# Patient Record
Sex: Male | Born: 1965 | Race: White | Hispanic: No | Smoking: Former smoker
Health system: Southern US, Community
[De-identification: ages and names within clinical notes are randomized; demographics above are authoritative.]

## PROBLEM LIST (undated history)

## (undated) DIAGNOSIS — F419 Anxiety disorder, unspecified: Secondary | ICD-10-CM

## (undated) DIAGNOSIS — M1990 Unspecified osteoarthritis, unspecified site: Secondary | ICD-10-CM

## (undated) DIAGNOSIS — H9313 Tinnitus, bilateral: Secondary | ICD-10-CM

## (undated) DIAGNOSIS — M719 Bursopathy, unspecified: Secondary | ICD-10-CM

## (undated) DIAGNOSIS — S060X9A Concussion with loss of consciousness of unspecified duration, initial encounter: Secondary | ICD-10-CM

## (undated) DIAGNOSIS — T8859XA Other complications of anesthesia, initial encounter: Secondary | ICD-10-CM

## (undated) DIAGNOSIS — I10 Essential (primary) hypertension: Secondary | ICD-10-CM

## (undated) DIAGNOSIS — S060XAA Concussion with loss of consciousness status unknown, initial encounter: Secondary | ICD-10-CM

## (undated) DIAGNOSIS — T4145XA Adverse effect of unspecified anesthetic, initial encounter: Secondary | ICD-10-CM

## (undated) DIAGNOSIS — S129XXA Fracture of neck, unspecified, initial encounter: Secondary | ICD-10-CM

## (undated) DIAGNOSIS — I4819 Other persistent atrial fibrillation: Secondary | ICD-10-CM

## (undated) DIAGNOSIS — G4700 Insomnia, unspecified: Secondary | ICD-10-CM

## (undated) HISTORY — PX: ANKLE SURGERY: SHX546

## (undated) HISTORY — PX: LEG SURGERY: SHX1003

## (undated) HISTORY — PX: CERVICAL SPINE SURGERY: SHX589

## (undated) HISTORY — DX: Other persistent atrial fibrillation: I48.19

## (undated) HISTORY — PX: KNEE ARTHROSCOPY: SUR90

## (undated) HISTORY — PX: WRIST SURGERY: SHX841

## (undated) HISTORY — PX: OTHER SURGICAL HISTORY: SHX169

## (undated) HISTORY — PX: HIP SURGERY: SHX245

## (undated) HISTORY — DX: Unspecified osteoarthritis, unspecified site: M19.90

## (undated) HISTORY — PX: HAND SURGERY: SHX662

---

## 2016-03-30 ENCOUNTER — Other Ambulatory Visit: Payer: Self-pay | Admitting: Orthopaedic Surgery

## 2016-04-28 ENCOUNTER — Other Ambulatory Visit (HOSPITAL_COMMUNITY): Payer: Self-pay

## 2016-04-28 NOTE — Pre-Procedure Instructions (Signed)
Jadene PieriniScott Nakanishi  04/28/2016     Your procedure is scheduled on : Tuesday May 10, 2016 at 1:10 PM.  [facility] at 11:10 AM.  Call this number if you have problems the morning of surgery: [phone]    Remember:  Do not eat food or drink liquids after midnight.  Take these medicines the morning of surgery with A SIP OF WATER : Amlodipine (Norvasc), Clonidine (Catapres), Hydralazine (Apresoline), Labetaolol (Normodyne), Oxycodone if needed   Stop taking any vitamins, herbal medications/supplements, NSAIDs, Ibuprofen, Advil, Motrin, Aleve/Naproxen, etc on Tuesday August 22nd   Do not wear jewelry.  Do not wear lotions, powders, or cologne.    Men may shave face and neck.  Do not bring valuables to the hospital.  [facility] is not responsible for any belongings or valuables.  Contacts, dentures or bridgework may not be worn into surgery.  Leave your suitcase in the car.  After surgery it may be brought to your room.  For patients admitted to the hospital, discharge time will be determined by your treatment team.  Patients discharged the day of surgery will not be allowed to drive home.   Name and phone number of your driver:    Special instructions:  Shower using CHG soap the night before and the morning of your surgery  Please read over the following fact sheets that you were given. Pain Booklet, Total Joint Packet, MRSA Information and Surgical Site Infection Prevention

## 2016-04-29 ENCOUNTER — Encounter (HOSPITAL_COMMUNITY)
Admission: RE | Admit: 2016-04-29 | Discharge: 2016-04-29 | Disposition: A | Discharge status: Home / Self Care | Payer: Medicaid Other | Source: Ambulatory Visit | Attending: Orthopaedic Surgery | Admitting: Orthopaedic Surgery

## 2016-04-29 ENCOUNTER — Ambulatory Visit (HOSPITAL_COMMUNITY)
Admission: RE | Admit: 2016-04-29 | Discharge: 2016-04-29 | Disposition: A | Discharge status: Home / Self Care | Payer: Medicaid Other | Source: Ambulatory Visit | Attending: Orthopaedic Surgery | Admitting: Orthopaedic Surgery

## 2016-04-29 ENCOUNTER — Encounter (HOSPITAL_COMMUNITY): Payer: Self-pay

## 2016-04-29 ENCOUNTER — Other Ambulatory Visit: Payer: Self-pay

## 2016-04-29 DIAGNOSIS — Z01812 Encounter for preprocedural laboratory examination: Secondary | ICD-10-CM | POA: Diagnosis not present

## 2016-04-29 DIAGNOSIS — R918 Other nonspecific abnormal finding of lung field: Secondary | ICD-10-CM | POA: Insufficient documentation

## 2016-04-29 DIAGNOSIS — I10 Essential (primary) hypertension: Secondary | ICD-10-CM | POA: Insufficient documentation

## 2016-04-29 DIAGNOSIS — Z01818 Encounter for other preprocedural examination: Secondary | ICD-10-CM

## 2016-04-29 DIAGNOSIS — M1612 Unilateral primary osteoarthritis, left hip: Secondary | ICD-10-CM | POA: Diagnosis not present

## 2016-04-29 DIAGNOSIS — F17200 Nicotine dependence, unspecified, uncomplicated: Secondary | ICD-10-CM | POA: Diagnosis not present

## 2016-04-29 DIAGNOSIS — Z79899 Other long term (current) drug therapy: Secondary | ICD-10-CM | POA: Diagnosis not present

## 2016-04-29 DIAGNOSIS — I517 Cardiomegaly: Secondary | ICD-10-CM | POA: Diagnosis not present

## 2016-04-29 DIAGNOSIS — Z0183 Encounter for blood typing: Secondary | ICD-10-CM | POA: Diagnosis not present

## 2016-04-29 HISTORY — DX: Bursopathy, unspecified: M71.9

## 2016-04-29 HISTORY — DX: Anxiety disorder, unspecified: F41.9

## 2016-04-29 HISTORY — DX: Fracture of neck, unspecified, initial encounter: S12.9XXA

## 2016-04-29 HISTORY — DX: Insomnia, unspecified: G47.00

## 2016-04-29 HISTORY — DX: Essential (primary) hypertension: I10

## 2016-04-29 HISTORY — DX: Unspecified osteoarthritis, unspecified site: M19.90

## 2016-04-29 LAB — CBC WITH DIFFERENTIAL/PLATELET
BASOS ABS: 0.1 10*3/uL (ref 0.0–0.1)
Basophils Relative: 2 %
EOS ABS: 0.3 10*3/uL (ref 0.0–0.7)
Eosinophils Relative: 5 %
HCT: 37.2 % — ABNORMAL LOW (ref 39.0–52.0)
HEMOGLOBIN: 13.2 g/dL (ref 13.0–17.0)
LYMPHS PCT: 28 %
Lymphs Abs: 1.6 10*3/uL (ref 0.7–4.0)
MCH: 31.9 pg (ref 26.0–34.0)
MCHC: 35.5 g/dL (ref 30.0–36.0)
MCV: 89.9 fL (ref 78.0–100.0)
MONO ABS: 0.6 10*3/uL (ref 0.1–1.0)
Monocytes Relative: 10 %
NEUTROS ABS: 3.2 10*3/uL (ref 1.7–7.7)
NEUTROS PCT: 55 %
PLATELETS: 216 10*3/uL (ref 150–400)
RBC: 4.14 MIL/uL — ABNORMAL LOW (ref 4.22–5.81)
RDW: 13.2 % (ref 11.5–15.5)
WBC: 5.8 10*3/uL (ref 4.0–10.5)

## 2016-04-29 LAB — BASIC METABOLIC PANEL
ANION GAP: 11 (ref 5–15)
BUN: 9 mg/dL (ref 6–20)
CALCIUM: 9.7 mg/dL (ref 8.9–10.3)
CO2: 24 mmol/L (ref 22–32)
CREATININE: 0.78 mg/dL (ref 0.61–1.24)
Chloride: 100 mmol/L — ABNORMAL LOW (ref 101–111)
Glucose, Bld: 90 mg/dL (ref 65–99)
Potassium: 3.7 mmol/L (ref 3.5–5.1)
Sodium: 135 mmol/L (ref 135–145)

## 2016-04-29 LAB — URINALYSIS, ROUTINE W REFLEX MICROSCOPIC
Bilirubin Urine: NEGATIVE
GLUCOSE, UA: NEGATIVE mg/dL
Hgb urine dipstick: NEGATIVE
KETONES UR: NEGATIVE mg/dL
LEUKOCYTES UA: NEGATIVE
NITRITE: NEGATIVE
PH: 6.5 (ref 5.0–8.0)
Protein, ur: NEGATIVE mg/dL
SPECIFIC GRAVITY, URINE: 1.009 (ref 1.005–1.030)

## 2016-04-29 LAB — PROTIME-INR
INR: 0.94
PROTHROMBIN TIME: 12.6 s (ref 11.4–15.2)

## 2016-04-29 LAB — TYPE AND SCREEN
ABO/RH(D): O POS
Antibody Screen: NEGATIVE

## 2016-04-29 LAB — APTT: APTT: 32 s (ref 24–36)

## 2016-04-29 LAB — SURGICAL PCR SCREEN
MRSA, PCR: NEGATIVE
STAPHYLOCOCCUS AUREUS: POSITIVE — AB

## 2016-04-29 LAB — ABO/RH: ABO/RH(D): O POS

## 2016-04-29 NOTE — Pre-Procedure Instructions (Signed)
Peter Thompson  04/29/2016    [facility] at 11:10 AM  Your procedure is scheduled on : Tuesday May 10, 2016 at 1:10 PM.    Call this number if you have problems the morning of surgery: [phone]              For any other questions, please call [phone], Monday - Friday 8 AM - 4 PM.    Remember:  Do not eat food or drink liquids after midnight.  Take these medicines the morning of surgery with A SIP OF WATER : Amlodipine (Norvasc), Clonidine (Catapres), Hydralazine (Apresoline), Labetaolol (Normodyne), Oxycodone if needed   Stop taking any vitamins, herbal medications/supplements, NSAIDs, Ibuprofen, Advil, Motrin, Aleve/Naproxen, etc on Tuesday August 22nd   Do not wear jewelry.  Do not wear lotions, powders, or cologne.    Men may shave face and neck.  Do not bring valuables to the hospital.  [facility] is not responsible for any belongings or valuables.  Contacts, dentures or bridgework may not be worn into surgery.  Leave your suitcase in the car.  After surgery it may be brought to your room.  For patients admitted to the hospital, discharge time will be determined by your treatment team.  Patients discharged the day of surgery will not be allowed to drive home.   Name and phone number of your driver:    Special instructions:  Shower using CHG soap the night before and the morning of your surgery  Please read over the following fact sheets that you were given. Pain Booklet, Total Joint Packet, MRSA Information and Surgical Site Infection Prevention

## 2016-04-29 NOTE — Progress Notes (Signed)
   04/29/16 1223  OBSTRUCTIVE SLEEP APNEA  Have you ever been diagnosed with sleep apnea through a sleep study? No  Do you snore loudly (loud enough to be heard through closed doors)?  1  Do you often feel tired, fatigued, or sleepy during the daytime (such as falling asleep during driving or talking to someone)? 0  Has anyone observed you stop breathing during your sleep? 0  Do you have, or are you being treated for high blood pressure? 1  BMI more than 35 kg/m2? 1  Age > 50 (1-yes) 0  Neck circumference greater than:Male 16 inches or larger, Male 17inches or larger? 1 45(17.5)  Male Gender (Yes=1) 1  Obstructive Sleep Apnea Score 5  Score 5 or greater  Results sent to PCP

## 2016-04-29 NOTE — Progress Notes (Signed)
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  Patient, "forgets at times" and repeats himself frequently.  Mr Peter Thompson reports that a blood vessel burst in his head in January 2017 and that he lost a large amount of blood from his nose.  Patient denies receiving any blood transfusions or being transferred to another facility.  Mr Labrum's PCP is Dr Rosezena SensorSammie Hussain, [place] , [place]. The PCP 's office is closed today and I was unable to obtain a fax number, we will try again on Monday.

## 2016-04-29 NOTE — Progress Notes (Signed)
I called a prescription for Mupirocin ointment to [facility].

## 2016-05-02 NOTE — Progress Notes (Signed)
Anesthesia Chart Review:  Pt is a 50 year old male scheduled for L total hip arthroplasty anterior approach on 05/10/2016 with Marcene CorningPeter Dalldorf, MD.   PMH includes:  HTN. Current smoker. BMI 35  Medications include: amlodipine, clonidine, hydralazine, labetalol, lisinopril-hctz, potassium.   Preoperative labs reviewed.    chest X-ray 04/29/16: Cardiomegaly with mild pulmonary venous congestion. Basilar pleural-parenchymal thickening noted most likely secondary to scarring.  EKG 04/29/16: Sinus bradycardia (58 bpm)  If no changes, I anticipate pt can proceed with surgery as scheduled.   Rica Mastngela , FNP-BC [facility]/Anesthesiology Phone: [phone] 05/02/2016 4:45 PM

## 2016-05-09 MED ORDER — LACTATED RINGERS IV SOLN
INTRAVENOUS | Status: DC
  Administered 2016-05-10 (×2): via INTRAVENOUS

## 2016-05-09 MED ORDER — CEFAZOLIN SODIUM-DEXTROSE 2-4 GM/100ML-% IV SOLN
2.0000 g | INTRAVENOUS | Status: AC
  Administered 2016-05-10: 2 g via INTRAVENOUS
  Filled 2016-05-09: qty 100

## 2016-05-09 NOTE — H&P (Signed)
TOTAL HIP ADMISSION H&P  Patient is admitted for left total hip arthroplasty.  Subjective:  Chief Complaint: left hip pain  HPI: Peter Thompson, 50 y.o. male, has a history of pain and functional disability in the left hip(s) due to arthritis and patient has failed non-surgical conservative treatments for greater than 12 weeks to include NSAID's and/or analgesics, flexibility and strengthening excercises, use of assistive devices, weight reduction as appropriate and activity modification.  Onset of symptoms was gradual starting 5 years ago with gradually worsening course since that time.The patient noted no past surgery on the left hip(s).  Patient currently rates pain in the left hip at 10 out of 10 with activity. Patient has night pain, worsening of pain with activity and weight bearing, trendelenberg gait, pain that interfers with activities of daily living and crepitus. Patient has evidence of subchondral cysts, subchondral sclerosis, periarticular osteophytes and joint space narrowing by imaging studies. This condition presents safety issues increasing the risk of falls.  There is no current active infection.  There are no active problems to display for this patient.  Past Medical History:  Diagnosis Date  . Anxiety   . Arthritis   . Bursitis   . Hypertension   . Insomnia   . Neck fracture [facility])     Past Surgical History:  Procedure Laterality Date  . Arm surgery Right    fracture repair  . CERVICAL SPINE SURGERY     bone graft from left hip  . HAND SURGERY Left    drains for Infection  . HIP SURGERY Left    "scrapped"  . KNEE ARTHROSCOPY Right   . LEG SURGERY Left    "1 inch took out"  . WRIST SURGERY Right    2 Pinns    No prescriptions prior to admission.   Allergies  Allergen Reactions  . No Known Allergies     Social History  Substance Use Topics  . Smoking status: Current Every Day Smoker    Packs/day: 0.50    Years: 56.00  . Smokeless tobacco: Current User     Types: Chew     Comment: occasional - chew  . Alcohol use Not on file    No family history on file.   Review of Systems  Musculoskeletal: Positive for joint pain.       Left hip  All other systems reviewed and are negative.   Objective:  Physical Exam  Constitutional: He is oriented to person, place, and time. He appears well-developed and well-nourished.  HENT:  Head: Normocephalic and atraumatic.  Eyes: Pupils are equal, round, and reactive to light.  Neck: Normal range of motion.  Cardiovascular: Normal rate and regular rhythm.   Respiratory: Effort normal.  GI: Soft.  Musculoskeletal:  Left hip has really no rotation.  He has good forward flexion with about a 5 hip flexion contracture.  He has 2 scars up near the iliac crest one fairly anterior one posterior.  There are no scars down by his hip joint.  Leg does seem a bit short compared to the opposite side.  Neurological: He is alert and oriented to person, place, and time.  Skin: Skin is warm and dry.  Psychiatric: He has a normal mood and affect. His behavior is normal. Judgment and thought content normal.    Vital signs in last 24 hours:    Labs:   Estimated body mass index is 35.38 kg/m as calculated from the following:   Height as of 04/29/16: 5\' 9"  (  1.753 m).   Weight as of 04/29/16: 108.7 kg (239 lb 9.6 oz).   Imaging Review Plain radiographs demonstrate severe degenerative joint disease of the left hip(s). The bone quality appears to be good for age and reported activity level.  Assessment/Plan:  End stage primary arthritis, left hip(s)  The patient history, physical examination, clinical judgement of the provider and imaging studies are consistent with end stage degenerative joint disease of the left hip(s) and total hip arthroplasty is deemed medically necessary. The treatment options including medical management, injection therapy, arthroscopy and arthroplasty were discussed at length. The risks and  benefits of total hip arthroplasty were presented and reviewed. The risks due to aseptic loosening, infection, stiffness, dislocation/subluxation,  thromboembolic complications and other imponderables were discussed.  The patient acknowledged the explanation, agreed to proceed with the plan and consent was signed. Patient is being admitted for inpatient treatment for surgery, pain control, PT, OT, prophylactic antibiotics, VTE prophylaxis, progressive ambulation and ADL's and discharge planning.The patient is planning to be discharged home with home health services

## 2016-05-10 ENCOUNTER — Inpatient Hospital Stay (HOSPITAL_COMMUNITY)
Admission: RE | Admit: 2016-05-10 | Discharge: 2016-05-12 | DRG: 470 | Disposition: A | Discharge status: Home / Self Care | Payer: Medicaid Other | Source: Ambulatory Visit | Attending: Orthopaedic Surgery | Admitting: Orthopaedic Surgery

## 2016-05-10 ENCOUNTER — Inpatient Hospital Stay (HOSPITAL_COMMUNITY): Payer: Medicaid Other

## 2016-05-10 ENCOUNTER — Inpatient Hospital Stay (HOSPITAL_COMMUNITY): Payer: Medicaid Other | Admitting: Certified Registered"

## 2016-05-10 ENCOUNTER — Encounter (HOSPITAL_COMMUNITY): Payer: Self-pay | Admitting: General Practice

## 2016-05-10 ENCOUNTER — Inpatient Hospital Stay (HOSPITAL_COMMUNITY): Payer: Medicaid Other | Admitting: Emergency Medicine

## 2016-05-10 ENCOUNTER — Encounter (HOSPITAL_COMMUNITY)
Admission: RE | Disposition: A | Discharge status: Home / Self Care | Payer: Self-pay | Source: Ambulatory Visit | Attending: Orthopaedic Surgery

## 2016-05-10 DIAGNOSIS — I10 Essential (primary) hypertension: Secondary | ICD-10-CM | POA: Diagnosis present

## 2016-05-10 DIAGNOSIS — Z79899 Other long term (current) drug therapy: Secondary | ICD-10-CM | POA: Diagnosis not present

## 2016-05-10 DIAGNOSIS — F17210 Nicotine dependence, cigarettes, uncomplicated: Secondary | ICD-10-CM | POA: Diagnosis present

## 2016-05-10 DIAGNOSIS — F419 Anxiety disorder, unspecified: Secondary | ICD-10-CM | POA: Diagnosis present

## 2016-05-10 DIAGNOSIS — M1612 Unilateral primary osteoarthritis, left hip: Principal | ICD-10-CM | POA: Diagnosis present

## 2016-05-10 DIAGNOSIS — Z419 Encounter for procedure for purposes other than remedying health state, unspecified: Secondary | ICD-10-CM

## 2016-05-10 HISTORY — DX: Unilateral primary osteoarthritis, left hip: M16.12

## 2016-05-10 HISTORY — DX: Unspecified osteoarthritis, unspecified site: M19.90

## 2016-05-10 HISTORY — PX: TOTAL HIP ARTHROPLASTY: SHX124

## 2016-05-10 SURGERY — ARTHROPLASTY, HIP, TOTAL, ANTERIOR APPROACH
Anesthesia: Spinal | Site: Hip | Laterality: Left

## 2016-05-10 MED ORDER — ALUM & MAG HYDROXIDE-SIMETH 200-200-20 MG/5ML PO SUSP: 30.0000 mL | ORAL | Status: DC | PRN

## 2016-05-10 MED ORDER — METHOCARBAMOL 500 MG PO TABS
ORAL_TABLET | ORAL | Status: AC
  Administered 2016-05-10: 500 mg
  Filled 2016-05-10: qty 1

## 2016-05-10 MED ORDER — DIPHENHYDRAMINE HCL 12.5 MG/5ML PO ELIX: 12.5000 mg | ORAL_SOLUTION | ORAL | Status: DC | PRN

## 2016-05-10 MED ORDER — ACETAMINOPHEN 650 MG RE SUPP: 650.0000 mg | Freq: Four times a day (QID) | RECTAL | Status: DC | PRN

## 2016-05-10 MED ORDER — BUPIVACAINE LIPOSOME 1.3 % IJ SUSP
20.0000 mL | INTRAMUSCULAR | Status: AC
  Administered 2016-05-10: 20 mL
  Filled 2016-05-10: qty 20

## 2016-05-10 MED ORDER — LIDOCAINE 2% (20 MG/ML) 5 ML SYRINGE
INTRAMUSCULAR | Status: AC
  Filled 2016-05-10: qty 5

## 2016-05-10 MED ORDER — METOCLOPRAMIDE HCL 5 MG PO TABS: 5.0000 mg | ORAL_TABLET | Freq: Three times a day (TID) | ORAL | Status: DC | PRN

## 2016-05-10 MED ORDER — CHLORHEXIDINE GLUCONATE 4 % EX LIQD: 60.0000 mL | Freq: Once | CUTANEOUS | Status: DC

## 2016-05-10 MED ORDER — POTASSIUM CHLORIDE ER 10 MEQ PO TBCR
10.0000 meq | EXTENDED_RELEASE_TABLET | Freq: Every day | ORAL | Status: DC
  Administered 2016-05-10 – 2016-05-12 (×3): 10 meq via ORAL
  Filled 2016-05-10 (×6): qty 1

## 2016-05-10 MED ORDER — MIDAZOLAM HCL 2 MG/2ML IJ SOLN
INTRAMUSCULAR | Status: AC
  Filled 2016-05-10: qty 2

## 2016-05-10 MED ORDER — HYDROMORPHONE HCL 1 MG/ML IJ SOLN
0.5000 mg | INTRAMUSCULAR | Status: DC | PRN
  Administered 2016-05-10 – 2016-05-11 (×4): 1 mg via INTRAVENOUS
  Filled 2016-05-10 (×4): qty 1

## 2016-05-10 MED ORDER — OXYCODONE HCL 5 MG PO TABS
15.0000 mg | ORAL_TABLET | ORAL | Status: DC | PRN
  Administered 2016-05-10 – 2016-05-12 (×12): 30 mg via ORAL
  Filled 2016-05-10 (×12): qty 6

## 2016-05-10 MED ORDER — BUPIVACAINE-EPINEPHRINE (PF) 0.25% -1:200000 IJ SOLN
INTRAMUSCULAR | Status: AC
  Filled 2016-05-10: qty 30

## 2016-05-10 MED ORDER — MIDAZOLAM HCL 5 MG/5ML IJ SOLN
INTRAMUSCULAR | Status: DC | PRN
  Administered 2016-05-10: 2 mg via INTRAVENOUS

## 2016-05-10 MED ORDER — MENTHOL 3 MG MT LOZG: 1.0000 | LOZENGE | OROMUCOSAL | Status: DC | PRN

## 2016-05-10 MED ORDER — AMLODIPINE BESYLATE 10 MG PO TABS
10.0000 mg | ORAL_TABLET | Freq: Every day | ORAL | Status: DC
  Administered 2016-05-10 – 2016-05-12 (×2): 10 mg via ORAL
  Filled 2016-05-10 (×3): qty 1

## 2016-05-10 MED ORDER — PROPOFOL 10 MG/ML IV BOLUS
INTRAVENOUS | Status: DC | PRN
  Administered 2016-05-10: 40 mg via INTRAVENOUS
  Administered 2016-05-10: 30 mg via INTRAVENOUS
  Administered 2016-05-10 (×3): 20 mg via INTRAVENOUS

## 2016-05-10 MED ORDER — ONDANSETRON HCL 4 MG PO TABS: 4.0000 mg | ORAL_TABLET | Freq: Four times a day (QID) | ORAL | Status: DC | PRN

## 2016-05-10 MED ORDER — DEXTROSE 5 % IV SOLN
INTRAVENOUS | Status: DC | PRN
  Administered 2016-05-10: 10 ug/min via INTRAVENOUS

## 2016-05-10 MED ORDER — FENTANYL CITRATE (PF) 100 MCG/2ML IJ SOLN
INTRAMUSCULAR | Status: AC
  Filled 2016-05-10: qty 2

## 2016-05-10 MED ORDER — LISINOPRIL-HYDROCHLOROTHIAZIDE 20-25 MG PO TABS: 1.0000 | ORAL_TABLET | Freq: Every day | ORAL | Status: DC

## 2016-05-10 MED ORDER — ALPRAZOLAM 0.5 MG PO TABS
1.0000 mg | ORAL_TABLET | Freq: Every day | ORAL | Status: DC
  Administered 2016-05-10 – 2016-05-11 (×2): 1 mg via ORAL
  Filled 2016-05-10 (×2): qty 2

## 2016-05-10 MED ORDER — SODIUM CHLORIDE 0.9 % IV SOLN
1000.0000 mg | Freq: Once | INTRAVENOUS | Status: AC
  Administered 2016-05-10: 1000 mg via INTRAVENOUS
  Filled 2016-05-10: qty 10

## 2016-05-10 MED ORDER — LACTATED RINGERS IV SOLN: INTRAVENOUS | Status: DC

## 2016-05-10 MED ORDER — ONDANSETRON HCL 4 MG/2ML IJ SOLN
INTRAMUSCULAR | Status: DC | PRN
  Administered 2016-05-10: 4 mg via INTRAVENOUS

## 2016-05-10 MED ORDER — BISACODYL 5 MG PO TBEC: 5.0000 mg | DELAYED_RELEASE_TABLET | Freq: Every day | ORAL | Status: DC | PRN

## 2016-05-10 MED ORDER — ONDANSETRON HCL 4 MG/2ML IJ SOLN
INTRAMUSCULAR | Status: AC
  Filled 2016-05-10: qty 2

## 2016-05-10 MED ORDER — CLONIDINE HCL 0.2 MG PO TABS
0.2000 mg | ORAL_TABLET | Freq: Three times a day (TID) | ORAL | Status: DC
  Administered 2016-05-10 – 2016-05-12 (×4): 0.2 mg via ORAL
  Filled 2016-05-10 (×5): qty 1

## 2016-05-10 MED ORDER — SODIUM CHLORIDE 0.9 % IV SOLN
INTRAVENOUS | Status: DC | PRN
  Administered 2016-05-10: 2000 mg via TOPICAL

## 2016-05-10 MED ORDER — LISINOPRIL 20 MG PO TABS
20.0000 mg | ORAL_TABLET | Freq: Every day | ORAL | Status: DC
  Administered 2016-05-10 – 2016-05-12 (×2): 20 mg via ORAL
  Filled 2016-05-10 (×3): qty 1

## 2016-05-10 MED ORDER — HYDROMORPHONE HCL 1 MG/ML IJ SOLN
INTRAMUSCULAR | Status: AC
  Filled 2016-05-10: qty 1

## 2016-05-10 MED ORDER — METHOCARBAMOL 1000 MG/10ML IJ SOLN
500.0000 mg | Freq: Four times a day (QID) | INTRAVENOUS | Status: DC | PRN
  Filled 2016-05-10: qty 5

## 2016-05-10 MED ORDER — HYDROCHLOROTHIAZIDE 25 MG PO TABS
25.0000 mg | ORAL_TABLET | Freq: Every day | ORAL | Status: DC
  Administered 2016-05-10 – 2016-05-12 (×2): 25 mg via ORAL
  Filled 2016-05-10 (×3): qty 1

## 2016-05-10 MED ORDER — PROPOFOL 1000 MG/100ML IV EMUL
INTRAVENOUS | Status: AC
  Filled 2016-05-10: qty 300

## 2016-05-10 MED ORDER — ONDANSETRON HCL 4 MG/2ML IJ SOLN: 4.0000 mg | Freq: Four times a day (QID) | INTRAMUSCULAR | Status: DC | PRN

## 2016-05-10 MED ORDER — FENTANYL CITRATE (PF) 100 MCG/2ML IJ SOLN
50.0000 ug | Freq: Once | INTRAMUSCULAR | Status: AC
  Administered 2016-05-10: 50 ug via INTRAVENOUS

## 2016-05-10 MED ORDER — OXYCODONE HCL 5 MG PO TABS
ORAL_TABLET | ORAL | Status: AC
  Filled 2016-05-10: qty 6

## 2016-05-10 MED ORDER — FENTANYL CITRATE (PF) 100 MCG/2ML IJ SOLN
INTRAMUSCULAR | Status: DC | PRN
  Administered 2016-05-10 (×2): 50 ug via INTRAVENOUS

## 2016-05-10 MED ORDER — HYDRALAZINE HCL 50 MG PO TABS
100.0000 mg | ORAL_TABLET | Freq: Three times a day (TID) | ORAL | Status: DC
  Administered 2016-05-10 – 2016-05-12 (×4): 100 mg via ORAL
  Filled 2016-05-10 (×5): qty 2

## 2016-05-10 MED ORDER — ACETAMINOPHEN 325 MG PO TABS: 650.0000 mg | ORAL_TABLET | Freq: Four times a day (QID) | ORAL | Status: DC | PRN

## 2016-05-10 MED ORDER — PROPOFOL 500 MG/50ML IV EMUL
INTRAVENOUS | Status: DC | PRN
  Administered 2016-05-10: 100 ug/kg/min via INTRAVENOUS

## 2016-05-10 MED ORDER — LIDOCAINE HCL (CARDIAC) 20 MG/ML IV SOLN
INTRAVENOUS | Status: DC | PRN
  Administered 2016-05-10 (×2): 20 mg via INTRAVENOUS

## 2016-05-10 MED ORDER — CEFAZOLIN SODIUM-DEXTROSE 2-4 GM/100ML-% IV SOLN
2.0000 g | Freq: Four times a day (QID) | INTRAVENOUS | Status: AC
  Administered 2016-05-11: 2 g via INTRAVENOUS
  Filled 2016-05-10: qty 100

## 2016-05-10 MED ORDER — ASPIRIN EC 325 MG PO TBEC
325.0000 mg | DELAYED_RELEASE_TABLET | Freq: Two times a day (BID) | ORAL | Status: DC
  Administered 2016-05-11 – 2016-05-12 (×4): 325 mg via ORAL
  Filled 2016-05-10 (×4): qty 1

## 2016-05-10 MED ORDER — DOCUSATE SODIUM 100 MG PO CAPS
100.0000 mg | ORAL_CAPSULE | Freq: Two times a day (BID) | ORAL | Status: DC
  Administered 2016-05-10 – 2016-05-12 (×4): 100 mg via ORAL
  Filled 2016-05-10 (×4): qty 1

## 2016-05-10 MED ORDER — LABETALOL HCL 300 MG PO TABS
150.0000 mg | ORAL_TABLET | Freq: Two times a day (BID) | ORAL | Status: DC
  Administered 2016-05-10 – 2016-05-12 (×3): 150 mg via ORAL
  Filled 2016-05-10 (×5): qty 0.5

## 2016-05-10 MED ORDER — CEFAZOLIN SODIUM-DEXTROSE 2-4 GM/100ML-% IV SOLN
2.0000 g | Freq: Four times a day (QID) | INTRAVENOUS | Status: DC
  Administered 2016-05-10: 2 g via INTRAVENOUS
  Filled 2016-05-10 (×2): qty 100

## 2016-05-10 MED ORDER — METHOCARBAMOL 500 MG PO TABS
500.0000 mg | ORAL_TABLET | Freq: Four times a day (QID) | ORAL | Status: DC | PRN
  Administered 2016-05-10 – 2016-05-12 (×6): 500 mg via ORAL
  Filled 2016-05-10 (×6): qty 1

## 2016-05-10 MED ORDER — PHENOL 1.4 % MT LIQD: 1.0000 | OROMUCOSAL | Status: DC | PRN

## 2016-05-10 MED ORDER — METOCLOPRAMIDE HCL 5 MG/ML IJ SOLN: 5.0000 mg | Freq: Three times a day (TID) | INTRAMUSCULAR | Status: DC | PRN

## 2016-05-10 MED ORDER — HYDROMORPHONE HCL 1 MG/ML IJ SOLN
0.5000 mg | INTRAMUSCULAR | Status: DC | PRN
  Administered 2016-05-10 (×4): 0.5 mg via INTRAVENOUS

## 2016-05-10 MED ORDER — TRANEXAMIC ACID 1000 MG/10ML IV SOLN
2000.0000 mg | INTRAVENOUS | Status: DC
  Filled 2016-05-10: qty 20

## 2016-05-10 MED ORDER — BUPIVACAINE-EPINEPHRINE (PF) 0.5% -1:200000 IJ SOLN: INTRAMUSCULAR | Status: DC | PRN

## 2016-05-10 MED ORDER — 0.9 % SODIUM CHLORIDE (POUR BTL) OPTIME
TOPICAL | Status: DC | PRN
Start: 2016-05-10 — End: 2016-05-10
  Administered 2016-05-10: 1000 mL

## 2016-05-10 MED ORDER — BUPIVACAINE IN DEXTROSE 0.75-8.25 % IT SOLN
INTRATHECAL | Status: DC | PRN
  Administered 2016-05-10: 2 mL via INTRATHECAL

## 2016-05-10 MED ORDER — TRANEXAMIC ACID 1000 MG/10ML IV SOLN
1000.0000 mg | INTRAVENOUS | Status: AC
  Administered 2016-05-10: 1000 mg via INTRAVENOUS
  Filled 2016-05-10: qty 10

## 2016-05-10 MED ORDER — BUPIVACAINE-EPINEPHRINE 0.25% -1:200000 IJ SOLN
INTRAMUSCULAR | Status: DC | PRN
  Administered 2016-05-10: 20 mL

## 2016-05-10 SURGICAL SUPPLY — 51 items
BLADE SAW SGTL 18X1.27X75 (BLADE) ×2 IMPLANT
BLADE SAW SGTL 18X1.27X75MM (BLADE) ×1
BLADE SURG ROTATE 9660 (MISCELLANEOUS) IMPLANT
CAPT HIP TOTAL 2 ×3 IMPLANT
CELLS DAT CNTRL 66122 CELL SVR (MISCELLANEOUS) ×1 IMPLANT
CLOSURE WOUND 1/2 X4 (GAUZE/BANDAGES/DRESSINGS) ×1
COVER PERINEAL POST (MISCELLANEOUS) ×3 IMPLANT
COVER SURGICAL LIGHT HANDLE (MISCELLANEOUS) ×3 IMPLANT
DRAPE C-ARM 42X72 X-RAY (DRAPES) ×3 IMPLANT
DRAPE IMP U-DRAPE 54X76 (DRAPES) ×3 IMPLANT
DRAPE STERI IOBAN 125X83 (DRAPES) ×3 IMPLANT
DRAPE U-SHAPE 47X51 STRL (DRAPES) ×9 IMPLANT
DRSG AQUACEL AG ADV 3.5X10 (GAUZE/BANDAGES/DRESSINGS) ×3 IMPLANT
DURAPREP 26ML APPLICATOR (WOUND CARE) ×3 IMPLANT
ELECT BLADE 4.0 EZ CLEAN MEGAD (MISCELLANEOUS) ×3
ELECT CAUTERY BLADE 6.4 (BLADE) ×3 IMPLANT
ELECT REM PT RETURN 9FT ADLT (ELECTROSURGICAL) ×3
ELECTRODE BLDE 4.0 EZ CLN MEGD (MISCELLANEOUS) ×1 IMPLANT
ELECTRODE REM PT RTRN 9FT ADLT (ELECTROSURGICAL) ×1 IMPLANT
FACESHIELD WRAPAROUND (MASK) ×6 IMPLANT
GLOVE BIO SURGEON STRL SZ8 (GLOVE) ×6 IMPLANT
GLOVE BIOGEL PI IND STRL 8 (GLOVE) ×2 IMPLANT
GLOVE BIOGEL PI INDICATOR 8 (GLOVE) ×4
GOWN STRL REUS W/ TWL LRG LVL3 (GOWN DISPOSABLE) ×1 IMPLANT
GOWN STRL REUS W/ TWL XL LVL3 (GOWN DISPOSABLE) ×2 IMPLANT
GOWN STRL REUS W/TWL LRG LVL3 (GOWN DISPOSABLE) ×2
GOWN STRL REUS W/TWL XL LVL3 (GOWN DISPOSABLE) ×4
KIT BASIN OR (CUSTOM PROCEDURE TRAY) ×3 IMPLANT
KIT ROOM TURNOVER OR (KITS) ×3 IMPLANT
MANIFOLD NEPTUNE II (INSTRUMENTS) ×3 IMPLANT
NEEDLE HYPO 22GX1.5 SAFETY (NEEDLE) ×3 IMPLANT
NS IRRIG 1000ML POUR BTL (IV SOLUTION) ×3 IMPLANT
PACK TOTAL JOINT (CUSTOM PROCEDURE TRAY) ×3 IMPLANT
PAD ARMBOARD 7.5X6 YLW CONV (MISCELLANEOUS) ×6 IMPLANT
RTRCTR WOUND ALEXIS 18CM MED (MISCELLANEOUS) ×3
STAPLER VISISTAT 35W (STAPLE) ×3 IMPLANT
STRIP CLOSURE SKIN 1/2X4 (GAUZE/BANDAGES/DRESSINGS) ×2 IMPLANT
SUT ETHIBOND NAB CT1 #1 30IN (SUTURE) ×6 IMPLANT
SUT MNCRL AB 3-0 PS2 18 (SUTURE) ×3 IMPLANT
SUT VIC AB 0 CT1 27 (SUTURE)
SUT VIC AB 0 CT1 27XBRD ANBCTR (SUTURE) IMPLANT
SUT VIC AB 1 CT1 27 (SUTURE) ×2
SUT VIC AB 1 CT1 27XBRD ANBCTR (SUTURE) ×1 IMPLANT
SUT VIC AB 2-0 CT1 27 (SUTURE) ×2
SUT VIC AB 2-0 CT1 TAPERPNT 27 (SUTURE) ×1 IMPLANT
SUT VLOC 180 0 24IN GS25 (SUTURE) ×3 IMPLANT
SYR 50ML LL SCALE MARK (SYRINGE) ×3 IMPLANT
TOWEL OR 17X24 6PK STRL BLUE (TOWEL DISPOSABLE) ×3 IMPLANT
TOWEL OR 17X26 10 PK STRL BLUE (TOWEL DISPOSABLE) ×6 IMPLANT
TRAY FOLEY CATH 14FR (SET/KITS/TRAYS/PACK) IMPLANT
WATER STERILE IRR 1000ML POUR (IV SOLUTION) IMPLANT

## 2016-05-10 NOTE — Transfer of Care (Addendum)
Immediate Anesthesia Transfer of Care Note  Patient: Peter Thompson  Procedure(s) Performed: Procedure(s): TOTAL HIP ARTHROPLASTY ANTERIOR APPROACH (Left)  Patient Location: PACU  Anesthesia Type:Spinal  Level of Consciousness:  sedated, patient cooperative and responds to stimulation  Airway & Oxygen Therapy:Patient Spontanous Breathing and Patient connected to face mask oxgen  Post-op Assessment:  Report given to PACU RN and Post -op Vital signs reviewed and stable  Post vital signs:  Reviewed and stable  Last Vitals:  Vitals:   05/10/16 0936  BP: 121/65  Pulse: 71  Resp: 20  Temp: 37.1 C    Complications: No apparent anesthesia complications

## 2016-05-10 NOTE — Interval H&P Note (Signed)
History and Physical Interval Note:  05/10/2016 11:18 AM  Peter PieriniScott Thompson  has presented today for surgery, with the diagnosis of LEFT HIP DEGENERATIVE JOINT DISEASE  The various methods of treatment have been discussed with the patient and family. After consideration of risks, benefits and other options for treatment, the patient has consented to  Procedure(s): TOTAL HIP ARTHROPLASTY ANTERIOR APPROACH (Left) as a surgical intervention .  The patient's history has been reviewed, patient examined, no change in status, stable for surgery.  I have reviewed the patient's chart and labs.  Questions were answered to the patient's satisfaction.     , G

## 2016-05-10 NOTE — Anesthesia Procedure Notes (Signed)
Spinal  Patient location during procedure: OR Start time: 05/10/2016 11:48 AM End time: 05/10/2016 11:50 AM Staffing Anesthesiologist: Suella Broad D Performed: anesthesiologist  Preanesthetic Checklist Completed: patient identified, site marked, surgical consent, pre-op evaluation, timeout performed, IV checked, risks and benefits discussed and monitors and equipment checked Spinal Block Patient position: sitting Prep: Betadine Patient monitoring: heart rate, continuous pulse ox, blood pressure and cardiac monitor Approach: midline Location: L4-5 Injection technique: single-shot Needle Needle type: Whitacre and Introducer  Needle gauge: 24 G Needle length: 9 cm Additional Notes Negative paresthesia. Negative blood return. Positive free-flowing CSF. Expiration date of kit checked and confirmed. Patient tolerated procedure well, without complications.

## 2016-05-10 NOTE — Op Note (Signed)
PRE-OP DIAGNOSIS:  LEFT HIP DEGENERATIVE JOINT DISEASE POST-OP DIAGNOSIS: same PROCEDURE:  LEFT TOTAL HIP ARTHROPLASTY ANTERIOR APPROACH ANESTHESIA:  Spinal and MAC SURGEON:  Marcene CorningPeter  MD ASSISTANT:  Elodia FlorenceAndrew Nida PA-C   INDICATIONS FOR PROCEDURE:  The patient is a 50 y.o. male with a long history of a painful hip.  This has persisted despite multiple conservative measures.  The patient has persisted with pain and dysfunction making rest and activity difficult.  A total hip replacement is offered as surgical treatment.  Informed operative consent was obtained after discussion of possible complications including reaction to anesthesia, infection, neurovascular injury, dislocation, DVT, PE, and death.  The importance of the postoperative rehab program to optimize result was stressed with the patient.  SUMMARY OF FINDINGS AND PROCEDURE:  Under general anesthesia through a anterior approach an the Hana table a left THR was performed.  The patient had severe degenerative change and excellent bone quality.  We used DePuy components to replace the hip and these were size KLA 13 Corail femur capped with a +12 36 mm ceramic hip ball.  On the acetabular side we used a size 54 Gription shell including 3 holes which I did not utilize with a plus 4 neutral polyethylene liner.  We did not use a hole eliminator.  Elodia FlorenceAndrew Nida PA-C assisted throughout and was invaluable to the completion of the case in that he helped position and retract while I performed the procedure.  He also closed simultaneously to help minimize OR time.  I used fluoroscopy throughout the case to check position of components and leg lengths and read all these views myself.  DESCRIPTION OF PROCEDURE:  The patient was taken to the OR suite where general anesthetic was applied.  The patient was then positioned on the Hana table supine.  All bony prominences were appropriately padded.  Prep and drape was then performed in normal sterile fashion.  The  patient was given kefzol preoperative antibiotic and an appropriate time out was performed.  We then took an anterior approach to the left hip.  Dissection was taken through adipose to the tensor fascia lata fascia.  This structure was incised longitudinally and we dissected in the intermuscular interval just medial to this muscle.  Cobra retractors were placed superior and inferior to the femoral neck superficial to the capsule.  A capsular incision was then made and the retractors were placed along the femoral neck.  Xray was brought in to get a good level for the femoral neck cut which was made with an oscillating saw and osteotome.  The femoral head was removed with a corkscrew.  The acetabulum was exposed and some labral tissues were excised. Reaming was taken to the inside wall of the pelvis and sequentially up to 1 mm smaller than the actual component.  A trial of components was done and then the aforementioned acetabular shell was placed in appropriate tilt and anteversion confirmed by fluoroscopy. The liner was placed along with the hole eliminator and attention was turned to the femur.  The leg was brought down and over into adduction and the elevator bar was used to raise the femur up gently in the wound.  The piriformis was released with care taken to preserve the obturator internus attachment and all of the posterior capsule. The femur was reamed and then broached to the appropriate size.  A trial reduction was done and the aforementioned head and neck assembly gave us the best stability in extension with external rotation.  Leg  lengths were felt to be about equal by fluoroscopic exam.  The trial components were removed and the wound irrigated.  We then placed the femoral component in appropriate anteversion.  The head was applied to a dry stem neck and the hip again reduced.  It was again stable in the aforementioned position.  The would was irrigated again followed by re-approximation of anterior  capsule with ethibond suture. Tensor fascia was repaired with V-loc suture  followed by deep closure with #O and #2 undyed vicryl.  Skin was closed with subQ stitch and steristrips followed by a sterile dressing.  EBL and IOF can be obtained from anesthesia records.  DISPOSITION:  The patient was extubated in the OR and taken to PACU in stable condition to be admitted to the Orthopedic Surgery for appropriate post-op care to include perioperative antibiotics and DVT prophylaxis.

## 2016-05-10 NOTE — Anesthesia Preprocedure Evaluation (Addendum)
Anesthesia Evaluation  Patient identified by MRN, date of birth, ID band Patient awake    Reviewed: Allergy & Precautions, NPO status , Patient's Chart, lab work & pertinent test results, reviewed documented beta blocker date and time   Airway Mallampati: II  TM Distance: >3 FB Neck ROM: Full    Dental  (+) Teeth Intact, Dental Advisory Given   Pulmonary Current Smoker,    breath sounds clear to auscultation       Cardiovascular hypertension, Pt. on home beta blockers and Pt. on medications  Rhythm:Regular Rate:Normal     Neuro/Psych PSYCHIATRIC DISORDERS Anxiety negative neurological ROS     GI/Hepatic negative GI ROS, Neg liver ROS,   Endo/Other  negative endocrine ROS  Renal/GU negative Renal ROS  negative genitourinary   Musculoskeletal  (+) Arthritis ,   Abdominal   Peds negative pediatric ROS (+)  Hematology negative hematology ROS (+)   Anesthesia Other Findings   Reproductive/Obstetrics negative OB ROS                            Anesthesia Physical Anesthesia Plan  ASA: III  Anesthesia Plan: Spinal   Post-op Pain Management:    Induction: Intravenous  Airway Management Planned: Natural Airway  Additional Equipment:   Intra-op Plan:   Post-operative Plan:   Informed Consent: I have reviewed the patients History and Physical, chart, labs and discussed the procedure including the risks, benefits and alternatives for the proposed anesthesia with the patient or authorized representative who has indicated his/her understanding and acceptance.     Plan Discussed with: CRNA  Anesthesia Plan Comments:        Anesthesia Quick Evaluation

## 2016-05-10 NOTE — Progress Notes (Signed)
Patient's last dose of Naproxen was in the morning on 05/09/16.  Patient states that until yesterday, he has been taking it twice a day.  Dr. Jerl Santosalldorf made aware.

## 2016-05-10 NOTE — Anesthesia Procedure Notes (Deleted)
Anesthesia Regional Block:  Adductor canal block  Pre-Anesthetic Checklist: ,, timeout performed, Correct Patient, Correct Site, Correct Laterality, Correct Procedure, Correct Position, site marked, Risks and benefits discussed,  Surgical consent,  Pre-op evaluation,  At surgeon's request and post-op pain management  Laterality: Left  Prep: chloraprep       Needles:  Injection technique: Single-shot  Needle Type: Echogenic Needle     Needle Length: 9cm 9 cm Needle Gauge: 21 and 21 G    Additional Needles:  Procedures: ultrasound guided (picture in chart) Adductor canal block Narrative:  Start time: 05/10/2016 10:46 AM End time: 05/10/2016 10:50 AM Injection made incrementally with aspirations every 5 mL.  Performed by: Personally  Anesthesiologist: Shona SimpsonHOLLIS, KEVIN D  Additional Notes: Pt tolerated well. No immediate complications noted.

## 2016-05-11 ENCOUNTER — Encounter (HOSPITAL_COMMUNITY): Payer: Self-pay | Admitting: General Practice

## 2016-05-11 NOTE — Progress Notes (Signed)
   05/11/16 1124  OT Visit Information  Last OT Received On 05/11/16  Assistance Needed +1  History of Present Illness Pt is a 50 y/o male s/p L THA, anterior approach. PMH including but not limited to HTN.  Precautions  Precautions Fall  Pain Assessment  Pain Assessment Faces  Faces Pain Scale 2  Pain Location L hip  Pain Intervention(s) Limited activity within patient's tolerance;Monitored during session;Premedicated before session;Repositioned  Cognition  Arousal/Alertness Awake/alert  Behavior During Therapy WFL for tasks assessed/performed  Overall Cognitive Status Within Functional Limits for tasks assessed  ADL  Lower Body Bathing Supervison/ safety;Sit to/from stand;With adaptive equipment  Lower Body Dressing Minimal assistance;Sit to/from stand;With adaptive equipment  General ADL Comments issued AE kit (minus sock aide, as pt did not feel he would use it).  Brought him to gym and showed options for use of 3:1 over tub.  He has a small tub and doesn't feel a tub bench will work nor placing 3:1 parallel to long side of tub.  He feels he may be able to straddle 3:1 over ledge.  Also educated on tub readiness. Wife arrived and was able to see the tub portion of this session  Restrictions  LLE Weight Bearing WBAT  Transfers  Equipment used Rolling walker (2 wheeled)  Sit to Stand Supervision  OT - End of Session  Activity Tolerance Patient tolerated treatment well  OT Assessment/Plan  Follow Up Recommendations Supervision - Intermittent  OT Equipment 3 in 1 bedside comode  OT Goal Progression  Progress towards OT goals Goals met/education completed, patient discharged from OT  Acute Rehab OT Goals  Patient Stated Goal return home  ADL Goals  Pt Will Perform Lower Body Bathing with supervision;with adaptive equipment;sit to/from stand  Pt Will Perform Lower Body Dressing with supervision;with adaptive equipment;sit to/from stand  Additional ADL Goal #1 pt will verbalize tub  options (3:1 vs tub bench/seat) vs readiness for stepping inside  OT Time Calculation  OT Start Time (ACUTE ONLY) 1132  OT Stop Time (ACUTE ONLY) 1152  OT Time Calculation (min) 20 min  OT General Charges  $OT Visit 1 Procedure  OT Treatments  $Self Care/Home Management  8-22 mins  Lesle Chris, OTR/L [phone] 05/11/2016

## 2016-05-11 NOTE — Progress Notes (Signed)
Subjective: 1 Day Post-Op Procedure(s) (LRB): TOTAL HIP ARTHROPLASTY ANTERIOR APPROACH (Left)  Activity level:  wbat Diet tolerance:  ok Voiding:  ok Patient reports pain as mild and moderate.    Objective: Vital signs in last 24 hours: Temp:  [97.4 F (36.3 C)-98.8 F (37.1 C)] 98.4 F (36.9 C) (08/30 0420) Pulse Rate:  [52-71] 63 (08/30 0420) Resp:  [10-20] 18 (08/30 0420) BP: (108-155)/(57-76) 109/58 (08/30 0420) SpO2:  [96 %-100 %] 99 % (08/30 0420) Weight:  [108.7 kg (239 lb 9.6 oz)] 108.7 kg (239 lb 9.6 oz) (08/29 0936)  Labs: No results for input(s): HGB in the last 72 hours. No results for input(s): WBC, RBC, HCT, PLT in the last 72 hours. No results for input(s): NA, K, CL, CO2, BUN, CREATININE, GLUCOSE, CALCIUM in the last 72 hours. No results for input(s): LABPT, INR in the last 72 hours.  Physical Exam:  Neurologically intact ABD soft Neurovascular intact Sensation intact distally Intact pulses distally Dorsiflexion/Plantar flexion intact Incision: dressing C/D/I and no drainage No cellulitis present Compartment soft  Assessment/Plan:  1 Day Post-Op Procedure(s) (LRB): TOTAL HIP ARTHROPLASTY ANTERIOR APPROACH (Left) Advance diet Up with therapy D/C IV fluids Plan for discharge tomorrow Discharge home with home health if doing well and cleared by PT. Continue on ASA 325 mg BID x 4 weeks post op. Follow up in office 2 weeks  , Ginger OrganNDREW [name] 05/11/2016, 7:46 AM

## 2016-05-11 NOTE — Evaluation (Signed)
Physical Therapy Evaluation Patient Details Name: Peter Thompson MRN: [id number] DOB: 27-Nov-1965 Today's Date: 05/11/2016   History of Present Illness  Pt is a 50 y/o male s/p L THA, anterior approach. PMH including but not limited to HTN.  Clinical Impression  Pt presented standing upright in front of his recliner in his room when PT entered. Pt was awake and willing to participate in therapy session. Pt able to perform STS with supervision and ambulate with min guard for safety, no physical assist needed. Pt would continue to benefit from skilled physical therapy services at this time while admitted and after d/c to address his below listed limitations in order to improve his overall safety and independence with functional mobility.      Follow Up Recommendations Home health PT;Supervision for mobility/OOB    Equipment Recommendations  Rolling walker with 5" wheels;3in1 (PT)    Recommendations for Other Services       Precautions / Restrictions Precautions Precautions: Fall Restrictions Weight Bearing Restrictions: Yes LLE Weight Bearing: Weight bearing as tolerated      Mobility  Bed Mobility               General bed mobility comments: pt standing up in front of recliner when PT entered room  Transfers Overall transfer level: Needs assistance Equipment used: Rolling walker (2 wheeled) Transfers: Sit to/from Stand Sit to Stand: Supervision         General transfer comment: pt required increased time  Ambulation/Gait Ambulation/Gait assistance: Min guard Ambulation Distance (Feet): 150 Feet Assistive device: Rolling walker (2 wheeled) Gait Pattern/deviations: Step-through pattern;Decreased step length - right;Decreased stance time - left;Decreased weight shift to left Gait velocity: decreased Gait velocity interpretation: Below normal speed for age/gender    Stairs            Wheelchair Mobility    Modified Rankin (Stroke Patients Only)        Balance Overall balance assessment: Needs assistance Sitting-balance support: Feet supported;No upper extremity supported Sitting balance-Leahy Scale: Fair     Standing balance support: During functional activity;No upper extremity supported Standing balance-Leahy Scale: Fair                               Pertinent Vitals/Pain Pain Assessment: Faces Pain Score: 6  Faces Pain Scale: Hurts a little bit Pain Location: L hip; baseline pain in multiple areas of body Pain Descriptors / Indicators: Grimacing Pain Intervention(s): Monitored during session;Repositioned;Patient requesting pain meds-RN notified    Home Living Family/patient expects to be discharged to:: Private residence Living Arrangements: Spouse/significant other Available Help at Discharge: Family;Available PRN/intermittently Type of Home: Mobile home Home Access: Stairs to enter Entrance Stairs-Rails: Can reach both Entrance Stairs-Number of Steps: 4 Home Layout: One level Home Equipment: Crutches Additional Comments: pt states wife will help him; she works 2nd shift.  He will be alone then    Prior Function Level of Independence: Independent with assistive device(s)         Comments: Pt reported that he was ambulating with use of single axillary crutch since December secondary to pain.     Hand Dominance        Extremity/Trunk Assessment   Upper Extremity Assessment: Defer to OT evaluation           Lower Extremity Assessment: LLE deficits/detail   LLE Deficits / Details: pt with decreased strength and ROM limitations secondary to post-op.  Communication   Communication: No difficulties  Cognition Arousal/Alertness: Awake/alert Behavior During Therapy: WFL for tasks assessed/performed Overall Cognitive Status: Within Functional Limits for tasks assessed                      General Comments      Exercises Total Joint Exercises Quad Sets:  AROM;Strengthening;Left;10 reps;Seated Heel Slides: AAROM;Left;5 reps;Seated Marching in Standing: AROM;Strengthening;Both;10 reps;Seated      Assessment/Plan    PT Assessment Patient needs continued PT services  PT Diagnosis Difficulty walking   PT Problem List Decreased strength;Decreased range of motion;Decreased activity tolerance;Decreased balance;Decreased mobility;Decreased coordination;Decreased knowledge of use of DME;Pain  PT Treatment Interventions DME instruction;Gait training;Stair training;Functional mobility training;Therapeutic activities;Therapeutic exercise;Balance training;Neuromuscular re-education;Patient/family education   PT Goals (Current goals can be found in the Care Plan section) Acute Rehab PT Goals Patient Stated Goal: return home PT Goal Formulation: With patient Time For Goal Achievement: 05/18/16 Potential to Achieve Goals: Good    Frequency 7X/week   Barriers to discharge        Co-evaluation               End of Session Equipment Utilized During Treatment: Gait belt Activity Tolerance: Patient limited by pain Patient left: in chair;with call bell/phone within reach Nurse Communication: Mobility status         Time: 1610-9604 PT Time Calculation (min) (ACUTE ONLY): 19 min   Charges:   PT Evaluation $PT Eval Low Complexity: 1 Procedure     PT G CodesAlessandra Bevels  05/11/2016, 11:16 AM Deborah Chalk, PT, DPT [phone]

## 2016-05-11 NOTE — Progress Notes (Signed)
Physical Therapy Treatment Patient Details Name: Peter Thompson MRN: [id number] DOB: 12-17-1965 Today's Date: 05/11/2016    History of Present Illness Pt is a 50 y/o male s/p L THA, anterior approach. PMH including but not limited to HTN.    PT Comments    Pt presented sitting EOB, awake and willing to participate in therapy session. However, pt was limited throughout session secondary to severe pain (8/10). Pt called nurse during session to request pain meds. Pt would continue to benefit from skilled physical therapy services at this time while admitted and after d/c to address his limitations in order to improve his overall safety and independence with functional mobility. PT plan to stair train at next session.   Follow Up Recommendations  Home health PT;Supervision for mobility/OOB     Equipment Recommendations  Rolling walker with 5" wheels;3in1 (PT)    Recommendations for Other Services       Precautions / Restrictions Precautions Precautions: Fall Restrictions Weight Bearing Restrictions: Yes LLE Weight Bearing: Weight bearing as tolerated    Mobility  Bed Mobility               General bed mobility comments: pt sitting EOB when PT entered room  Transfers Overall transfer level: Needs assistance Equipment used: Rolling walker (2 wheeled) Transfers: Sit to/from Stand Sit to Stand: Supervision         General transfer comment: pt required increased time  Ambulation/Gait Ambulation/Gait assistance: Supervision Ambulation Distance (Feet): 150 Feet Assistive device: Rolling walker (2 wheeled) Gait Pattern/deviations: Step-through pattern;Decreased step length - right;Decreased stance time - left;Decreased weight shift to left Gait velocity: decreased Gait velocity interpretation: Below normal speed for age/gender     Stairs            Wheelchair Mobility    Modified Rankin (Stroke Patients Only)       Balance Overall balance assessment:  Needs assistance Sitting-balance support: Feet supported;No upper extremity supported Sitting balance-Leahy Scale: Fair     Standing balance support: During functional activity;No upper extremity supported Standing balance-Leahy Scale: Fair                      Cognition Arousal/Alertness: Awake/alert Behavior During Therapy: WFL for tasks assessed/performed Overall Cognitive Status: Within Functional Limits for tasks assessed                      Exercises      General Comments        Pertinent Vitals/Pain Pain Assessment: 0-10 Pain Score: 8  Pain Location: L hip, neck Pain Descriptors / Indicators: Burning;Grimacing;Guarding Pain Intervention(s): Monitored during session;Limited activity within patient's tolerance;Patient requesting pain meds-RN notified    Home Living                      Prior Function            PT Goals (current goals can now be found in the care plan section) Acute Rehab PT Goals Patient Stated Goal: return home PT Goal Formulation: With patient Time For Goal Achievement: 05/18/16 Potential to Achieve Goals: Good Progress towards PT goals: Progressing toward goals    Frequency  7X/week    PT Plan Current plan remains appropriate    Co-evaluation             End of Session Equipment Utilized During Treatment: Gait belt Activity Tolerance: Patient limited by pain Patient left: in bed;with call bell/phone within reach  Time: [id number] PT Time Calculation (min) (ACUTE ONLY): 16 min  Charges:  $Gait Training: 8-22 mins                    G Codes:      Alessandra BevelsJennifer M  05/11/2016, 4:25 PM Deborah ChalkJennifer , PT, DPT [phone]

## 2016-05-11 NOTE — Anesthesia Postprocedure Evaluation (Signed)
Anesthesia Post Note  Patient: Peter Thompson  Procedure(s) Performed: Procedure(s) (LRB): TOTAL HIP ARTHROPLASTY ANTERIOR APPROACH (Left)  Patient location during evaluation: PACU Anesthesia Type: Spinal Level of consciousness: awake Pain management: pain level controlled Vital Signs Assessment: post-procedure vital signs reviewed and stable Respiratory status: spontaneous breathing Cardiovascular status: stable Postop Assessment: no signs of nausea or vomiting and spinal receding Anesthetic complications: no    Last Vitals:  Vitals:   05/11/16 0930 05/11/16 1108  BP: (!) 90/46 (!) 119/58  Pulse: 65   Resp: 19   Temp: 36.8 C     Last Pain:  Vitals:   05/11/16 1108  TempSrc:   PainSc: 8                   

## 2016-05-11 NOTE — Evaluation (Signed)
Occupational Therapy Evaluation Patient Details Name: Peter Thompson MRN: [id number] DOB: 10-Jan-1966 Today's Date: 05/11/2016    History of Present Illness Pt is a 50 y/o male s/p L THA, anterior approach. PMH including but not limited to HTN.   Clinical Impression   Pt was admitted for the above sx.  He was independent with adls prior to admission. He will benefit from at least one more session of OT prior to d/c home to review AE and tub options.     Follow Up Recommendations  Supervision - Intermittent    Equipment Recommendations  3 in 1 bedside comode    Recommendations for Other Services       Precautions / Restrictions Precautions Precautions: Fall Restrictions Weight Bearing Restrictions: Yes LLE Weight Bearing: Weight bearing as tolerated      Mobility Bed Mobility               General bed mobility comments: pt standing up in front of recliner when PT entered room  Transfers Overall transfer level: Needs assistance Equipment used: Rolling walker (2 wheeled) Transfers: Sit to/from Stand Sit to Stand: Supervision         General transfer comment: pt required increased time    Balance Overall balance assessment: Needs assistance Sitting-balance support: Feet supported;No upper extremity supported Sitting balance-Leahy Scale: Fair     Standing balance support: During functional activity;No upper extremity supported Standing balance-Leahy Scale: Fair                              ADL Overall ADL's : Needs assistance/impaired     Grooming: Oral care;Supervision/safety;Standing   Upper Body Bathing: Set up;Sitting   Lower Body Bathing: Minimal assistance;Sit to/from stand   Upper Body Dressing : Set up;Sitting   Lower Body Dressing: Moderate assistance;Sit to/from stand   Toilet Transfer: Supervision/safety;Ambulation;BSC;RW   Toileting- Clothing Manipulation and Hygiene: Supervision/safety;Sit to/from stand          General ADL Comments: 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  Encouraged him to work within pain tolerance and not to overdo it.  He will be alone part of day. Will introduce AE on next visit.  Pt wanted to remain standing and NT in room with him.  His bathroom is tight.     Vision     Perception     Praxis      Pertinent Vitals/Pain Pain Assessment: Faces Pain Score: 6  Faces Pain Scale: Hurts a little bit Pain Location: L hip; baseline pain in multiple areas of body Pain Descriptors / Indicators: Grimacing Pain Intervention(s): Monitored during session;Repositioned;Patient requesting pain meds-RN notified     Hand Dominance     Extremity/Trunk Assessment Upper Extremity Assessment Upper Extremity Assessment: Defer to OT evaluation          Communication Communication Communication: No difficulties   Cognition Arousal/Alertness: Awake/alert Behavior During Therapy: WFL for tasks assessed/performed Overall Cognitive Status: Within Functional Limits for tasks assessed                     General Comments       Exercises Exercises: Total Joint     Shoulder Instructions      Home Living Family/patient expects to be discharged to:: Private residence Living Arrangements: Alone Available Help at Discharge: Family;Available PRN/intermittently Type of Home: Mobile home Home Access: Stairs to enter Entrance Stairs-Number of Steps: 4 Entrance Stairs-Rails: Can reach  both Home Layout: One level     Bathroom Shower/Tub: Tub/shower unit Shower/tub characteristics: Engineer, building servicesCurtain Bathroom Toilet: Standard     Home Equipment: Crutches   Additional Comments: pt states wife will help him; she works 2nd shift.  He will be alone then      Prior Functioning/Environment Level of Independence: Independent with assistive device(s)        Comments: Pt reported that he was ambulating with use of single axillary crutch since December secondary to pain.     OT Diagnosis: Acute pain   OT Problem List: Decreased strength;Decreased activity tolerance;Decreased knowledge of use of DME or AE;Pain   OT Treatment/Interventions: Self-care/ADL training;DME and/or AE instruction;Patient/family education    OT Goals(Current goals can be found in the care plan section) Acute Rehab OT Goals Patient Stated Goal: return home OT Goal Formulation: With patient Time For Goal Achievement: 05/18/16 Potential to Achieve Goals: Good ADL Goals Pt Will Perform Lower Body Bathing: with supervision;with adaptive equipment;sit to/from stand Pt Will Perform Lower Body Dressing: with supervision;with adaptive equipment;sit to/from stand Additional ADL Goal #1: pt will verbalize tub options (3:1 vs tub bench/seat) vs readiness for stepping inside  OT Frequency: Min 2X/week   Barriers to D/C:            Co-evaluation              End of Session    Activity Tolerance: Patient tolerated treatment well Patient left:  (standing in front of chair with NT in room)   Time: [id number] OT Time Calculation (min): 20 min Charges:  OT General Charges $OT Visit: 1 Procedure OT Evaluation $OT Eval Low Complexity: 1 Procedure G-Codes:    , 05/11/2016, 11:25 AM Marica OtterMaryellen , OTR/L [phone] 05/11/2016

## 2016-05-12 MED ORDER — ASPIRIN 325 MG PO TBEC: 325.0000 mg | DELAYED_RELEASE_TABLET | Freq: Two times a day (BID) | ORAL | 0 refills | Status: DC

## 2016-05-12 MED ORDER — METHOCARBAMOL 500 MG PO TABS: 500.0000 mg | ORAL_TABLET | Freq: Four times a day (QID) | ORAL | 0 refills | Status: DC | PRN

## 2016-05-12 MED ORDER — OXYCODONE HCL 15 MG PO TABS: 15.0000 mg | ORAL_TABLET | ORAL | 0 refills | Status: DC | PRN

## 2016-05-12 NOTE — Discharge Summary (Signed)
Patient ID: Peter Thompson MRN: [id number] DOB/AGE: 50-Apr-1967 50 y.o.  Admit date: 05/10/2016 Discharge date: 05/12/2016  Admission Diagnoses:  Principal Problem:   Primary localized osteoarthritis of left hip Active Problems:   Primary osteoarthritis of left hip   Discharge Diagnoses:  Same  Past Medical History:  Diagnosis Date  . Anxiety   . Arthritis   . Bursitis   . DJD (degenerative joint disease)   . Hypertension   . Insomnia   . Neck fracture (HCC)     Surgeries: Procedure(s): TOTAL HIP ARTHROPLASTY ANTERIOR APPROACH on 05/10/2016   Consultants:   Discharged Condition: Improved  Hospital Course: Peter Thompson is an 50 y.o. male who was admitted 05/10/2016 for operative treatment ofPrimary localized osteoarthritis of left hip. Patient has severe unremitting pain that affects sleep, daily activities, and work/hobbies. After pre-op clearance the patient was taken to the operating room on 05/10/2016 and underwent  Procedure(s): TOTAL HIP ARTHROPLASTY ANTERIOR APPROACH.    Patient was given perioperative antibiotics: Anti-infectives    Start     Dose/Rate Route Frequency Ordered Stop   05/11/16 0400  ceFAZolin (ANCEF) IVPB 2g/100 mL premix     2 g 200 mL/hr over 30 Minutes Intravenous Every 6 hours 05/10/16 2355 05/11/16 0453   05/10/16 1730  ceFAZolin (ANCEF) IVPB 2g/100 mL premix  Status:  Discontinued     2 g 200 mL/hr over 30 Minutes Intravenous Every 6 hours 05/10/16 1709 05/10/16 2355   05/10/16 1100  ceFAZolin (ANCEF) IVPB 2g/100 mL premix     2 g 200 mL/hr over 30 Minutes Intravenous To ShortStay Surgical 05/09/16 1310 05/10/16 1152       Patient was given sequential compression devices, early ambulation, and chemoprophylaxis to prevent DVT.  Patient benefited maximally from hospital stay and there were no complications.    Recent vital signs: Patient Vitals for the past 24 hrs:  BP Temp Temp src Pulse Resp SpO2  05/12/16 1248 (!) 112/56 97.4 F  (36.3 C) Oral 66 16 99 %  05/12/16 0500 138/82 98.3 F (36.8 C) Oral 77 18 98 %  05/11/16 2057 137/72 98.7 F (37.1 C) Oral 76 17 99 %  05/11/16 1613 124/63 - - 76 - 99 %     Recent laboratory studies: No results for input(s): WBC, HGB, HCT, PLT, NA, K, CL, CO2, BUN, CREATININE, GLUCOSE, INR, CALCIUM in the last 72 hours.  Invalid input(s): PT, 2   Discharge Medications:     Medication List    STOP taking these medications   naproxen 500 MG tablet Commonly known as:  NAPROSYN     TAKE these medications   ALPRAZolam 1 MG tablet Commonly known as:  XANAX Take 1 mg by mouth at bedtime.   amLODipine 10 MG tablet Commonly known as:  NORVASC Take 10 mg by mouth daily.   aspirin 325 MG EC tablet Take 1 tablet (325 mg total) by mouth 2 (two) times daily after a meal.   cloNIDine 0.2 MG tablet Commonly known as:  CATAPRES Take 0.2 mg by mouth 3 (three) times daily.   hydrALAZINE 100 MG tablet Commonly known as:  APRESOLINE Take 100 mg by mouth 3 (three) times daily.   labetalol 300 MG tablet Commonly known as:  NORMODYNE Take 150 mg by mouth 2 (two) times daily.   lisinopril-hydrochlorothiazide 20-25 MG tablet Commonly known as:  PRINZIDE,ZESTORETIC Take 1 tablet by mouth daily.   methocarbamol 500 MG tablet Commonly known as:  ROBAXIN Take 1 tablet (500  mg total) by mouth every 6 (six) hours as needed for muscle spasms.   oxyCODONE 15 MG immediate release tablet Commonly known as:  ROXICODONE Take 1 tablet (15 mg total) by mouth every 4 (four) hours as needed for pain. What changed:  when to take this   potassium chloride 10 MEQ tablet Commonly known as:  K-DUR Take 10 mEq by mouth daily.       Diagnostic Studies: Dg Chest 2 View  Result Date: 04/29/2016 CLINICAL DATA:  Left hip replacement. EXAM: CHEST  2 VIEW COMPARISON:  No recent prior . FINDINGS: Mediastinum hilar structures normal. Cardiomegaly with mild pulmonary venous congestion. Basal  pleural-parenchymal thickening noted most likely secondary to scarring. No pleural effusion or pneumothorax. Diffuse osteopenia degenerative change. Surgical wiring noted over the cervical spine. IMPRESSION: Cardiomegaly with mild pulmonary venous congestion. Basilar pleural-parenchymal thickening noted most likely secondary to scarring. Electronically Signed   By: Maisie Fus  Register   On: 04/29/2016 13:51   Dg C-arm 61-120 Min  Result Date: 05/10/2016 CLINICAL DATA:  LEFT anterior total hip arthroplasty EXAM: DG C-ARM 61-120 MIN; OPERATIVE LEFT HIP WITH PELVIS COMPARISON:  None FLUOROSCOPY TIME:  0 minutes 46 seconds Images obtained: 2 FINDINGS: LEFT hip prosthesis identified. No fracture or dislocation. Osseous mineralization grossly normal. IMPRESSION: LEFT hip prosthesis without acute complication. Electronically Signed   By: Ulyses Southward M.D.   On: 05/10/2016 15:27   Dg Hip Operative Unilat With Pelvis Left  Result Date: 05/10/2016 CLINICAL DATA:  LEFT anterior total hip arthroplasty EXAM: DG C-ARM 61-120 MIN; OPERATIVE LEFT HIP WITH PELVIS COMPARISON:  None FLUOROSCOPY TIME:  0 minutes 46 seconds Images obtained: 2 FINDINGS: LEFT hip prosthesis identified. No fracture or dislocation. Osseous mineralization grossly normal. IMPRESSION: LEFT hip prosthesis without acute complication. Electronically Signed   By: Ulyses Southward M.D.   On: 05/10/2016 15:27    Disposition: Final discharge disposition not confirmed  Discharge Instructions    Call MD / Call 911    Complete by:  As directed   If you experience chest pain or shortness of breath, CALL 911 and be transported to the hospital emergency room.  If you develope a fever above 101 F, pus (white drainage) or increased drainage or redness at the wound, or calf pain, call your surgeon's office.   Constipation Prevention    Complete by:  As directed   Drink plenty of fluids.  Prune juice may be helpful.  You may use a stool softener, such as Colace (over  the counter) 100 mg twice a day.  Use MiraLax (over the counter) for constipation as needed.   Diet - low sodium heart healthy    Complete by:  As directed   Discharge instructions    Complete by:  As directed   INSTRUCTIONS AFTER JOINT REPLACEMENT   Remove items at home which could result in a fall. This includes throw rugs or furniture in walking pathways ICE to the affected joint every three hours while awake for 30 minutes at a time, for at least the first 3-5 days, and then as needed for pain and swelling.  Continue to use ice for pain and swelling. You may notice swelling that will progress down to the foot and ankle.  This is normal after surgery.  Elevate your leg when you are not up walking on it.   Continue to use the breathing machine you got in the hospital (incentive spirometer) which will help keep your temperature down.  It is common  for your temperature to cycle up and down following surgery, especially at night when you are not up moving around and exerting yourself.  The breathing machine keeps your lungs expanded and your temperature down.   DIET:  As you were doing prior to hospitalization, we recommend a well-balanced diet.  DRESSING / WOUND CARE / SHOWERING  You may shower 3 days after surgery, but keep the wounds dry during showering.  You may use an occlusive plastic wrap (Press'n Seal for example), NO SOAKING/SUBMERGING IN THE BATHTUB.  If the bandage gets wet, change with a clean dry gauze.  If the incision gets wet, pat the wound dry with a clean towel.  ACTIVITY  Increase activity slowly as tolerated, but follow the weight bearing instructions below.   No driving for 6 weeks or until further direction given by your physician.  You cannot drive while taking narcotics.  No lifting or carrying greater than 10 lbs. until further directed by your surgeon. Avoid periods of inactivity such as sitting longer than an hour when not asleep. This helps prevent blood clots.  You  may return to work once you are authorized by your doctor.     WEIGHT BEARING   Weight bearing as tolerated with assist device (walker, cane, etc) as directed, use it as long as suggested by your surgeon or therapist, typically at least 4-6 weeks.   EXERCISES  Results after joint replacement surgery are often greatly improved when you follow the exercise, range of motion and muscle strengthening exercises prescribed by your doctor. Safety measures are also important to protect the joint from further injury. Any time any of these exercises cause you to have increased pain or swelling, decrease what you are doing until you are comfortable again and then slowly increase them. If you have problems or questions, call your caregiver or physical therapist for advice.   Rehabilitation is important following a joint replacement. After just a few days of immobilization, the muscles of the leg can become weakened and shrink (atrophy).  These exercises are designed to build up the tone and strength of the thigh and leg muscles and to improve motion. Often times heat used for twenty to thirty minutes before working out will loosen up your tissues and help with improving the range of motion but do not use heat for the first two weeks following surgery (sometimes heat can increase post-operative swelling).   These exercises can be done on a training (exercise) mat, on the floor, on a table or on a bed. Use whatever works the best and is most comfortable for you.    Use music or television while you are exercising so that the exercises are a pleasant break in your day. This will make your life better with the exercises acting as a break in your routine that you can look forward to.   Perform all exercises about fifteen times, three times per day or as directed.  You should exercise both the operative leg and the other leg as well.   Exercises include:   Quad Sets - Tighten up the muscle on the front of the thigh  (Quad) and hold for 5-10 seconds.   Straight Leg Raises - With your knee straight (if you were given a brace, keep it on), lift the leg to 60 degrees, hold for 3 seconds, and slowly lower the leg.  Perform this exercise against resistance later as your leg gets stronger.  Leg Slides: Lying on your back, slowly slide  your foot toward your buttocks, bending your knee up off the floor (only go as far as is comfortable). Then slowly slide your foot back down until your leg is flat on the floor again.  Angel Wings: Lying on your back spread your legs to the side as far apart as you can without causing discomfort.  Hamstring Strength:  Lying on your back, push your heel against the floor with your leg straight by tightening up the muscles of your buttocks.  Repeat, but this time bend your knee to a comfortable angle, and push your heel against the floor.  You may put a pillow under the heel to make it more comfortable if necessary.   A rehabilitation program following joint replacement surgery can speed recovery and prevent re-injury in the future due to weakened muscles. Contact your doctor or a physical therapist for more information on knee rehabilitation.    CONSTIPATION  Constipation is defined medically as fewer than three stools per week and severe constipation as less than one stool per week.  Even if you have a regular bowel pattern at home, your normal regimen is likely to be disrupted due to multiple reasons following surgery.  Combination of anesthesia, postoperative narcotics, change in appetite and fluid intake all can affect your bowels.   YOU MUST use at least one of the following options; they are listed in order of increasing strength to get the job done.  They are all available over the counter, and you may need to use some, POSSIBLY even all of these options:    Drink plenty of fluids (prune juice may be helpful) and high fiber foods Colace 100 mg by mouth twice a day  Senokot for  constipation as directed and as needed Dulcolax (bisacodyl), take with full glass of water  Miralax (polyethylene glycol) once or twice a day as needed.  If you have tried all these things and are unable to have a bowel movement in the first 3-4 days after surgery call either your surgeon or your primary doctor.    If you experience loose stools or diarrhea, hold the medications until you stool forms back up.  If your symptoms do not get better within 1 week or if they get worse, check with your doctor.  If you experience "the worst abdominal pain ever" or develop nausea or vomiting, please contact the office immediately for further recommendations for treatment.   ITCHING:  If you experience itching with your medications, try taking only a single pain pill, or even half a pain pill at a time.  You can also use Benadryl over the counter for itching or also to help with sleep.   TED HOSE STOCKINGS:  Use stockings on both legs until for at least 2 weeks or as directed by physician office. They may be removed at night for sleeping.  MEDICATIONS:  See your medication summary on the "After Visit Summary" that nursing will review with you.  You may have some home medications which will be placed on hold until you complete the course of blood thinner medication.  It is important for you to complete the blood thinner medication as prescribed.  PRECAUTIONS:  If you experience chest pain or shortness of breath - call 911 immediately for transfer to the hospital emergency department.   If you develop a fever greater that 101 F, purulent drainage from wound, increased redness or drainage from wound, foul odor from the wound/dressing, or calf pain - CONTACT YOUR SURGEON.  FOLLOW-UP APPOINTMENTS:  If you do not already have a post-op appointment, please call the office for an appointment to be seen by your surgeon.  Guidelines for how soon to be seen are listed in  your "After Visit Summary", but are typically between 1-4 weeks after surgery.  OTHER INSTRUCTIONS:   Knee Replacement:  Do not place pillow under knee, focus on keeping the knee straight while resting. CPM instructions: 0-90 degrees, 2 hours in the morning, 2 hours in the afternoon, and 2 hours in the evening. Place foam block, curve side up under heel at all times except when in CPM or when walking.  DO NOT modify, tear, cut, or change the foam block in any way.  MAKE SURE YOU:  Understand these instructions.  Get help right away if you are not doing well or get worse.    Thank you for letting us be a part of your medical care team.  It is a privilege we respect greatly.  We hope these instructions will help you stay on track for a fast and full recovery!   Increase activity slowly as tolerated    Complete by:  As directed      Follow-up Information    DALLDORF,PETER G, MD. Schedule an appointment as soon as possible for a visit in 2 weeks.   Specialty:  Orthopedic Surgery Contact information: [address] [place] [address] [phone]            Signed: Drema Halon 05/12/2016, 2:01 PM

## 2016-05-12 NOTE — Progress Notes (Signed)
Pt discharged to home via wheelchair per MD order without incident. All d/c teachings done both written and verbal. All questions answered. Pt verb understanding and agrees to comply. Pt walking up and down the hall but states pain is 8 on scale of 1-10. No change from AM assessment.

## 2016-05-12 NOTE — Care Management Note (Signed)
Case Management Note  Patient Details  Name: Peter Thompson MRN: [id number] Date of Birth: Feb 25, 1966  Subjective/Objective:  50 yr old male s/p left anterior hip arthroplasty.                Action/Plan: Case manager spoke with patient concerning Home Health and DME needs. Referral was called to [facility], Patient lives in a zip code that they are not able to service. CM contacted Katharina Caperrew Wil[facility] DME has been delivered to patient's room, he states he will have family support at discharge.  Patient is covered by medicaid, [facility]  Expected Discharge Date:   05/12/16               Expected Discharge Plan:  Home w Home Health Services  In-House Referral:     Discharge planning Services  CM Consult  Post Acute Care Choice:  Home Health, Durable Medical Equipment Choice offered to:  Patient  DME Arranged:  3-N-1, Walker rolling DME Agency:  [facility]  HH Arranged:  PT/RN [facility]  Status of Service:     If discussed at Long Length of Stay Meetings, dates discussed:    Additional Comments:  Durenda GuthrieBrady,  Naomi, RN 05/12/2016, 2:37 PM

## 2016-05-12 NOTE — Progress Notes (Signed)
Physical Therapy Treatment Patient Details Name: Peter Thompson MRN: [id number] DOB: [birth year] Today's Date: 05/12/2016    History of Present Illness Pt is a 50 y/o male s/p L THA, anterior approach. PMH including but not limited to HTN.    PT Comments    Pt presented standing up with RW in bathroom when PT entered room. Pt moving well with supervision and use of RW and continues to make excellent progress towards achieving his functional goals. Pt would continue to benefit from skilled physical therapy services at this time while admitted and after d/c to address his limitations in order to improve his overall safety and independence with functional mobility.   Follow Up Recommendations  Home health PT;Supervision for mobility/OOB     Equipment Recommendations  Rolling walker with 5" wheels;3in1 (PT)    Recommendations for Other Services       Precautions / Restrictions Precautions Precautions: Fall Restrictions Weight Bearing Restrictions: Yes LLE Weight Bearing: Weight bearing as tolerated    Mobility  Bed Mobility               General bed mobility comments: pt standing in bathroom with RW when PT entered room  Transfers Overall transfer level: Modified independent Equipment used: Rolling walker (2 wheeled)                Ambulation/Gait Ambulation/Gait assistance: Supervision Ambulation Distance (Feet): 500 Feet Assistive device: Rolling walker (2 wheeled) Gait Pattern/deviations: Step-through pattern;Trunk flexed Gait velocity: decreased Gait velocity interpretation: Below normal speed for age/gender     Stairs            Wheelchair Mobility    Modified Rankin (Stroke Patients Only)       Balance Overall balance assessment: Needs assistance Sitting-balance support: Feet supported;No upper extremity supported Sitting balance-Leahy Scale: Good     Standing balance support: During functional activity;No upper extremity  supported Standing balance-Leahy Scale: Fair                      Cognition Arousal/Alertness: Awake/alert Behavior During Therapy: WFL for tasks assessed/performed Overall Cognitive Status: Within Functional Limits for tasks assessed                      Exercises      General Comments        Pertinent Vitals/Pain Pain Assessment: Faces Faces Pain Scale: No hurt Pain Intervention(s): Monitored during session    Home Living                      Prior Function            PT Goals (current goals can now be found in the care plan section) Acute Rehab PT Goals Patient Stated Goal: return home PT Goal Formulation: With patient Time For Goal Achievement: 05/18/16 Potential to Achieve Goals: Good Progress towards PT goals: Progressing toward goals    Frequency  7X/week    PT Plan Current plan remains appropriate    Co-evaluation             End of Session Equipment Utilized During Treatment: Gait belt Activity Tolerance: Patient tolerated treatment well Patient left: with call bell/phone within reach;Other (comment) (pt standing at bedside ready to d/c home)     Time: 7829-5621 PT Time Calculation (min) (ACUTE ONLY): 35 min  Charges:  $Gait Training: 23-37 mins  G CodesAlessandra Bevels:       M  05/12/2016, 4:52 PM Deborah Chalk , PT, DPT [phone]

## 2016-05-12 NOTE — Progress Notes (Signed)
Physical Therapy Treatment Patient Details Name: Peter PieriniScott Thompson MRN: [id number] DOB: June 20, 1966 Today's Date: 05/12/2016    History of Present Illness Pt is a 50 y/o male s/p L THA, anterior approach. PMH including but not limited to HTN.    PT Comments    Pt presented standing in front of his recliner in his room when PT entered. Pt making excellent progress towards achieving his goals and successfully completed stair training during this session. Pt would continue to benefit from skilled physical therapy services at this time while admitted and after d/c to address his limitations in order to improve his overall safety and independence with functional mobility.   Follow Up Recommendations  Home health PT;Supervision for mobility/OOB     Equipment Recommendations  Rolling walker with 5" wheels;3in1 (PT)    Recommendations for Other Services       Precautions / Restrictions Precautions Precautions: Fall Restrictions Weight Bearing Restrictions: Yes LLE Weight Bearing: Weight bearing as tolerated    Mobility  Bed Mobility               General bed mobility comments: pt standing up in front of his recliner when PT entered room  Transfers Overall transfer level: Needs assistance Equipment used: Rolling walker (2 wheeled) Transfers: Sit to/from Stand Sit to Stand: Supervision         General transfer comment: pt required increased time  Ambulation/Gait Ambulation/Gait assistance: Supervision Ambulation Distance (Feet): 300 Feet Assistive device: Rolling walker (2 wheeled) Gait Pattern/deviations: Step-through pattern     General Gait Details: pt using a more normal gait pattern during this session and with increased speed   Stairs Stairs: Yes Stairs assistance: Min guard Stair Management: One rail Left;Step to pattern Number of Stairs: 4 General stair comments: pt ascended with R LE leading and descended with L LE leading  Wheelchair Mobility     Modified Rankin (Stroke Patients Only)       Balance Overall balance assessment: Needs assistance Sitting-balance support: Feet supported;No upper extremity supported Sitting balance-Leahy Scale: Good     Standing balance support: During functional activity;No upper extremity supported Standing balance-Leahy Scale: Fair                      Cognition Arousal/Alertness: Awake/alert Behavior During Therapy: WFL for tasks assessed/performed Overall Cognitive Status: Within Functional Limits for tasks assessed                      Exercises Total Joint Exercises Hip ABduction/ADduction: AROM;Strengthening;Left;20 reps;Standing Marching in Standing: AROM;Strengthening;Both;20 reps;Standing Standing Hip Extension: AROM;Strengthening;Left;15 reps;Standing    General Comments        Pertinent Vitals/Pain Pain Assessment: 0-10 Pain Score: 8  Pain Location: L hip Pain Descriptors / Indicators: Burning Pain Intervention(s): Monitored during session;Repositioned    Home Living                      Prior Function            PT Goals (current goals can now be found in the care plan section) Acute Rehab PT Goals Patient Stated Goal: return home PT Goal Formulation: With patient Time For Goal Achievement: 05/18/16 Potential to Achieve Goals: Good Progress towards PT goals: Progressing toward goals    Frequency  7X/week    PT Plan Current plan remains appropriate    Co-evaluation             End of Session Equipment  Utilized During Treatment: Gait belt Activity Tolerance: Patient tolerated treatment well Patient left: in chair;with call bell/phone within reach     Time: 0855-0916 PT Time Calculation (min) (ACUTE ONLY): 21 min  Charges:  $Gait Training: 8-22 mins                    G CodesAlessandra Bevels  01-Jun-2016, 9:51 AM Deborah Chalk, PT, DPT [phone]

## 2016-12-05 DIAGNOSIS — Z01818 Encounter for other preprocedural examination: Secondary | ICD-10-CM

## 2016-12-05 DIAGNOSIS — F17200 Nicotine dependence, unspecified, uncomplicated: Secondary | ICD-10-CM

## 2016-12-05 DIAGNOSIS — IMO0001 Reserved for inherently not codable concepts without codable children: Secondary | ICD-10-CM

## 2016-12-05 HISTORY — DX: Nicotine dependence, unspecified, uncomplicated: F17.200

## 2016-12-05 HISTORY — DX: Encounter for other preprocedural examination: Z01.818

## 2016-12-05 HISTORY — DX: Reserved for inherently not codable concepts without codable children: IMO0001

## 2016-12-14 ENCOUNTER — Other Ambulatory Visit: Payer: Self-pay | Admitting: Orthopaedic Surgery

## 2016-12-21 NOTE — Pre-Procedure Instructions (Signed)
    Peter Thompson  12/21/2016      [facility] - [place], [place] - [address] [place] [address] Phone: [phone] Fax: [phone]    Your procedure is scheduled on : Tuesday, April 17th.   Report to [facility] Admitting at 8:10 am.             (posted surgery time 10:10 am - 12:22 pm)   Call this number if you have problems the morning of surgery:  [phone], for all other questions, please call [phone] from 8-4:30 pm Mon - Fri.   Remember:  Do not eat food or drink liquids after midnight Monday.   Take these medicines the morning of surgery with A SIP OF WATER :Norvasc, Clonidine, Labetalol, Oxcycodone.               4-5 days prior to surgery, STOP taking any Vitamins, Herbal Supplements, Anti-inflammatories.   Do not wear jewelry - no rings or watches.  Do not wear lotions, colognes or deoderant.             Men may shave face and neck.   Do not bring valuables to the hospital.  [facility] is not responsible for any belongings or valuables.  Contacts, dentures or bridgework may not be worn into surgery.  Leave your suitcase in the car.  After surgery it may be brought to your room.  Patients discharged the day of surgery will not be allowed to drive home, plus you will need someone to stay with you for the first 24 hrs.  Please read over the following fact sheets that you were given, Pain Management, Surgical Site infections, Preparing for Surgery.

## 2016-12-21 NOTE — H&P (Signed)
Peter Thompson is an 51 y.o. male.   Chief Complaint: Left shoulder pain HPI: Peter Thompson continues with some terrible left shoulder pain.  He's been through an MRI scan since the last time he was here.  We injected his shoulder late last year.  He has trouble resting at night and trouble using his arm out to the side.   MRI:  I reviewed an MRI scan films and report of a study done at [facility] on 10/20/16.  This shows some significant cuff tendinopathy but no full-thickness tear.  He also has some significant AC arthritis.  Past Medical History:  Diagnosis Date  . Anxiety   . Arthritis   . Bursitis   . DJD (degenerative joint disease)   . Hypertension   . Insomnia   . Neck fracture [facility])     Past Surgical History:  Procedure Laterality Date  . Arm surgery Right    fracture repair  . CERVICAL SPINE SURGERY     bone graft from left hip  . HAND SURGERY Left    drains for Infection  . HIP SURGERY Left    "scrapped"  . KNEE ARTHROSCOPY Right   . LEG SURGERY Left    "1 inch took out"  . TOTAL HIP ARTHROPLASTY Left 05/10/2016  . TOTAL HIP ARTHROPLASTY Left 05/10/2016   Procedure: TOTAL HIP ARTHROPLASTY ANTERIOR APPROACH;  Surgeon: Marcene Corning, MD;  Location: [facility] OR;  Service: Orthopedics;  Laterality: Left;  . WRIST SURGERY Right    2 Pinns    No family history on file. Social History:  reports that he has been smoking Cigarettes.  He has a 28.00 pack-year smoking history. His smokeless tobacco use includes Chew. He reports that he does not use drugs. His alcohol history is not on file.  Allergies: No Known Allergies  No prescriptions prior to admission.    No results found for this or any previous visit (from the past 48 hour(s)). No results found.  Review of Systems  Musculoskeletal: Positive for joint pain.       Left shouler  All other systems reviewed and are negative.   There were no vitals taken for this visit. Physical Exam   Assessment/Plan  Assessment:  Left shoulder impingement with MRI 2018 injected 08/01/16  Plan: Peter Thompson says his greatest pain is about the left shoulder.  I think we can help with an arthroscopy.  I reviewed risk of anesthesia and infection related to an outpatient procedure.  Our plan will be to perform an acromioplasty and a formal AC resection.  , Ginger Organ, PA-C 12/21/2016, 1:38 PM

## 2016-12-22 ENCOUNTER — Encounter (HOSPITAL_COMMUNITY): Payer: Self-pay

## 2016-12-22 ENCOUNTER — Encounter (HOSPITAL_COMMUNITY)
Admission: RE | Admit: 2016-12-22 | Discharge: 2016-12-22 | Disposition: A | Discharge status: Home / Self Care | Payer: Medicaid Other | Source: Ambulatory Visit | Attending: Orthopaedic Surgery | Admitting: Orthopaedic Surgery

## 2016-12-22 DIAGNOSIS — Z01812 Encounter for preprocedural laboratory examination: Secondary | ICD-10-CM | POA: Insufficient documentation

## 2016-12-22 DIAGNOSIS — M25512 Pain in left shoulder: Secondary | ICD-10-CM | POA: Insufficient documentation

## 2016-12-22 DIAGNOSIS — F17210 Nicotine dependence, cigarettes, uncomplicated: Secondary | ICD-10-CM | POA: Insufficient documentation

## 2016-12-22 DIAGNOSIS — I10 Essential (primary) hypertension: Secondary | ICD-10-CM | POA: Insufficient documentation

## 2016-12-22 HISTORY — DX: Tinnitus, bilateral: H93.13

## 2016-12-22 LAB — COMPREHENSIVE METABOLIC PANEL
ALBUMIN: 3.9 g/dL (ref 3.5–5.0)
ALK PHOS: 74 U/L (ref 38–126)
ALT: 25 U/L (ref 17–63)
ANION GAP: 9 (ref 5–15)
AST: 29 U/L (ref 15–41)
BILIRUBIN TOTAL: 0.5 mg/dL (ref 0.3–1.2)
BUN: 6 mg/dL (ref 6–20)
CALCIUM: 9.1 mg/dL (ref 8.9–10.3)
CO2: 26 mmol/L (ref 22–32)
Chloride: 99 mmol/L — ABNORMAL LOW (ref 101–111)
Creatinine, Ser: 0.8 mg/dL (ref 0.61–1.24)
GFR calc Af Amer: >60 mL/min (ref >60)
GFR calc non Af Amer: >60 mL/min (ref >60)
GLUCOSE: 127 mg/dL — AB (ref 65–99)
Potassium: 3.6 mmol/L (ref 3.5–5.1)
Sodium: 134 mmol/L — ABNORMAL LOW (ref 135–145)
TOTAL PROTEIN: 6.8 g/dL (ref 6.5–8.1)

## 2016-12-22 LAB — CBC
HEMATOCRIT: 37.9 % — AB (ref 39.0–52.0)
HEMOGLOBIN: 12.9 g/dL — AB (ref 13.0–17.0)
MCH: 30.1 pg (ref 26.0–34.0)
MCHC: 34 g/dL (ref 30.0–36.0)
MCV: 88.6 fL (ref 78.0–100.0)
Platelets: 207 10*3/uL (ref 150–400)
RBC: 4.28 MIL/uL (ref 4.22–5.81)
RDW: 13.2 % (ref 11.5–15.5)
WBC: 6.8 10*3/uL (ref 4.0–10.5)

## 2016-12-22 NOTE — Progress Notes (Signed)
PCP is Dr. Quitman Livings in [place].  LOV 12/2016 Cardio is Dr. Gypsy Balsam [facility]) [phone]  Pt states he went and had cardiac work up for this coming surgery.  I have faxed a request to office for copies of LOV, and results from recent heart tests. Echo 12/2016 Stress  12/06/2016  Patient denies any heart issues.   Does say that he 'woke up' during one of his surgeries, and that he is a hard IV stick. I re-iterated several times that if patient goes home, he will need to have someone stay with him for the first 24 hrs.

## 2016-12-22 NOTE — Progress Notes (Signed)
   12/22/16 1134  OBSTRUCTIVE SLEEP APNEA  Have you ever been diagnosed with sleep apnea through a sleep study? No (was suppose to go for testing 5-6 yrs ago...never went)  Do you snore loudly (loud enough to be heard through closed doors)?  1  Do you often feel tired, fatigued, or sleepy during the daytime (such as falling asleep during driving or talking to someone)? 1  Has anyone observed you stop breathing during your sleep? 1  Do you have, or are you being treated for high blood pressure? 1  BMI more than 35 kg/m2? 1  Age > 50 (1-yes) 1  Neck circumference greater than:Male 16 inches or larger, Male 17inches or larger? 1  Male Gender (Yes=1) 1  Obstructive Sleep Apnea Score 8  Score 5 or greater  Results sent to PCP

## 2016-12-23 NOTE — Progress Notes (Signed)
Anesthesia Chart Review:  Pt is a 51 year old male scheduled for L shoulder arthroscopy on 12/27/2016 with Marcene Corning, MD  - PCP is Quitman Livings, MD - Saw Gypsy Balsam, MD with cardiology 12/05/16 for pre-op eval (note in care everywhere).  Stress test and echo ordered, results below.   PMH includes:  HTN, neck fracture. Current smoker. BMI 37. S/p  R THA 05/10/16.   Medications include: Amlodipine, ASA 325 mg, clonidine, hydralazine, labetalol, lisinopril-HCTZ, potassium.  BP (!) 124/56   Pulse 69   Temp 36.9 C   Resp 20   Ht  (1.778 m)   Wt 257 lb (116.6 kg)   SpO2 98%   BMI 36.88 kg/m   Preoperative labs reviewed.    CXR 04/29/16: Cardiomegaly with mild pulmonary venous congestion. Basilar pleural-parenchymal thickening noted most likely secondary to scarring.  EKG 04/29/16: Sinus bradycardia (58 bpm)  Echo 11/29/16 [facility] cardiology [place]): 1. LV size is normal. Borderline concentric LVH. Overall LV systolic function normal. EF 60-65%. 2. RV moderately enlarged. 3. LA mildly dilated. 4. Mild tricuspid regurgitation.  Nuclear stress test 12/06/16 [facility] cardiology [place]): Ischemia seen on the scan. Normal gated images. Normal EF 60%.  If no changes, I anticipate pt can proceed with surgery as scheduled.   Rica Mast, FNP-BC [facility] Stay [facility]/Anesthesiology Phone: [phone] 12/23/2016 1:30 PM

## 2016-12-27 ENCOUNTER — Ambulatory Visit (HOSPITAL_COMMUNITY): Payer: Medicaid Other | Admitting: Vascular Surgery

## 2016-12-27 ENCOUNTER — Encounter (HOSPITAL_COMMUNITY)
Admission: RE | Disposition: A | Discharge status: Home / Self Care | Payer: Self-pay | Source: Ambulatory Visit | Attending: Orthopaedic Surgery

## 2016-12-27 ENCOUNTER — Ambulatory Visit (HOSPITAL_COMMUNITY): Payer: Medicaid Other | Admitting: Certified Registered Nurse Anesthetist

## 2016-12-27 ENCOUNTER — Ambulatory Visit (HOSPITAL_COMMUNITY)
Admission: RE | Admit: 2016-12-27 | Discharge: 2016-12-27 | Disposition: A | Discharge status: Home / Self Care | Payer: Medicaid Other | Source: Ambulatory Visit | Attending: Orthopaedic Surgery | Admitting: Orthopaedic Surgery

## 2016-12-27 ENCOUNTER — Encounter (HOSPITAL_COMMUNITY): Payer: Self-pay | Admitting: Urology

## 2016-12-27 DIAGNOSIS — Z96642 Presence of left artificial hip joint: Secondary | ICD-10-CM | POA: Diagnosis not present

## 2016-12-27 DIAGNOSIS — G4730 Sleep apnea, unspecified: Secondary | ICD-10-CM | POA: Diagnosis not present

## 2016-12-27 DIAGNOSIS — M75112 Incomplete rotator cuff tear or rupture of left shoulder, not specified as traumatic: Secondary | ICD-10-CM | POA: Diagnosis not present

## 2016-12-27 DIAGNOSIS — I10 Essential (primary) hypertension: Secondary | ICD-10-CM | POA: Diagnosis not present

## 2016-12-27 DIAGNOSIS — F17220 Nicotine dependence, chewing tobacco, uncomplicated: Secondary | ICD-10-CM | POA: Diagnosis not present

## 2016-12-27 DIAGNOSIS — Z79899 Other long term (current) drug therapy: Secondary | ICD-10-CM | POA: Diagnosis not present

## 2016-12-27 DIAGNOSIS — M19012 Primary osteoarthritis, left shoulder: Secondary | ICD-10-CM | POA: Diagnosis not present

## 2016-12-27 DIAGNOSIS — Z7982 Long term (current) use of aspirin: Secondary | ICD-10-CM | POA: Diagnosis not present

## 2016-12-27 DIAGNOSIS — M7542 Impingement syndrome of left shoulder: Secondary | ICD-10-CM | POA: Diagnosis not present

## 2016-12-27 DIAGNOSIS — F17210 Nicotine dependence, cigarettes, uncomplicated: Secondary | ICD-10-CM | POA: Diagnosis not present

## 2016-12-27 DIAGNOSIS — F419 Anxiety disorder, unspecified: Secondary | ICD-10-CM | POA: Diagnosis not present

## 2016-12-27 HISTORY — PX: SHOULDER ARTHROSCOPY: SHX128

## 2016-12-27 SURGERY — ARTHROSCOPY, SHOULDER
Anesthesia: General | Site: Shoulder | Laterality: Left

## 2016-12-27 MED ORDER — EPHEDRINE SULFATE 50 MG/ML IJ SOLN
INTRAMUSCULAR | Status: DC | PRN
  Administered 2016-12-27: 5 mg via INTRAVENOUS
  Administered 2016-12-27: 10 mg via INTRAVENOUS
  Administered 2016-12-27: 5 mg via INTRAVENOUS

## 2016-12-27 MED ORDER — MIDAZOLAM HCL 2 MG/2ML IJ SOLN
INTRAMUSCULAR | Status: AC
  Administered 2016-12-27: 2 mg via INTRAVENOUS
  Filled 2016-12-27: qty 2

## 2016-12-27 MED ORDER — MEPERIDINE HCL 25 MG/ML IJ SOLN: 6.2500 mg | INTRAMUSCULAR | Status: DC | PRN

## 2016-12-27 MED ORDER — MIDAZOLAM HCL 2 MG/2ML IJ SOLN
INTRAMUSCULAR | Status: AC
  Filled 2016-12-27: qty 2

## 2016-12-27 MED ORDER — LIDOCAINE 2% (20 MG/ML) 5 ML SYRINGE
INTRAMUSCULAR | Status: DC | PRN
  Administered 2016-12-27: 100 mg via INTRAVENOUS

## 2016-12-27 MED ORDER — EPHEDRINE SULFATE-NACL 50-0.9 MG/10ML-% IV SOSY
PREFILLED_SYRINGE | INTRAVENOUS | Status: DC | PRN
  Administered 2016-12-27: 5 mg via INTRAVENOUS
  Administered 2016-12-27: 10 mg via INTRAVENOUS

## 2016-12-27 MED ORDER — SUGAMMADEX SODIUM 200 MG/2ML IV SOLN
INTRAVENOUS | Status: DC | PRN
  Administered 2016-12-27: 200 mg via INTRAVENOUS

## 2016-12-27 MED ORDER — PHENYLEPHRINE 40 MCG/ML (10ML) SYRINGE FOR IV PUSH (FOR BLOOD PRESSURE SUPPORT)
PREFILLED_SYRINGE | INTRAVENOUS | Status: DC | PRN
  Administered 2016-12-27: 80 ug via INTRAVENOUS

## 2016-12-27 MED ORDER — EPINEPHRINE PF 1 MG/ML IJ SOLN
INTRAMUSCULAR | Status: DC | PRN
  Administered 2016-12-27 (×2): 1 mg

## 2016-12-27 MED ORDER — PROPOFOL 10 MG/ML IV BOLUS
INTRAVENOUS | Status: AC
  Filled 2016-12-27: qty 20

## 2016-12-27 MED ORDER — ROCURONIUM BROMIDE 10 MG/ML (PF) SYRINGE
PREFILLED_SYRINGE | INTRAVENOUS | Status: DC | PRN
  Administered 2016-12-27: 50 mg via INTRAVENOUS

## 2016-12-27 MED ORDER — FENTANYL CITRATE (PF) 250 MCG/5ML IJ SOLN
INTRAMUSCULAR | Status: AC
  Filled 2016-12-27: qty 5

## 2016-12-27 MED ORDER — FENTANYL CITRATE (PF) 100 MCG/2ML IJ SOLN
INTRAMUSCULAR | Status: AC
  Administered 2016-12-27: 100 ug via INTRAVENOUS
  Filled 2016-12-27: qty 2

## 2016-12-27 MED ORDER — MIDAZOLAM HCL 2 MG/2ML IJ SOLN
2.0000 mg | Freq: Once | INTRAMUSCULAR | Status: AC
  Administered 2016-12-27: 2 mg via INTRAVENOUS

## 2016-12-27 MED ORDER — METOCLOPRAMIDE HCL 5 MG/ML IJ SOLN: 10.0000 mg | Freq: Once | INTRAMUSCULAR | Status: DC | PRN

## 2016-12-27 MED ORDER — OXYCODONE HCL 10 MG PO TABS: 10.0000 mg | ORAL_TABLET | ORAL | 0 refills | Status: DC | PRN

## 2016-12-27 MED ORDER — LACTATED RINGERS IV SOLN: INTRAVENOUS | Status: DC

## 2016-12-27 MED ORDER — FENTANYL CITRATE (PF) 100 MCG/2ML IJ SOLN: 25.0000 ug | INTRAMUSCULAR | Status: DC | PRN

## 2016-12-27 MED ORDER — ONDANSETRON HCL 4 MG/2ML IJ SOLN
INTRAMUSCULAR | Status: DC | PRN
  Administered 2016-12-27: 4 mg via INTRAVENOUS

## 2016-12-27 MED ORDER — OXYCODONE HCL 5 MG PO TABS
10.0000 mg | ORAL_TABLET | Freq: Once | ORAL | Status: AC
  Administered 2016-12-27: 20 mg via ORAL

## 2016-12-27 MED ORDER — EPINEPHRINE PF 1 MG/ML IJ SOLN
INTRAMUSCULAR | Status: AC
  Filled 2016-12-27: qty 2

## 2016-12-27 MED ORDER — PHENYLEPHRINE HCL 10 MG/ML IJ SOLN
INTRAVENOUS | Status: DC | PRN
  Administered 2016-12-27: 30 ug/min via INTRAVENOUS

## 2016-12-27 MED ORDER — GLYCOPYRROLATE 0.2 MG/ML IJ SOLN
INTRAMUSCULAR | Status: DC | PRN
  Administered 2016-12-27 (×3): .1 mg via INTRAVENOUS

## 2016-12-27 MED ORDER — FENTANYL CITRATE (PF) 100 MCG/2ML IJ SOLN
INTRAMUSCULAR | Status: DC | PRN
  Administered 2016-12-27: 150 ug via INTRAVENOUS
  Administered 2016-12-27: 50 ug via INTRAVENOUS

## 2016-12-27 MED ORDER — ROPIVACAINE HCL 5 MG/ML IJ SOLN
INTRAMUSCULAR | Status: DC | PRN
  Administered 2016-12-27: 30 mL via PERINEURAL

## 2016-12-27 MED ORDER — CEFAZOLIN SODIUM-DEXTROSE 2-4 GM/100ML-% IV SOLN
INTRAVENOUS | Status: AC
  Filled 2016-12-27: qty 100

## 2016-12-27 MED ORDER — PROPOFOL 10 MG/ML IV BOLUS
INTRAVENOUS | Status: DC | PRN
  Administered 2016-12-27: 200 mg via INTRAVENOUS

## 2016-12-27 MED ORDER — CHLORHEXIDINE GLUCONATE 4 % EX LIQD: 60.0000 mL | Freq: Once | CUTANEOUS | Status: DC

## 2016-12-27 MED ORDER — FENTANYL CITRATE (PF) 100 MCG/2ML IJ SOLN
100.0000 ug | Freq: Once | INTRAMUSCULAR | Status: AC
  Administered 2016-12-27: 100 ug via INTRAVENOUS

## 2016-12-27 MED ORDER — DEXAMETHASONE SODIUM PHOSPHATE 10 MG/ML IJ SOLN
INTRAMUSCULAR | Status: DC | PRN
  Administered 2016-12-27: 10 mg via INTRAVENOUS

## 2016-12-27 MED ORDER — SODIUM CHLORIDE 0.9 % IR SOLN
Status: DC | PRN
  Administered 2016-12-27 (×2): 3000 mL

## 2016-12-27 MED ORDER — OXYCODONE HCL 5 MG PO TABS
ORAL_TABLET | ORAL | Status: AC
  Administered 2016-12-27: 20 mg via ORAL
  Filled 2016-12-27: qty 4

## 2016-12-27 MED ORDER — CEFAZOLIN SODIUM-DEXTROSE 2-4 GM/100ML-% IV SOLN
2.0000 g | INTRAVENOUS | Status: AC
  Administered 2016-12-27: 2 g via INTRAVENOUS

## 2016-12-27 MED ORDER — LACTATED RINGERS IV SOLN
INTRAVENOUS | Status: DC
  Administered 2016-12-27: 10:00:00 via INTRAVENOUS

## 2016-12-27 SURGICAL SUPPLY — 44 items
BLADE GREAT WHITE 4.2 (BLADE) ×4 IMPLANT
BLADE GREAT WHITE 4.2MM (BLADE) ×2
BUR VERTEX HOODED 4.5 (BURR) ×6 IMPLANT
CANNULA SHOULDER 7CM (CANNULA) ×3 IMPLANT
CANNULA TWIST IN 8.25X7CM (CANNULA) IMPLANT
COVER SURGICAL LIGHT HANDLE (MISCELLANEOUS) IMPLANT
DRAPE ORTHO SPLIT 77X108 STRL (DRAPES) ×4
DRAPE STERI 35X30 U-POUCH (DRAPES) ×3 IMPLANT
DRAPE SURG ORHT 6 SPLT 77X108 (DRAPES) ×2 IMPLANT
DRAPE U-SHAPE 47X51 STRL (DRAPES) ×3 IMPLANT
DRSG EMULSION OIL 3X3 NADH (GAUZE/BANDAGES/DRESSINGS) ×3 IMPLANT
DRSG PAD ABDOMINAL 8X10 ST (GAUZE/BANDAGES/DRESSINGS) ×3 IMPLANT
DURAPREP 26ML APPLICATOR (WOUND CARE) ×3 IMPLANT
GAUZE SPONGE 4X4 12PLY STRL (GAUZE/BANDAGES/DRESSINGS) ×3 IMPLANT
GAUZE SPONGE 4X4 12PLY STRL LF (GAUZE/BANDAGES/DRESSINGS) ×3 IMPLANT
GLOVE BIO SURGEON STRL SZ8 (GLOVE) ×3 IMPLANT
GLOVE BIOGEL PI IND STRL 8 (GLOVE) ×2 IMPLANT
GLOVE BIOGEL PI INDICATOR 8 (GLOVE) ×4
GLOVE SS N UNI LF 8.0 STRL (GLOVE) ×3 IMPLANT
GOWN STRL REUS W/ TWL LRG LVL3 (GOWN DISPOSABLE) ×1 IMPLANT
GOWN STRL REUS W/ TWL XL LVL3 (GOWN DISPOSABLE) ×2 IMPLANT
GOWN STRL REUS W/TWL LRG LVL3 (GOWN DISPOSABLE) ×2
GOWN STRL REUS W/TWL XL LVL3 (GOWN DISPOSABLE) ×4
KIT BASIN OR (CUSTOM PROCEDURE TRAY) ×3 IMPLANT
KIT ROOM TURNOVER OR (KITS) ×3 IMPLANT
MANIFOLD NEPTUNE II (INSTRUMENTS) ×3 IMPLANT
NEEDLE 22X1 1/2 (OR ONLY) (NEEDLE) IMPLANT
NS IRRIG 1000ML POUR BTL (IV SOLUTION) ×3 IMPLANT
PACK ARTHROSCOPY DSU (CUSTOM PROCEDURE TRAY) ×3 IMPLANT
PAD ABD 8X10 STRL (GAUZE/BANDAGES/DRESSINGS) ×6 IMPLANT
PAD ARMBOARD 7.5X6 YLW CONV (MISCELLANEOUS) ×6 IMPLANT
PROBE BIPOLAR ATHRO 135MM 90D (MISCELLANEOUS) ×3 IMPLANT
SET ARTHROSCOPY TUBING (MISCELLANEOUS) ×4
SET ARTHROSCOPY TUBING LN (MISCELLANEOUS) ×2 IMPLANT
SLING ARM FOAM STRAP XLG (SOFTGOODS) ×3 IMPLANT
SPONGE LAP 4X18 X RAY DECT (DISPOSABLE) ×3 IMPLANT
SUT ETHILON 3 0 PS 1 (SUTURE) ×3 IMPLANT
SYR CONTROL 10ML LL (SYRINGE) IMPLANT
TOWEL OR 17X24 6PK STRL BLUE (TOWEL DISPOSABLE) ×3 IMPLANT
TOWEL OR 17X26 10 PK STRL BLUE (TOWEL DISPOSABLE) ×3 IMPLANT
TUBE CONNECTING 12'X1/4 (SUCTIONS) ×1
TUBE CONNECTING 12X1/4 (SUCTIONS) ×2 IMPLANT
WAND HAND CNTRL MULTIVAC 90 (MISCELLANEOUS) ×3 IMPLANT
WATER STERILE IRR 1000ML POUR (IV SOLUTION) ×3 IMPLANT

## 2016-12-27 NOTE — Anesthesia Procedure Notes (Signed)
Anesthesia Regional Block: Supraclavicular block   Pre-Anesthetic Checklist: ,, timeout performed, Correct Patient, Correct Site, Correct Laterality, Correct Procedure, Correct Position, site marked, Risks and benefits discussed,  Surgical consent,  Pre-op evaluation,  At surgeon's request and post-op pain management  Laterality: Left and Upper  Prep: Maximum Sterile Barrier Precautions used, chloraprep       Needles:  Injection technique: Single-shot  Needle Type: Echogenic Stimulator Needle     Needle Length: 10cm      Additional Needles:   Procedures: ultrasound guided,,,,,,,,  Narrative:  Start time: 12/27/2016 9:27 AM End time: 12/27/2016 9:32 AM Injection made incrementally with aspirations every 5 mL.  Performed by: Personally  Anesthesiologist: Phillips Grout  Additional Notes: Risks, benefits and alternative to block explained extensively.  Patient tolerated procedure well, without complications.

## 2016-12-27 NOTE — Anesthesia Procedure Notes (Signed)
Procedure Name: Intubation Date/Time: 12/27/2016 12:03 PM Performed by: Merdis Delay Pre-anesthesia Checklist: Patient identified, Emergency Drugs available, Suction available, Timeout performed and Patient being monitored Patient Re-evaluated:Patient Re-evaluated prior to inductionOxygen Delivery Method: Circle system utilized Preoxygenation: Pre-oxygenation with 100% oxygen Intubation Type: IV induction Ventilation: Mask ventilation without difficulty Laryngoscope Size: Mac and 4 Grade View: Grade II Tube type: Oral Tube size: 7.5 mm Number of attempts: 1 Placement Confirmation: ETT inserted through vocal cords under direct vision,  positive ETCO2 and breath sounds checked- equal and bilateral Secured at: 22 cm Tube secured with: Tape Dental Injury: Teeth and Oropharynx as per pre-operative assessment

## 2016-12-27 NOTE — Op Note (Signed)
#  [id number] 

## 2016-12-27 NOTE — Interval H&P Note (Signed)
History and Physical Interval Note:  12/27/2016 10:08 AM  Peter Thompson  has presented today for surgery, with the diagnosis of LEFT SHOULDER IMPINGEMENT AND AC PAIN  The various methods of treatment have been discussed with the patient and family. After consideration of risks, benefits and other options for treatment, the patient has consented to  Procedure(s) with comments: ARTHROSCOPY SHOULDER (Left) - Patient is a difficult stick for IV's as a surgical intervention .  The patient's history has been reviewed, patient examined, no change in status, stable for surgery.  I have reviewed the patient's chart and labs.  Questions were answered to the patient's satisfaction.     , G

## 2016-12-27 NOTE — Transfer of Care (Signed)
Immediate Anesthesia Transfer of Care Note  Patient: Peter Thompson  Procedure(s) Performed: Procedure(s) with comments: ARTHROSCOPY SHOULDER (Left) - Debridement, AC Decompression, Acromioplasty   Patient Location: PACU  Anesthesia Type:General  Level of Consciousness: awake, alert  and oriented  Airway & Oxygen Therapy: Patient Spontanous Breathing  Post-op Assessment: Report given to RN and Post -op Vital signs reviewed and stable  Post vital signs: Reviewed and stable  Last Vitals:  Vitals:   12/27/16 0935 12/27/16 0937  BP: 122/76 128/64  Pulse: (!) 53 (!) 58  Resp: 13 13  Temp:      Last Pain:  Vitals:   12/27/16 0930  TempSrc:   PainSc: 5          Complications: No apparent anesthesia complications

## 2016-12-27 NOTE — Anesthesia Preprocedure Evaluation (Signed)
Anesthesia Evaluation  Patient identified by MRN, date of birth, ID band Patient awake    Reviewed: Allergy & Precautions, NPO status , Patient's Chart, lab work & pertinent test results  Airway Mallampati: III  TM Distance: >3 FB Neck ROM: Full    Dental no notable dental hx.    Pulmonary sleep apnea , Current Smoker,    Pulmonary exam normal breath sounds clear to auscultation       Cardiovascular hypertension, Pt. on medications Normal cardiovascular exam Rhythm:Regular Rate:Normal     Neuro/Psych negative neurological ROS  negative psych ROS   GI/Hepatic negative GI ROS, Neg liver ROS,   Endo/Other  negative endocrine ROS  Renal/GU negative Renal ROS  negative genitourinary   Musculoskeletal negative musculoskeletal ROS (+)   Abdominal   Peds negative pediatric ROS (+)  Hematology negative hematology ROS (+)   Anesthesia Other Findings   Reproductive/Obstetrics negative OB ROS                             Anesthesia Physical Anesthesia Plan  ASA: II  Anesthesia Plan: General   Post-op Pain Management:  Regional for Post-op pain   Induction: Intravenous  Airway Management Planned: Oral ETT  Additional Equipment:   Intra-op Plan:   Post-operative Plan: Extubation in OR  Informed Consent: I have reviewed the patients History and Physical, chart, labs and discussed the procedure including the risks, benefits and alternatives for the proposed anesthesia with the patient or authorized representative who has indicated his/her understanding and acceptance.   Dental advisory given  Plan Discussed with: CRNA  Anesthesia Plan Comments:         Anesthesia Quick Evaluation

## 2016-12-28 ENCOUNTER — Encounter (HOSPITAL_COMMUNITY): Payer: Self-pay | Admitting: Orthopaedic Surgery

## 2016-12-28 NOTE — Op Note (Signed)
NAME:  Peter Thompson, Peter Thompson                  ACCOUNT NO.:  MEDICAL RECORD NO.:  [id number]  LOCATION:                                 FACILITY:  PHYSICIAN:  Lubertha Basque. Jerl Santos, M.D.     DATE OF BIRTH:  DATE OF PROCEDURE:  12/27/2016 DATE OF DISCHARGE:                              OPERATIVE REPORT   PREOPERATIVE DIAGNOSES: 1. Left shoulder impingement. 2. Left shoulder partial rotator cuff tear. 3. Left shoulder acromioclavicular degeneration.  POSTOPERATIVE DIAGNOSES: 1. Left shoulder impingement. 2. Left shoulder partial rotator cuff tear. 3. Left shoulder acromioclavicular degeneration.  PROCEDURES PERFORMED: 1. Left shoulder arthroscopic acromioplasty. 2. Left shoulder arthroscopic debridement. 3. Left shoulder arthroscopic AC resection.  ANESTHESIA:  General and block.  SURGEON:  Lubertha Basque. Jerl Santos, M.D.  ASSISTANT:  Elodia Florence, P.A.  INDICATION FOR PROCEDURE:  The patient is a 51 year old man with a long history of left shoulder pain.  This has persisted despite medication and therapy and an injection.  By MRI scan, he has some partial tearing of his rotator cuff with impingement related to [facility] degeneration and an unfavorable subacromial morphology.  He has pain with use of the arm and pain when he tries to rest at night.  He is offered an arthroscopy. Informed operative consent was obtained after discussion of possible complications including reaction to anesthesia and infection.  The patient was scheduled for this procedure in the main operating room due to difficulty with IV access.  SUMMARY OF FINDINGS AND PROCEDURE:  Under general anesthesia and a block, a left shoulder arthroscopy was performed.  Glenohumeral joint showed no degenerative change with a biceps tendon that was partially torn and this was debrided, but about 90% of the tendon appeared normal. The rotator cuff had some partial thickness tearing seen from below, but no tear worthy of repair.  In the  subacromial space, he did have bone-on- bone contact of the [facility] joint and a formal AC resection was performed removing about a centimeter of the distal clavicle with the bur.  He also had a moderately prominent subacromial morphology addressed with an acromioplasty.  We then debrided the bursal aspect of the cuff, but again no tear worthy of repair was found.  He was scheduled to go home same day.  DESCRIPTION OF PROCEDURE:  The patient was taken to operating suite where general anesthetic was applied without difficulty.  He was also given a block in the pre-anesthesia area.  He was positioned in a beach- chair position and prepped and draped in normal sterile fashion.  After the administration of preop IV Kefzol and an appropriate time out, an arthroscopy of the left shoulder was performed through total of 3 portals.  Findings were as described above.  We performed the debridement of the biceps tendon and cuff along with a formal AC resection which was done with a bur in the anterior position.  Also performed the acromioplasty with the bur in the lateral position followed by transfer of the bur to the posterior position.  After the debridement the shoulder was thoroughly irrigated followed by removal of arthroscopic equipment.  Portals were loosely reapproximated with nylon followed by Adaptic, dry  gauze, and tape.  Estimated blood loss and intraoperative fluids obtained from anesthesia records.  DISPOSITION:  The patient was extubated in operating room and taken to recovery room in stable condition.  He was to go home same-day and follow up in office closely.  I will contact him by phone tonight.     Lubertha Basque Jerl Santos, M.D.     PGD/MEDQ  D:  12/27/2016  T:  12/28/2016  Job:  [id number]

## 2016-12-28 NOTE — Anesthesia Postprocedure Evaluation (Signed)
Anesthesia Post Note  Patient: Peter Thompson  Procedure(s) Performed: Procedure(s) (LRB): ARTHROSCOPY SHOULDER (Left)  Patient location during evaluation: PACU Anesthesia Type: General and Regional Level of consciousness: awake and alert Pain management: pain level controlled Vital Signs Assessment: post-procedure vital signs reviewed and stable Respiratory status: spontaneous breathing, nonlabored ventilation, respiratory function stable and patient connected to nasal cannula oxygen Cardiovascular status: blood pressure returned to baseline and stable Postop Assessment: no signs of nausea or vomiting Anesthetic complications: no        Last Vitals:  Vitals:   12/27/16 1320 12/27/16 1323  BP:  (!) 144/68  Pulse:  61  Resp:    Temp: 36.1 C     Last Pain:  Vitals:   12/27/16 1305  TempSrc:   PainSc: 7    Pain Goal: Patients Stated Pain Goal: 4 (12/27/16 1305)               Phillips Grout

## 2017-12-20 ENCOUNTER — Other Ambulatory Visit: Payer: Self-pay | Admitting: Orthopedic Surgery

## 2017-12-20 DIAGNOSIS — M542 Cervicalgia: Secondary | ICD-10-CM

## 2017-12-20 DIAGNOSIS — M4696 Unspecified inflammatory spondylopathy, lumbar region: Secondary | ICD-10-CM

## 2018-01-15 ENCOUNTER — Other Ambulatory Visit: Payer: Self-pay | Admitting: Orthopedic Surgery

## 2018-01-15 ENCOUNTER — Other Ambulatory Visit: Payer: Self-pay

## 2018-01-15 DIAGNOSIS — M542 Cervicalgia: Secondary | ICD-10-CM

## 2018-01-16 ENCOUNTER — Ambulatory Visit
Admission: RE | Admit: 2018-01-16 | Discharge: 2018-01-16 | Disposition: A | Discharge status: Home / Self Care | Payer: Medicaid Other | Source: Ambulatory Visit | Attending: Orthopedic Surgery | Admitting: Orthopedic Surgery

## 2018-01-16 DIAGNOSIS — M542 Cervicalgia: Secondary | ICD-10-CM

## 2018-08-21 ENCOUNTER — Ambulatory Visit (HOSPITAL_COMMUNITY): Admit: 2018-08-21 | Payer: Medicaid Other | Admitting: Orthopaedic Surgery

## 2018-08-21 ENCOUNTER — Encounter (HOSPITAL_COMMUNITY): Payer: Self-pay

## 2018-08-21 SURGERY — ARTHROPLASTY, KNEE, TOTAL
Anesthesia: Choice | Laterality: Right

## 2018-09-19 ENCOUNTER — Other Ambulatory Visit: Payer: Self-pay | Admitting: Orthopaedic Surgery

## 2018-09-25 ENCOUNTER — Encounter (HOSPITAL_COMMUNITY)
Admission: RE | Admit: 2018-09-25 | Discharge: 2018-09-25 | Disposition: A | Discharge status: Home / Self Care | Payer: Medicaid Other | Source: Ambulatory Visit | Attending: Orthopaedic Surgery | Admitting: Orthopaedic Surgery

## 2018-09-25 ENCOUNTER — Encounter (HOSPITAL_COMMUNITY): Payer: Self-pay

## 2018-09-25 ENCOUNTER — Other Ambulatory Visit: Payer: Self-pay

## 2018-09-25 DIAGNOSIS — I517 Cardiomegaly: Secondary | ICD-10-CM | POA: Insufficient documentation

## 2018-09-25 DIAGNOSIS — Z01818 Encounter for other preprocedural examination: Secondary | ICD-10-CM | POA: Diagnosis not present

## 2018-09-25 HISTORY — DX: Adverse effect of unspecified anesthetic, initial encounter: T41.45XA

## 2018-09-25 HISTORY — DX: Concussion with loss of consciousness status unknown, initial encounter: S06.0XAA

## 2018-09-25 HISTORY — DX: Other complications of anesthesia, initial encounter: T88.59XA

## 2018-09-25 HISTORY — DX: Concussion with loss of consciousness of unspecified duration, initial encounter: S06.0X9A

## 2018-09-25 LAB — CBC WITH DIFFERENTIAL/PLATELET
Abs Immature Granulocytes: 0.02 10*3/uL (ref 0.00–0.07)
BASOS PCT: 1 %
Basophils Absolute: 0.1 10*3/uL (ref 0.0–0.1)
Eosinophils Absolute: 0.2 10*3/uL (ref 0.0–0.5)
Eosinophils Relative: 3 %
HCT: 43.5 % (ref 39.0–52.0)
Hemoglobin: 14.3 g/dL (ref 13.0–17.0)
IMMATURE GRANULOCYTES: 0 %
Lymphocytes Relative: 19 %
Lymphs Abs: 1.3 10*3/uL (ref 0.7–4.0)
MCH: 30.8 pg (ref 26.0–34.0)
MCHC: 32.9 g/dL (ref 30.0–36.0)
MCV: 93.5 fL (ref 80.0–100.0)
Monocytes Absolute: 0.9 10*3/uL (ref 0.1–1.0)
Monocytes Relative: 13 %
NEUTROS PCT: 64 %
NRBC: 0 % (ref 0.0–0.2)
Neutro Abs: 4.2 10*3/uL (ref 1.7–7.7)
Platelets: 220 10*3/uL (ref 150–400)
RBC: 4.65 MIL/uL (ref 4.22–5.81)
RDW: 12.5 % (ref 11.5–15.5)
WBC: 6.6 10*3/uL (ref 4.0–10.5)

## 2018-09-25 LAB — BASIC METABOLIC PANEL
Anion gap: 11 (ref 5–15)
BUN: 8 mg/dL (ref 6–20)
CO2: 27 mmol/L (ref 22–32)
Calcium: 9.6 mg/dL (ref 8.9–10.3)
Chloride: 100 mmol/L (ref 98–111)
Creatinine, Ser: 0.68 mg/dL (ref 0.61–1.24)
GFR calc non Af Amer: >60 mL/min (ref >60)
Glucose, Bld: 119 mg/dL — ABNORMAL HIGH (ref 70–99)
Potassium: 3.5 mmol/L (ref 3.5–5.1)
Sodium: 138 mmol/L (ref 135–145)

## 2018-09-25 LAB — URINALYSIS, ROUTINE W REFLEX MICROSCOPIC
Bilirubin Urine: NEGATIVE
Glucose, UA: NEGATIVE mg/dL
Hgb urine dipstick: NEGATIVE
Ketones, ur: NEGATIVE mg/dL
Leukocytes, UA: NEGATIVE
Nitrite: NEGATIVE
Protein, ur: NEGATIVE mg/dL
SPECIFIC GRAVITY, URINE: 1.008 (ref 1.005–1.030)
pH: 8 (ref 5.0–8.0)

## 2018-09-25 LAB — APTT: aPTT: 33 seconds (ref 24–36)

## 2018-09-25 LAB — TYPE AND SCREEN
ABO/RH(D): O POS
Antibody Screen: NEGATIVE

## 2018-09-25 LAB — SURGICAL PCR SCREEN
MRSA, PCR: NEGATIVE
Staphylococcus aureus: NEGATIVE

## 2018-09-25 LAB — PROTIME-INR
INR: 0.99
Prothrombin Time: 13 seconds (ref 11.4–15.2)

## 2018-09-25 NOTE — Progress Notes (Addendum)
PCP - Dr Same Liliane Channel  Cardiologist -  Patient  saw Dr. Virginia Rochester in 2018 Chest x-ray -   EKG - 1/14/22020  Stress Test - 2018- normal  ECHO - 2018  Cardiac Cath - no   Sleep Study - na CPAP - na LABS-14/14/2020   ASA- Stop today  HA1C-0 Fasting Blood Sugar -  Checks Blood Sugar __0___ times a day  Anesthesia-  Pt denies having chest pain, sob, or fever at this time. All instructions explained to the pt, with a verbal understanding of the material. Pt agrees to go over the instructions while at home for a better understanding. The opportunity to ask questions was provided.  Patient had pre-opcardiac clearance in 2018.  Patient reports that he has not needed to follow up with cardiologist.  I requested records from PCP Dr Jenne Campus

## 2018-09-25 NOTE — Progress Notes (Signed)
   09/25/18 1824  OBSTRUCTIVE SLEEP APNEA  Have you ever been diagnosed with sleep apnea through a sleep study? No  Do you snore loudly (loud enough to be heard through closed doors)?  1  Do you often feel tired, fatigued, or sleepy during the daytime (such as falling asleep during driving or talking to someone)? 0  Has anyone observed you stop breathing during your sleep? 1  Do you have, or are you being treated for high blood pressure? 1  BMI more than 35 kg/m2? 1  Age > 50 (1-yes) 1  Neck circumference greater than:Male 16 inches or larger, Male 17inches or larger? 1  Male Gender (Yes=1) 1  Obstructive Sleep Apnea Score 7

## 2018-09-25 NOTE — Pre-Procedure Instructions (Addendum)
Peter Thompson  09/25/2018    Your procedure is scheduled on Tuesday, January 21.  Report to [facility] Admitting at 10:30 AM                Your surgery or procedure is scheduled for 12:30 PM   Call this number if you have problems the morning of surgery: [phone]  This is the number for the Pre- Surgical Desk.               For any other questions, please call [phone], Monday - Friday 8 AM - 4 PM.    Remember:  Do not eat or drink after midnight Monday, January 20.   Take these medicines the morning of surgery with A SIP OF WATER : amLODipine (NORVASC)  cloNIDine (CATAPRES) hydrALAZINE (APRESOLINE) Labetalol   Take if needed : oxyCODONE (ROXICODONE)   Follow your surgeon's instructions regarding Aspirin (hold or continue) 1 Week prior to surgery STOP taking  Aspirin Products (Goody Powder, Excedrin Migraine), Ibuprofen (Advil), Naproxen (Aleve), Vitamins and Herbal Products (ie Fish Oil).  Special instructions:   [place]- Preparing For Surgery  Before surgery, you can play an important role. Because skin is not sterile, your skin needs to be as free of germs as possible. You can reduce the number of germs on your skin by washing with CHG (chlorahexidine gluconate) Soap before surgery.  CHG is an antiseptic cleaner which kills germs and bonds with the skin to continue killing germs even after washing.    Oral Hygiene is also important to reduce your risk of infection.  Remember - BRUSH YOUR TEETH THE MORNING OF SURGERY WITH YOUR REGULAR TOOTHPASTE  Please do not use if you have an allergy to CHG or antibacterial soaps. If your skin becomes reddened/irritated stop using the CHG.  Do not shave (including legs and underarms) for at least 48 hours prior to first CHG shower. It is OK to shave your face.  Please follow these instructions carefully.   1. Shower the NIGHT BEFORE SURGERY and the MORNING OF SURGERY with CHG.   2. If you chose to wash your  hair, wash your hair first as usual with your normal shampoo.  3. After you shampoo, wash your face and private area with the soap you use at home, then rinse your hair and body thoroughly to remove the shampoo and soap.  4. Use CHG as you would any other liquid soap. You can apply CHG directly to the skin and wash gently with a scrungie or a clean washcloth.   5. Apply the CHG Soap to your body ONLY FROM THE NECK DOWN.  Do not use on open wounds or open sores. Avoid contact with your eyes, ears, mouth and genitals (private parts). Wash thoroughly, paying special attention to the area where your surgery will be performed.  6. Thoroughly rinse your body with warm water from the neck down.  7. DO NOT shower/wash with your normal soap after using and rinsing off the CHG Soap.  8. Pat yourself dry with a CLEAN TOWEL.  9. Wear CLEAN PAJAMAS to bed the night before surgery, wear comfortable clothes the morning of surgery  10. Place CLEAN SHEETS on your bed the night of your first shower and DO NOT SLEEP WITH PETS.   Day of Surgery: Shower as Instructed above  Do not wear lotions, powders, or perfumes, or deodorant. Please wear clean clothes to the hospital/surgery center.   Remember to  brush your teeth WITH YOUR REGULAR TOOTHPASTE.   Do not wear jewelry, make-up or nail polish  Do not shave 48 hours prior to surgery.  Men may shave face and neck.  Do not bring valuables to the hospital.  [facility] is not responsible for any belongings or valuables.  Contacts, dentures or bridgework may not be worn into surgery.  Leave your suitcase in the car.  After surgery it may be brought to your room.  For patients admitted to the hospital, discharge time will be determined by your treatment team.  Patients discharged the day of surgery will not be allowed to drive home.   Please read over the following fact sheets that you were given: Pain Booklet, Patient Instructions for Mupirocin Application,  Coughing and Deep Breathing, Surgical Site Infections, Joint Surgery

## 2018-09-26 NOTE — Anesthesia Preprocedure Evaluation (Addendum)
Anesthesia Evaluation  Patient identified by MRN, date of birth, ID band Patient awake    Reviewed: Allergy & Precautions, H&P , NPO status , Patient's Chart, lab work & pertinent test results, reviewed documented beta blocker date and time   Airway Mallampati: II  TM Distance: >3 FB Neck ROM: Full    Dental no notable dental hx. (+) Teeth Intact, Dental Advisory Given   Pulmonary Current Smoker,    Pulmonary exam normal breath sounds clear to auscultation       Cardiovascular hypertension, Pt. on medications and Pt. on home beta blockers  Rhythm:Regular Rate:Normal     Neuro/Psych Anxiety negative neurological ROS     GI/Hepatic negative GI ROS, Neg liver ROS,   Endo/Other  negative endocrine ROS  Renal/GU negative Renal ROS  negative genitourinary   Musculoskeletal  (+) Arthritis , Osteoarthritis,    Abdominal   Peds  Hematology negative hematology ROS (+)   Anesthesia Other Findings   Reproductive/Obstetrics negative OB ROS                           Anesthesia Physical Anesthesia Plan  ASA: III  Anesthesia Plan: Spinal and MAC   Post-op Pain Management:  Regional for Post-op pain   Induction: Intravenous  PONV Risk Score and Plan: 1 and Propofol infusion, Midazolam, Ondansetron and Dexamethasone  Airway Management Planned: Simple Face Mask  Additional Equipment:   Intra-op Plan:   Post-operative Plan:   Informed Consent: I have reviewed the patients History and Physical, chart, labs and discussed the procedure including the risks, benefits and alternatives for the proposed anesthesia with the patient or authorized representative who has indicated his/her understanding and acceptance.     Dental advisory given  Plan Discussed with: CRNA  Anesthesia Plan Comments: (See PAT note by Karoline Caldwell, PA-C )       Anesthesia Quick Evaluation

## 2018-09-26 NOTE — Progress Notes (Signed)
Anesthesia Chart Review:  Case:  [id number] Date/Time:  10/02/18 1217   Procedure:  TOTAL KNEE ARTHROPLASTY (Right )   Anesthesia type:  Choice   Pre-op diagnosis:  RIGHT KNEE DEGENERATIVE JOINT DISEASE   [facility]:  [facility] 07 / [facility]   Surgeon:  Marcene Corning, MD      DISCUSSION: 53 yo male current smoker. Pertinent hx includes HTN, Anxiety.  Pt was seen by Dr. Bing Matter for clearance prior to shoulder arthroscopy in 2018. Per Dr. Vanetta Shawl note "He has never had any heart trouble but within the last 10 years he had difficulties with high blood pressure.  He required multiple medications for his blood pressure.  He meets criteria for multidrug-resistant hypertension.  Never had any testing on his heart done.  His ability to exercise is limited because of orthopedic issues with his knees and hips.  I think he needs to have an echocardiogram to assess left ventricle hypertrophy, EKG will be done as well for the same process.  I will ask him to have a stress test to rule out any inducible ischemia."  Pt had echo 11/29/16 that showed borderline concentric LVH, EF 60-65%, RV moderately enlarged, LA mildly dilated, Mild tricuspid regurgitation. He had a stress test 12/06/16 that showed no ischemia, EF 62%.  Pt denied any CV symptoms at PAT appt. Given that he has no history of CVD and had benign workup in 2018 I anticipate he can proceed as planned barring acute status change.   VS: BP (!) 142/72   Pulse (!) 51   Temp (!) 36.3 C   Resp 20   Ht 5' 9.75" (1.772 m)   Wt 115.4 kg   SpO2 96%   BMI 36.76 kg/m   PROVIDERS: Quitman Livings, MD is PCP   LABS: Labs reviewed: Acceptable for surgery. (all labs ordered are listed, but only abnormal results are displayed)  Labs Reviewed  BASIC METABOLIC PANEL - Abnormal; Notable for the following components:      Result Value   Glucose, Bld 119 (*)    All other components within normal limits  SURGICAL PCR SCREEN  APTT  CBC WITH  DIFFERENTIAL/PLATELET  PROTIME-INR  URINALYSIS, ROUTINE W REFLEX MICROSCOPIC  TYPE AND SCREEN     IMAGES: CHEST - 2 VIEW 09/25/2018  COMPARISON:  09/30/2016.  FINDINGS: Stable enlarged cardiac silhouette. Clear lungs with normal vascularity. Cervical spine fixation wire.  IMPRESSION: No acute abnormality. Stable cardiomegaly.  EKG: 09/25/2018: Sinus bradycardia. Rate 51.   CV: Nuclear stress test 12/06/16 [facility] cardiology [place]):   No ischemia seen on the scan. Normal gated images. Normal EF 60%.  Echo 11/29/16 [facility] cardiology [place]): 1. LV size is normal. Borderline concentric LVH. Overall LV systolic function normal. EF 60-65%. 2. RV moderately enlarged. 3. LA mildly dilated. 4. Mild tricuspid regurgitation.   Past Medical History:  Diagnosis Date  . Anxiety   . Arthritis   . Bursitis   . Complication of anesthesia    "woke up in OR- IV came out."  . Concussion    several- head injury- was in milatary  . DJD (degenerative joint disease)   . Hypertension   . Insomnia   . Neck fracture (HCC)   . Tinnitus, bilateral     Past Surgical History:  Procedure Laterality Date  . ANKLE SURGERY Left   . Arm surgery Right    fracture repair  . CERVICAL SPINE SURGERY     bone graft from left hip  .  HAND SURGERY Left    drains for Infection  . HIP SURGERY Left    "scrapped" x2  . KNEE ARTHROSCOPY Right   . LEG SURGERY Left    "1 inch took out"  . SHOULDER ARTHROSCOPY Left 12/27/2016   Procedure: ARTHROSCOPY SHOULDER;  Surgeon: Marcene Corning, MD;  Location: [facility] OR;  Service: Orthopedics;  Laterality: Left;  Debridement, AC Decompression, Acromioplasty   . TOTAL HIP ARTHROPLASTY Left 05/10/2016   Procedure: TOTAL HIP ARTHROPLASTY ANTERIOR APPROACH;  Surgeon: Marcene Corning, MD;  Location: [facility];  Service: Orthopedics;  Laterality: Left;  . WRIST SURGERY Right    2 Pinns    MEDICATIONS: . amLODipine (NORVASC) 10 MG tablet  . aspirin EC 325 MG EC  tablet  . cloNIDine (CATAPRES) 0.2 MG tablet  . hydrALAZINE (APRESOLINE) 100 MG tablet  . labetalol (NORMODYNE) 300 MG tablet  . lisinopril-hydrochlorothiazide (PRINZIDE,ZESTORETIC) 20-25 MG tablet  . Multiple Vitamins-Minerals (MULTIVITAMIN WITH MINERALS) tablet  . oxyCODONE (ROXICODONE) 15 MG immediate release tablet  . Oxycodone HCl 10 MG TABS  . potassium chloride (K-DUR) 10 MEQ tablet  . triamcinolone cream (KENALOG) 0.1 %   No current facility-administered medications for this encounter.      Zannie Cove [facility] Short Stay Center/Anesthesiology Phone [phone] 09/26/2018 3:05 PM

## 2018-10-01 MED ORDER — TRANEXAMIC ACID 1000 MG/10ML IV SOLN
2000.0000 mg | INTRAVENOUS | Status: AC
  Administered 2018-10-02: 2000 mg via TOPICAL
  Filled 2018-10-01: qty 20

## 2018-10-01 MED ORDER — TRANEXAMIC ACID-NACL 1000-0.7 MG/100ML-% IV SOLN
1000.0000 mg | INTRAVENOUS | Status: AC
  Administered 2018-10-02: 1000 mg via INTRAVENOUS
  Filled 2018-10-01: qty 100

## 2018-10-01 MED ORDER — BUPIVACAINE LIPOSOME 1.3 % IJ SUSP
20.0000 mL | INTRAMUSCULAR | Status: AC
  Administered 2018-10-02: 20 mL
  Filled 2018-10-01: qty 20

## 2018-10-01 NOTE — H&P (Signed)
TOTAL KNEE ADMISSION H&P  Patient is being admitted for right total knee arthroplasty.  Subjective:  Chief Complaint:right knee pain.  HPI: Peter Thompson, 53 y.o. male, has a history of pain and functional disability in the right knee due to arthritis and has failed non-surgical conservative treatments for greater than 12 weeks to includeNSAID's and/or analgesics, corticosteriod injections, flexibility and strengthening excercises, use of assistive devices, weight reduction as appropriate and activity modification.  Onset of symptoms was gradual, starting 5 years ago with gradually worsening course since that time. The patient noted prior procedures on the knee to include  arthroscopy on the right knee(s).  Patient currently rates pain in the right knee(s) at 10 out of 10 with activity. Patient has night pain, worsening of pain with activity and weight bearing, pain that interferes with activities of daily living, crepitus and joint swelling.  Patient has evidence of subchondral cysts, subchondral sclerosis, periarticular osteophytes and joint space narrowing by imaging studies. There is no active infection.  Patient Active Problem List   Diagnosis Date Noted  . Primary localized osteoarthritis of left hip 05/10/2016  . Primary osteoarthritis of left hip 05/10/2016   Past Medical History:  Diagnosis Date  . Anxiety   . Arthritis   . Bursitis   . Complication of anesthesia    "woke up in OR- IV came out."  . Concussion    several- head injury- was in [facility]  . DJD (degenerative joint disease)   . Hypertension   . Insomnia   . Neck fracture (HCC)   . Tinnitus, bilateral     Past Surgical History:  Procedure Laterality Date  . ANKLE SURGERY Left   . Arm surgery Right    fracture repair  . CERVICAL SPINE SURGERY     bone graft from left hip  . HAND SURGERY Left    drains for Infection  . HIP SURGERY Left    "scrapped" x2  . KNEE ARTHROSCOPY Right   . LEG SURGERY Left    "1 inch took out"  . SHOULDER ARTHROSCOPY Left 12/27/2016   Procedure: ARTHROSCOPY SHOULDER;  Surgeon: Marcene CorningPeter Dalldorf, MD;  [facility];  Service: Orthopedics;  Laterality: Left;  Debridement, AC Decompression, Acromioplasty   . TOTAL HIP ARTHROPLASTY Left 05/10/2016   Procedure: TOTAL HIP ARTHROPLASTY ANTERIOR APPROACH;  Surgeon: Marcene CorningPeter Dalldorf, MD;  Location[facility];  Service: Orthopedics;  Laterality: Left;  . WRIST SURGERY Right    2 Pinns    Current Facility-Administered Medications  Medication Dose Route Frequency Provider Last Rate Last Dose  . [START ON 10/02/2018] bupivacaine liposome (EXPAREL) 1.3 % injection 266 mg  20 mL Infiltration To OR Marcene Corningalldorf, Peter, MD      . Melene Muller[START ON 10/02/2018] tranexamic acid (CYKLOKAPRON) 2,000 mg in sodium chloride 0.9 % 50 mL Topical Application  2,000 mg Topical To OR Marcene Corningalldorf, Peter, MD      . Melene Muller[START ON 10/02/2018] tranexamic acid (CYKLOKAPRON) IVPB 1,000 mg  1,000 mg Intravenous To OR Marcene Corningalldorf, Peter, MD       Current Outpatient Medications  Medication Sig Dispense Refill Last Dose  . amLODipine (NORVASC) 10 MG tablet Take 10 mg by mouth daily.   12/27/2016 at 0730  . aspirin EC 325 MG EC tablet Take 1 tablet (325 mg total) by mouth 2 (two) times daily after a meal. (Patient taking differently: Take 325 mg by mouth daily. ) 60 tablet 0 Past Week at Unknown time  . cloNIDine (CATAPRES) 0.2 MG tablet Take  0.2 mg by mouth 3 (three) times daily.   12/27/2016 at 0730  . hydrALAZINE (APRESOLINE) 100 MG tablet Take 100 mg by mouth 3 (three) times daily.   12/26/2016 at Unknown time  . labetalol (NORMODYNE) 300 MG tablet Take 300 mg by mouth 2 (two) times daily.    12/27/2016 at 0730  . lisinopril-hydrochlorothiazide (PRINZIDE,ZESTORETIC) 20-25 MG tablet Take 1 tablet by mouth daily.   12/26/2016 at Unknown time  . Multiple Vitamins-Minerals (MULTIVITAMIN WITH MINERALS) tablet Take 1 tablet by mouth daily.     . Oxycodone HCl 10 MG TABS Take 1-2 tablets (10-20 mg  total) by mouth every 4 (four) hours as needed (severe pain). (Patient taking differently: Take 20 mg by mouth every 4 (four) hours as needed (severe pain). ) 30 tablet 0   . potassium chloride (K-DUR) 10 MEQ tablet Take 10 mEq by mouth daily.   12/26/2016 at Unknown time  . triamcinolone cream (KENALOG) 0.1 % Apply 1 application topically 2 (two) times daily.     Marland Kitchen oxyCODONE (ROXICODONE) 15 MG immediate release tablet Take 1 tablet (15 mg total) by mouth every 4 (four) hours as needed for pain. (Patient not taking: Reported on 09/25/2018) 40 tablet 0 Not Taking at Unknown time   No Known Allergies  Social History   Tobacco Use  . Smoking status: Current Every Day Smoker    Packs/day: 0.50    Years: 56.00    Pack years: 28.00    Types: Cigarettes  . Smokeless tobacco: Current User    Types: Chew  . Tobacco comment: occasional - chew  Substance Use Topics  . Alcohol use: Yes    Alcohol/week: 12.0 standard drinks    Types: 12 Cans of beer per week    No family history on file.   Review of Systems  Musculoskeletal: Positive for joint pain.       Right knee  All other systems reviewed and are negative.   Objective:  Physical Exam  Constitutional: He is oriented to person, place, and time. He appears well-developed and well-nourished.  HENT:  Head: Normocephalic and atraumatic.  Eyes: Pupils are equal, round, and reactive to light.  Neck: Normal range of motion.  Cardiovascular: Normal rate and regular rhythm.  Respiratory: Effort normal.  GI: Soft.  Musculoskeletal:     Comments: Right knee motion is only about 0-90.  He has medial joint line pain and a mild varus deformity.  I do not feel an effusion.   Hip motion is full and pain free and SLR is negative on both sides.  There is no palpable LAD behind either knee.  Sensation and motor function are intact on both sides and there are palpable pulses on both sides.   Neurological: He is alert and oriented to person, place, and  time.  Skin: Skin is warm and dry.  Psychiatric: He has a normal mood and affect. His behavior is normal. Judgment and thought content normal.    Vital signs in last 24 hours:    Labs:   Estimated body mass index is 36.76 kg/m as calculated from the following:   Height as of 09/25/18: 5' 9.75" (1.772 m).   Weight as of 09/25/18: 115.4 kg.   Imaging Review Plain radiographs demonstrate severe degenerative joint disease of the right knee(s). The overall alignment isneutral. The bone quality appears to be good for age and reported activity level.   Preoperative templating of the joint replacement has been completed, documented, and submitted to  the Operating Room personnel in order to optimize intra-operative equipment management.    Patient's anticipated LOS is less than 2 midnights, meeting these requirements: - Younger than 40 - Lives within 1 hour of care - Has a competent adult at home to recover with post-op recover - NO history of  - Chronic pain requiring opiods  - Diabetes  - Coronary Artery Disease  - Heart failure  - Heart attack  - Stroke  - DVT/VTE  - Cardiac arrhythmia  - Respiratory Failure/COPD  - Renal failure  - Anemia  - Advanced Liver disease        Assessment/Plan:  End stage primary arthritis, right knee   The patient history, physical examination, clinical judgment of the provider and imaging studies are consistent with end stage degenerative joint disease of the right knee(s) and total knee arthroplasty is deemed medically necessary. The treatment options including medical management, injection therapy arthroscopy and arthroplasty were discussed at length. The risks and benefits of total knee arthroplasty were presented and reviewed. The risks due to aseptic loosening, infection, stiffness, patella tracking problems, thromboembolic complications and other imponderables were discussed. The patient acknowledged the explanation, agreed to proceed  with the plan and consent was signed. Patient is being admitted for inpatient treatment for surgery, pain control, PT, OT, prophylactic antibiotics, VTE prophylaxis, progressive ambulation and ADL's and discharge planning. The patient is planning to be discharged home with home health services

## 2018-10-02 ENCOUNTER — Encounter (HOSPITAL_COMMUNITY): Payer: Self-pay | Admitting: *Deleted

## 2018-10-02 ENCOUNTER — Inpatient Hospital Stay (HOSPITAL_COMMUNITY)
Admission: RE | Admit: 2018-10-02 | Discharge: 2018-10-04 | DRG: 470 | Disposition: A | Discharge status: Home / Self Care | Payer: Medicaid Other | Attending: Orthopaedic Surgery | Admitting: Orthopaedic Surgery

## 2018-10-02 ENCOUNTER — Other Ambulatory Visit: Payer: Self-pay

## 2018-10-02 ENCOUNTER — Encounter (HOSPITAL_COMMUNITY)
Admission: RE | Disposition: A | Discharge status: Home / Self Care | Payer: Self-pay | Source: Home / Self Care | Attending: Orthopaedic Surgery

## 2018-10-02 ENCOUNTER — Ambulatory Visit (HOSPITAL_COMMUNITY): Payer: Medicaid Other | Admitting: Physician Assistant

## 2018-10-02 ENCOUNTER — Ambulatory Visit (HOSPITAL_COMMUNITY): Payer: Medicaid Other | Admitting: Certified Registered Nurse Anesthetist

## 2018-10-02 DIAGNOSIS — M1711 Unilateral primary osteoarthritis, right knee: Secondary | ICD-10-CM

## 2018-10-02 DIAGNOSIS — G4700 Insomnia, unspecified: Secondary | ICD-10-CM | POA: Diagnosis present

## 2018-10-02 DIAGNOSIS — F17210 Nicotine dependence, cigarettes, uncomplicated: Secondary | ICD-10-CM | POA: Diagnosis present

## 2018-10-02 DIAGNOSIS — I119 Hypertensive heart disease without heart failure: Secondary | ICD-10-CM | POA: Diagnosis present

## 2018-10-02 DIAGNOSIS — E669 Obesity, unspecified: Secondary | ICD-10-CM | POA: Diagnosis present

## 2018-10-02 DIAGNOSIS — Z79899 Other long term (current) drug therapy: Secondary | ICD-10-CM

## 2018-10-02 DIAGNOSIS — I4819 Other persistent atrial fibrillation: Secondary | ICD-10-CM

## 2018-10-02 DIAGNOSIS — F419 Anxiety disorder, unspecified: Secondary | ICD-10-CM | POA: Diagnosis present

## 2018-10-02 DIAGNOSIS — Z7982 Long term (current) use of aspirin: Secondary | ICD-10-CM

## 2018-10-02 DIAGNOSIS — Z96642 Presence of left artificial hip joint: Secondary | ICD-10-CM | POA: Diagnosis present

## 2018-10-02 DIAGNOSIS — F17220 Nicotine dependence, chewing tobacco, uncomplicated: Secondary | ICD-10-CM | POA: Diagnosis present

## 2018-10-02 DIAGNOSIS — Z6836 Body mass index (BMI) 36.0-36.9, adult: Secondary | ICD-10-CM

## 2018-10-02 DIAGNOSIS — G8929 Other chronic pain: Secondary | ICD-10-CM | POA: Diagnosis present

## 2018-10-02 DIAGNOSIS — H9313 Tinnitus, bilateral: Secondary | ICD-10-CM | POA: Diagnosis present

## 2018-10-02 HISTORY — PX: TOTAL KNEE ARTHROPLASTY: SHX125

## 2018-10-02 HISTORY — DX: Unilateral primary osteoarthritis, right knee: M17.11

## 2018-10-02 SURGERY — ARTHROPLASTY, KNEE, TOTAL
Anesthesia: Monitor Anesthesia Care | Laterality: Right

## 2018-10-02 MED ORDER — TRANEXAMIC ACID-NACL 1000-0.7 MG/100ML-% IV SOLN
1000.0000 mg | Freq: Once | INTRAVENOUS | Status: AC
  Administered 2018-10-02: 1000 mg via INTRAVENOUS
  Filled 2018-10-02: qty 100

## 2018-10-02 MED ORDER — ACETAMINOPHEN 500 MG PO TABS
ORAL_TABLET | ORAL | Status: AC
  Filled 2018-10-02: qty 2

## 2018-10-02 MED ORDER — ALUM & MAG HYDROXIDE-SIMETH 200-200-20 MG/5ML PO SUSP: 30.0000 mL | ORAL | Status: DC | PRN

## 2018-10-02 MED ORDER — METHOCARBAMOL 1000 MG/10ML IJ SOLN
500.0000 mg | Freq: Four times a day (QID) | INTRAVENOUS | Status: DC | PRN
  Filled 2018-10-02: qty 5

## 2018-10-02 MED ORDER — BUPIVACAINE-EPINEPHRINE (PF) 0.25% -1:200000 IJ SOLN
INTRAMUSCULAR | Status: AC
  Filled 2018-10-02: qty 30

## 2018-10-02 MED ORDER — LACTATED RINGERS IV SOLN
INTRAVENOUS | Status: DC
  Administered 2018-10-02: 21:00:00 via INTRAVENOUS

## 2018-10-02 MED ORDER — HYDROCHLOROTHIAZIDE 25 MG PO TABS
25.0000 mg | ORAL_TABLET | Freq: Every day | ORAL | Status: DC
  Administered 2018-10-03 – 2018-10-04 (×2): 25 mg via ORAL
  Filled 2018-10-02 (×2): qty 1

## 2018-10-02 MED ORDER — OXYCODONE HCL 5 MG PO TABS
ORAL_TABLET | ORAL | Status: AC
  Filled 2018-10-02: qty 4

## 2018-10-02 MED ORDER — LACTATED RINGERS IV SOLN
INTRAVENOUS | Status: DC
  Administered 2018-10-02 (×2): via INTRAVENOUS

## 2018-10-02 MED ORDER — ASPIRIN EC 325 MG PO TBEC
325.0000 mg | DELAYED_RELEASE_TABLET | Freq: Two times a day (BID) | ORAL | Status: DC
  Administered 2018-10-03 – 2018-10-04 (×2): 325 mg via ORAL
  Filled 2018-10-02 (×2): qty 1

## 2018-10-02 MED ORDER — SODIUM CHLORIDE 0.9 % IR SOLN
Status: DC | PRN
  Administered 2018-10-02: 3000 mL

## 2018-10-02 MED ORDER — FENTANYL CITRATE (PF) 100 MCG/2ML IJ SOLN: 100.0000 ug | Freq: Once | INTRAMUSCULAR | Status: DC

## 2018-10-02 MED ORDER — ONDANSETRON HCL 4 MG/2ML IJ SOLN
INTRAMUSCULAR | Status: DC | PRN
  Administered 2018-10-02: 4 mg via INTRAVENOUS

## 2018-10-02 MED ORDER — METHOCARBAMOL 500 MG PO TABS
ORAL_TABLET | ORAL | Status: AC
  Filled 2018-10-02: qty 1

## 2018-10-02 MED ORDER — PHENYLEPHRINE HCL 10 MG/ML IJ SOLN
INTRAMUSCULAR | Status: DC | PRN
  Administered 2018-10-02: 80 ug via INTRAVENOUS

## 2018-10-02 MED ORDER — ONDANSETRON HCL 4 MG/2ML IJ SOLN: 4.0000 mg | Freq: Four times a day (QID) | INTRAMUSCULAR | Status: DC | PRN

## 2018-10-02 MED ORDER — METHOCARBAMOL 500 MG PO TABS
500.0000 mg | ORAL_TABLET | Freq: Four times a day (QID) | ORAL | Status: DC | PRN
  Administered 2018-10-02 – 2018-10-04 (×6): 500 mg via ORAL
  Filled 2018-10-02 (×5): qty 1

## 2018-10-02 MED ORDER — CEFAZOLIN SODIUM-DEXTROSE 2-4 GM/100ML-% IV SOLN
2.0000 g | INTRAVENOUS | Status: AC
  Administered 2018-10-02: 2 g via INTRAVENOUS
  Filled 2018-10-02: qty 100

## 2018-10-02 MED ORDER — KETOROLAC TROMETHAMINE 15 MG/ML IJ SOLN
15.0000 mg | Freq: Four times a day (QID) | INTRAMUSCULAR | Status: AC
  Administered 2018-10-02 – 2018-10-03 (×4): 15 mg via INTRAVENOUS
  Filled 2018-10-02 (×4): qty 1

## 2018-10-02 MED ORDER — BISACODYL 5 MG PO TBEC: 5.0000 mg | DELAYED_RELEASE_TABLET | Freq: Every day | ORAL | Status: DC | PRN

## 2018-10-02 MED ORDER — LABETALOL HCL 300 MG PO TABS
300.0000 mg | ORAL_TABLET | Freq: Two times a day (BID) | ORAL | Status: DC
  Administered 2018-10-02 – 2018-10-04 (×4): 300 mg via ORAL
  Filled 2018-10-02 (×4): qty 1

## 2018-10-02 MED ORDER — HYDROMORPHONE HCL 1 MG/ML IJ SOLN
INTRAMUSCULAR | Status: AC
  Filled 2018-10-02: qty 1

## 2018-10-02 MED ORDER — CLONIDINE HCL 0.2 MG PO TABS
0.2000 mg | ORAL_TABLET | Freq: Three times a day (TID) | ORAL | Status: DC
  Administered 2018-10-02 – 2018-10-04 (×4): 0.2 mg via ORAL
  Filled 2018-10-02 (×5): qty 1

## 2018-10-02 MED ORDER — MIDAZOLAM HCL 2 MG/2ML IJ SOLN
2.0000 mg | Freq: Once | INTRAMUSCULAR | Status: AC
  Administered 2018-10-02: 2 mg via INTRAVENOUS

## 2018-10-02 MED ORDER — DEXAMETHASONE SODIUM PHOSPHATE 4 MG/ML IJ SOLN
INTRAMUSCULAR | Status: DC | PRN
  Administered 2018-10-02: 10 mg via INTRAVENOUS

## 2018-10-02 MED ORDER — POTASSIUM CHLORIDE CRYS ER 10 MEQ PO TBCR
10.0000 meq | EXTENDED_RELEASE_TABLET | Freq: Every day | ORAL | Status: DC
  Administered 2018-10-02 – 2018-10-04 (×3): 10 meq via ORAL
  Filled 2018-10-02 (×5): qty 1

## 2018-10-02 MED ORDER — HYDROMORPHONE HCL 1 MG/ML IJ SOLN
0.2500 mg | INTRAMUSCULAR | Status: DC | PRN
  Administered 2018-10-02 (×4): 0.5 mg via INTRAVENOUS

## 2018-10-02 MED ORDER — MIDAZOLAM HCL 2 MG/2ML IJ SOLN
INTRAMUSCULAR | Status: AC
  Filled 2018-10-02: qty 2

## 2018-10-02 MED ORDER — BUPIVACAINE-EPINEPHRINE 0.25% -1:200000 IJ SOLN
INTRAMUSCULAR | Status: DC | PRN
  Administered 2018-10-02: 30 mL

## 2018-10-02 MED ORDER — CEFAZOLIN SODIUM-DEXTROSE 2-4 GM/100ML-% IV SOLN
2.0000 g | Freq: Four times a day (QID) | INTRAVENOUS | Status: AC
  Administered 2018-10-02 – 2018-10-03 (×2): 2 g via INTRAVENOUS
  Filled 2018-10-02 (×2): qty 100

## 2018-10-02 MED ORDER — FENTANYL CITRATE (PF) 100 MCG/2ML IJ SOLN
INTRAMUSCULAR | Status: AC
  Administered 2018-10-02: 100 ug via INTRAVENOUS
  Filled 2018-10-02: qty 2

## 2018-10-02 MED ORDER — ACETAMINOPHEN 500 MG PO TABS
1000.0000 mg | ORAL_TABLET | Freq: Once | ORAL | Status: AC
  Administered 2018-10-02: 1000 mg via ORAL

## 2018-10-02 MED ORDER — MENTHOL 3 MG MT LOZG: 1.0000 | LOZENGE | OROMUCOSAL | Status: DC | PRN

## 2018-10-02 MED ORDER — PHENOL 1.4 % MT LIQD: 1.0000 | OROMUCOSAL | Status: DC | PRN

## 2018-10-02 MED ORDER — METOCLOPRAMIDE HCL 5 MG/ML IJ SOLN: 5.0000 mg | Freq: Three times a day (TID) | INTRAMUSCULAR | Status: DC | PRN

## 2018-10-02 MED ORDER — BUPIVACAINE IN DEXTROSE 0.75-8.25 % IT SOLN
INTRATHECAL | Status: DC | PRN
  Administered 2018-10-02: 1.8 mL via INTRATHECAL

## 2018-10-02 MED ORDER — 0.9 % SODIUM CHLORIDE (POUR BTL) OPTIME
TOPICAL | Status: DC | PRN
  Administered 2018-10-02: 1000 mL

## 2018-10-02 MED ORDER — HYDROMORPHONE HCL 1 MG/ML IJ SOLN
0.5000 mg | INTRAMUSCULAR | Status: DC | PRN
  Administered 2018-10-02 – 2018-10-04 (×11): 1 mg via INTRAVENOUS
  Filled 2018-10-02 (×11): qty 1

## 2018-10-02 MED ORDER — MIDAZOLAM HCL 2 MG/2ML IJ SOLN
INTRAMUSCULAR | Status: DC | PRN
  Administered 2018-10-02: 2 mg via INTRAVENOUS

## 2018-10-02 MED ORDER — LISINOPRIL-HYDROCHLOROTHIAZIDE 20-25 MG PO TABS: 1.0000 | ORAL_TABLET | Freq: Every day | ORAL | Status: DC

## 2018-10-02 MED ORDER — SODIUM CHLORIDE 0.9% FLUSH
INTRAVENOUS | Status: DC | PRN
  Administered 2018-10-02: 30 mL via INTRAVENOUS

## 2018-10-02 MED ORDER — PROPOFOL 500 MG/50ML IV EMUL
INTRAVENOUS | Status: DC | PRN
  Administered 2018-10-02 (×2): 100 ug/kg/min via INTRAVENOUS

## 2018-10-02 MED ORDER — AMLODIPINE BESYLATE 10 MG PO TABS
10.0000 mg | ORAL_TABLET | Freq: Every day | ORAL | Status: DC
  Administered 2018-10-03 – 2018-10-04 (×2): 10 mg via ORAL
  Filled 2018-10-02 (×2): qty 1

## 2018-10-02 MED ORDER — DIPHENHYDRAMINE HCL 12.5 MG/5ML PO ELIX: 12.5000 mg | ORAL_SOLUTION | ORAL | Status: DC | PRN

## 2018-10-02 MED ORDER — METOCLOPRAMIDE HCL 5 MG PO TABS: 5.0000 mg | ORAL_TABLET | Freq: Three times a day (TID) | ORAL | Status: DC | PRN

## 2018-10-02 MED ORDER — ACETAMINOPHEN 500 MG PO TABS
1000.0000 mg | ORAL_TABLET | Freq: Four times a day (QID) | ORAL | Status: AC
  Administered 2018-10-03 (×2): 1000 mg via ORAL
  Filled 2018-10-02 (×4): qty 2

## 2018-10-02 MED ORDER — BUPIVACAINE-EPINEPHRINE (PF) 0.5% -1:200000 IJ SOLN
INTRAMUSCULAR | Status: DC | PRN
  Administered 2018-10-02: 20 mL via PERINEURAL

## 2018-10-02 MED ORDER — HYDRALAZINE HCL 50 MG PO TABS
100.0000 mg | ORAL_TABLET | Freq: Three times a day (TID) | ORAL | Status: DC
  Administered 2018-10-02 – 2018-10-04 (×4): 100 mg via ORAL
  Filled 2018-10-02 (×5): qty 2

## 2018-10-02 MED ORDER — LISINOPRIL 20 MG PO TABS
20.0000 mg | ORAL_TABLET | Freq: Every day | ORAL | Status: DC
  Administered 2018-10-03 – 2018-10-04 (×2): 20 mg via ORAL
  Filled 2018-10-02 (×2): qty 1

## 2018-10-02 MED ORDER — OXYCODONE HCL 5 MG PO TABS
20.0000 mg | ORAL_TABLET | ORAL | Status: DC | PRN
  Administered 2018-10-02 – 2018-10-04 (×12): 20 mg via ORAL
  Filled 2018-10-02 (×10): qty 4

## 2018-10-02 MED ORDER — CHLORHEXIDINE GLUCONATE 4 % EX LIQD: 60.0000 mL | Freq: Once | CUTANEOUS | Status: DC

## 2018-10-02 MED ORDER — ACETAMINOPHEN 325 MG PO TABS: 325.0000 mg | ORAL_TABLET | Freq: Four times a day (QID) | ORAL | Status: DC | PRN

## 2018-10-02 MED ORDER — ONDANSETRON HCL 4 MG PO TABS: 4.0000 mg | ORAL_TABLET | Freq: Four times a day (QID) | ORAL | Status: DC | PRN

## 2018-10-02 MED ORDER — DOCUSATE SODIUM 100 MG PO CAPS
100.0000 mg | ORAL_CAPSULE | Freq: Two times a day (BID) | ORAL | Status: DC
  Administered 2018-10-03 – 2018-10-04 (×3): 100 mg via ORAL
  Filled 2018-10-02 (×4): qty 1

## 2018-10-02 SURGICAL SUPPLY — 63 items
ATTUNE MED DOME PAT 41 KNEE (Knees) ×2 IMPLANT
ATTUNE MED DOME PAT 41MM KNEE (Knees) ×1 IMPLANT
ATTUNE PS FEM RT SZ 7 CEM KNEE (Femur) ×3 IMPLANT
ATTUNE PSRP INSR SZ7 7 KNEE (Insert) ×2 IMPLANT
ATTUNE PSRP INSR SZ7 7MM KNEE (Insert) ×1 IMPLANT
BAG DECANTER FOR FLEXI CONT (MISCELLANEOUS) IMPLANT
BANDAGE ESMARK 6X9 LF (GAUZE/BANDAGES/DRESSINGS) ×1 IMPLANT
BASE TIBIAL ROT PLAT SZ 8 KNEE (Knees) ×1 IMPLANT
BLADE SAGITTAL 25.0X1.19X90 (BLADE) ×2 IMPLANT
BLADE SAGITTAL 25.0X1.19X90MM (BLADE) ×1
BLADE SAW SGTL 13.0X1.19X90.0M (BLADE) ×3 IMPLANT
BNDG ELASTIC 6X10 VLCR STRL LF (GAUZE/BANDAGES/DRESSINGS) ×3 IMPLANT
BNDG ELASTIC 6X15 VLCR STRL LF (GAUZE/BANDAGES/DRESSINGS) ×3 IMPLANT
BNDG ESMARK 6X9 LF (GAUZE/BANDAGES/DRESSINGS) ×3
BOWL SMART MIX CTS (DISPOSABLE) ×3 IMPLANT
CEMENT HV SMART SET (Cement) ×6 IMPLANT
COVER SURGICAL LIGHT HANDLE (MISCELLANEOUS) ×3 IMPLANT
COVER WAND RF STERILE (DRAPES) ×3 IMPLANT
CUFF TOURNIQUET SINGLE 34IN LL (TOURNIQUET CUFF) ×3 IMPLANT
CUFF TOURNIQUET SINGLE 44IN (TOURNIQUET CUFF) IMPLANT
DECANTER SPIKE VIAL GLASS SM (MISCELLANEOUS) ×3 IMPLANT
DRAPE EXTREMITY T 121X128X90 (DISPOSABLE) ×3 IMPLANT
DRAPE HALF SHEET 40X57 (DRAPES) ×6 IMPLANT
DRAPE U-SHAPE 47X51 STRL (DRAPES) ×3 IMPLANT
DRSG AQUACEL AG ADV 3.5X10 (GAUZE/BANDAGES/DRESSINGS) ×3 IMPLANT
DURAPREP 26ML APPLICATOR (WOUND CARE) ×3 IMPLANT
ELECT REM PT RETURN 9FT ADLT (ELECTROSURGICAL) ×3
ELECTRODE REM PT RTRN 9FT ADLT (ELECTROSURGICAL) ×1 IMPLANT
GLOVE BIO SURGEON STRL SZ8 (GLOVE) ×6 IMPLANT
GLOVE BIOGEL M 7.0 STRL (GLOVE) ×12 IMPLANT
GLOVE BIOGEL PI IND STRL 6.5 (GLOVE) ×2 IMPLANT
GLOVE BIOGEL PI IND STRL 7.0 (GLOVE) ×2 IMPLANT
GLOVE BIOGEL PI IND STRL 8 (GLOVE) ×2 IMPLANT
GLOVE BIOGEL PI INDICATOR 6.5 (GLOVE) ×4
GLOVE BIOGEL PI INDICATOR 7.0 (GLOVE) ×4
GLOVE BIOGEL PI INDICATOR 8 (GLOVE) ×4
GOWN STRL REUS W/ TWL LRG LVL3 (GOWN DISPOSABLE) ×2 IMPLANT
GOWN STRL REUS W/ TWL XL LVL3 (GOWN DISPOSABLE) ×2 IMPLANT
GOWN STRL REUS W/TWL LRG LVL3 (GOWN DISPOSABLE) ×4
GOWN STRL REUS W/TWL XL LVL3 (GOWN DISPOSABLE) ×4
HANDPIECE INTERPULSE COAX TIP (DISPOSABLE) ×2
HOOD PEEL AWAY FACE SHEILD DIS (HOOD) ×6 IMPLANT
KIT BASIN OR (CUSTOM PROCEDURE TRAY) ×3 IMPLANT
KIT TURNOVER KIT B (KITS) ×3 IMPLANT
MANIFOLD NEPTUNE II (INSTRUMENTS) ×3 IMPLANT
NEEDLE HYPO 21X1 ECLIPSE (NEEDLE) ×3 IMPLANT
NS IRRIG 1000ML POUR BTL (IV SOLUTION) ×3 IMPLANT
PACK TOTAL JOINT (CUSTOM PROCEDURE TRAY) ×3 IMPLANT
PAD ARMBOARD 7.5X6 YLW CONV (MISCELLANEOUS) ×6 IMPLANT
PIN STEINMAN FIXATION KNEE (PIN) ×3 IMPLANT
PIN THREADED HEADED SIGMA (PIN) ×3 IMPLANT
SET HNDPC FAN SPRY TIP SCT (DISPOSABLE) ×1 IMPLANT
SUT VIC AB 0 CT1 27 (SUTURE) ×2
SUT VIC AB 0 CT1 27XBRD ANBCTR (SUTURE) ×1 IMPLANT
SUT VIC AB 2-0 CT1 27 (SUTURE) ×2
SUT VIC AB 2-0 CT1 TAPERPNT 27 (SUTURE) ×1 IMPLANT
SUT VIC AB 3-0 FS2 27 (SUTURE) ×3 IMPLANT
SUT VLOC 180 0 24IN GS25 (SUTURE) ×3 IMPLANT
SYR 50ML LL SCALE MARK (SYRINGE) ×3 IMPLANT
TIBIAL BASE ROT PLAT SZ 8 KNEE (Knees) ×3 IMPLANT
TOWEL OR 17X24 6PK STRL BLUE (TOWEL DISPOSABLE) ×3 IMPLANT
TOWEL OR 17X26 10 PK STRL BLUE (TOWEL DISPOSABLE) ×3 IMPLANT
TRAY CATH 16FR W/PLASTIC CATH (SET/KITS/TRAYS/PACK) ×3 IMPLANT

## 2018-10-02 NOTE — Op Note (Signed)
PREOP DIAGNOSIS: DJD RIGHT KNEE POSTOP DIAGNOSIS: same PROCEDURE: RIGHT TKR ANESTHESIA: Spinal and MAC ATTENDING SURGEON: Hessie Dibble ASSISTANT: Loni Dolly PA  INDICATIONS FOR PROCEDURE: Peter Thompson is a 53 y.o. male who has struggled for a long time with pain due to degenerative arthritis of the right knee.  The patient has failed many conservative non-operative measures and at this point has pain which limits the ability to sleep and walk.  The patient is offered total knee replacement.  Informed operative consent was obtained after discussion of possible risks of anesthesia, infection, neurovascular injury, DVT, and death.  The importance of the post-operative rehabilitation protocol to optimize result was stressed extensively with the patient.  SUMMARY OF FINDINGS AND PROCEDURE:  Peter Thompson was taken to the operative suite where under the above anesthesia a right knee replacement was performed.  There were advanced degenerative changes and the bone quality was excellent.  We used the DePuy Attune system and placed size 7 femur, 8 tibia, 41 mm all polyethylene patella, and a size 7 mm spacer.  Loni Dolly PA-C assisted throughout and was invaluable to the completion of the case in that he helped retract and maintain exposure while I placed components.  He also helped close thereby minimizing OR time.  The patient was admitted for appropriate post-op care to include perioperative antibiotics and mechanical and pharmacologic measures for DVT prophylaxis.  DESCRIPTION OF PROCEDURE:  Peter Thompson was taken to the operative suite where the above anesthesia was applied.  The patient was positioned supine and prepped and draped in normal sterile fashion.  An appropriate time out was performed.  After the administration of kefzol pre-op antibiotic the leg was elevated and exsanguinated and a tourniquet inflated. A standard longitudinal incision was made on the anterior knee.   Dissection was carried down to the extensor mechanism.  All appropriate anti-infective measures were used including the pre-operative antibiotic, betadine impregnated drape, and closed hooded exhaust systems for each member of the surgical team.  A medial parapatellar incision was made in the extensor mechanism and the knee cap flipped and the knee flexed.  Some residual meniscal tissues were removed along with any remaining ACL/PCL tissue.  A guide was placed on the tibia and a flat cut was made on it's superior surface.  An intramedullary guide was placed in the femur and was utilized to make anterior and posterior cuts creating an appropriate flexion gap.  A second intramedullary guide was placed in the femur to make a distal cut properly balancing the knee with an extension gap equal to the flexion gap.  The three bones sized to the above mentioned sizes and the appropriate guides were placed and utilized.  A trial reduction was done and the knee easily came to full extension and the patella tracked well on flexion.  The trial components were removed and all bones were cleaned with pulsatile lavage and then dried thoroughly.  Cement was mixed and was pressurized onto the bones followed by placement of the aforementioned components.  Excess cement was trimmed and pressure was held on the components until the cement had hardened.  The tourniquet was deflated and a small amount of bleeding was controlled with cautery and pressure.  The knee was irrigated thoroughly.  The extensor mechanism was re-approximated with V-loc suture in running fashion.  The knee was flexed and the repair was solid.  The subcutaneous tissues were re-approximated with #0 and #2-0 vicryl and the skin closed with  a subcuticular stitch and steristrips.  A sterile dressing was applied.  Intraoperative fluids, EBL, and tourniquet time can be obtained from anesthesia records.  DISPOSITION:  The patient was taken to recovery room in stable  condition and admitted for appropriate post-op care to include peri-operative antibiotic and DVT prophylaxis with mechanical and pharmacologic measures.  Hessie Dibble 10/02/2018, 2:19 PM

## 2018-10-02 NOTE — Interval H&P Note (Signed)
History and Physical Interval Note:  10/02/2018 11:44 AM  Peter Thompson  has presented today for surgery, with the diagnosis of RIGHT KNEE DEGENERATIVE JOINT DISEASE  The various methods of treatment have been discussed with the patient and family. After consideration of risks, benefits and other options for treatment, the patient has consented to  Procedure(s): TOTAL KNEE ARTHROPLASTY (Right) as a surgical intervention .  The patient's history has been reviewed, patient examined, no change in status, stable for surgery.  I have reviewed the patient's chart and labs.  Questions were answered to the patient's satisfaction.     Velna Ochs

## 2018-10-02 NOTE — Anesthesia Postprocedure Evaluation (Signed)
Anesthesia Post Note  Patient: Peter Thompson  Procedure(s) Performed: TOTAL KNEE ARTHROPLASTY (Right )     Patient location during evaluation: PACU Anesthesia Type: MAC, Spinal and Regional Level of consciousness: oriented and awake and alert Pain management: pain level controlled Vital Signs Assessment: post-procedure vital signs reviewed and stable Respiratory status: spontaneous breathing and respiratory function stable Cardiovascular status: blood pressure returned to baseline and stable Postop Assessment: no headache, no backache, no apparent nausea or vomiting, spinal receding and patient able to bend at knees Anesthetic complications: no    Last Vitals:  Vitals:   10/02/18 1526 10/02/18 1541  BP: (!) 159/91 (!) 162/76  Pulse: (!) 50 (!) 52  Resp: 15 17  Temp:    SpO2: 97% 96%    Last Pain:  Vitals:   10/02/18 1550  TempSrc:   PainSc: Asleep                 ,W. EDMOND

## 2018-10-02 NOTE — Anesthesia Procedure Notes (Signed)
Anesthesia Regional Block: Adductor canal block   Pre-Anesthetic Checklist: ,, timeout performed, Correct Patient, Correct Site, Correct Laterality, Correct Procedure, Correct Position, site marked, Risks and benefits discussed, pre-op evaluation,  At surgeon's request and post-op pain management  Laterality: Right  Prep: Maximum Sterile Barrier Precautions used, chloraprep       Needles:  Injection technique: Single-shot  Needle Type: Echogenic Stimulator Needle     Needle Length: 9cm  Needle Gauge: 21     Additional Needles:   Procedures:,,,, ultrasound used (permanent image in chart),,,,  Narrative:  Start time: 10/02/2018 11:41 AM End time: 10/02/2018 11:51 AM Injection made incrementally with aspirations every 5 mL.  Performed by: Personally  Anesthesiologist: Gaynelle Adu, MD  Additional Notes: 2% Lidocaine skin wheel.

## 2018-10-02 NOTE — Transfer of Care (Signed)
Immediate Anesthesia Transfer of Care Note  Patient: Peter Thompson  Procedure(s) Performed: TOTAL KNEE ARTHROPLASTY (Right )  Patient Location: PACU  Anesthesia Type:Spinal and MAC combined with regional for post-op pain  Level of Consciousness: awake, alert  and oriented  Airway & Oxygen Therapy: Patient Spontanous Breathing  Post-op Assessment: Report given to RN, Post -op Vital signs reviewed and stable and Patient moving all extremities  Post vital signs: Reviewed and stable  Last Vitals:  Vitals Value Taken Time  BP 144/77 10/02/2018  2:56 PM  Temp 36.6 C 10/02/2018  2:55 PM  Pulse 58 10/02/2018  2:59 PM  Resp 15 10/02/2018  2:59 PM  SpO2 95 % 10/02/2018  2:59 PM  Vitals shown include unvalidated device data.  Last Pain:  Vitals:   10/02/18 1455  TempSrc:   PainSc: 0-No pain         Complications: No apparent anesthesia complications

## 2018-10-02 NOTE — Anesthesia Procedure Notes (Signed)
Spinal  Start time: 10/02/2018 12:52 PM End time: 10/02/2018 12:54 PM Staffing Anesthesiologist: Shelton Silvas, MD Performed: anesthesiologist  Preanesthetic Checklist Completed: patient identified, site marked, surgical consent, pre-op evaluation, timeout performed, IV checked, risks and benefits discussed and monitors and equipment checked Spinal Block Patient position: sitting Prep: site prepped and draped and DuraPrep Location: L3-4 Injection technique: single-shot Needle Needle type: Pencan  Needle gauge: 24 G Needle length: 10 cm Needle insertion depth: 10 cm Additional Notes Patient tolerated well. No immediate complications.

## 2018-10-03 ENCOUNTER — Other Ambulatory Visit: Payer: Self-pay

## 2018-10-03 ENCOUNTER — Observation Stay (HOSPITAL_COMMUNITY): Payer: Medicaid Other

## 2018-10-03 ENCOUNTER — Encounter (HOSPITAL_COMMUNITY): Payer: Self-pay | Admitting: Cardiology

## 2018-10-03 DIAGNOSIS — M1711 Unilateral primary osteoarthritis, right knee: Secondary | ICD-10-CM | POA: Diagnosis present

## 2018-10-03 DIAGNOSIS — G4700 Insomnia, unspecified: Secondary | ICD-10-CM | POA: Diagnosis present

## 2018-10-03 DIAGNOSIS — I4891 Unspecified atrial fibrillation: Secondary | ICD-10-CM

## 2018-10-03 DIAGNOSIS — F17210 Nicotine dependence, cigarettes, uncomplicated: Secondary | ICD-10-CM | POA: Diagnosis present

## 2018-10-03 DIAGNOSIS — Z6836 Body mass index (BMI) 36.0-36.9, adult: Secondary | ICD-10-CM | POA: Diagnosis not present

## 2018-10-03 DIAGNOSIS — I4819 Other persistent atrial fibrillation: Secondary | ICD-10-CM | POA: Diagnosis not present

## 2018-10-03 DIAGNOSIS — F17220 Nicotine dependence, chewing tobacco, uncomplicated: Secondary | ICD-10-CM | POA: Diagnosis present

## 2018-10-03 DIAGNOSIS — G8929 Other chronic pain: Secondary | ICD-10-CM | POA: Diagnosis present

## 2018-10-03 DIAGNOSIS — H9313 Tinnitus, bilateral: Secondary | ICD-10-CM | POA: Diagnosis present

## 2018-10-03 DIAGNOSIS — F419 Anxiety disorder, unspecified: Secondary | ICD-10-CM | POA: Diagnosis present

## 2018-10-03 DIAGNOSIS — Z96642 Presence of left artificial hip joint: Secondary | ICD-10-CM | POA: Diagnosis present

## 2018-10-03 DIAGNOSIS — I119 Hypertensive heart disease without heart failure: Secondary | ICD-10-CM | POA: Diagnosis present

## 2018-10-03 DIAGNOSIS — Z79899 Other long term (current) drug therapy: Secondary | ICD-10-CM | POA: Diagnosis not present

## 2018-10-03 DIAGNOSIS — E669 Obesity, unspecified: Secondary | ICD-10-CM | POA: Diagnosis present

## 2018-10-03 DIAGNOSIS — Z7982 Long term (current) use of aspirin: Secondary | ICD-10-CM | POA: Diagnosis not present

## 2018-10-03 LAB — CBC
HCT: 37.4 % — ABNORMAL LOW (ref 39.0–52.0)
HEMOGLOBIN: 12.7 g/dL — AB (ref 13.0–17.0)
MCH: 31.2 pg (ref 26.0–34.0)
MCHC: 34 g/dL (ref 30.0–36.0)
MCV: 91.9 fL (ref 80.0–100.0)
Platelets: 234 10*3/uL (ref 150–400)
RBC: 4.07 MIL/uL — ABNORMAL LOW (ref 4.22–5.81)
RDW: 12.2 % (ref 11.5–15.5)
WBC: 18.5 10*3/uL — ABNORMAL HIGH (ref 4.0–10.5)
nRBC: 0 % (ref 0.0–0.2)

## 2018-10-03 LAB — BASIC METABOLIC PANEL
Anion gap: 11 (ref 5–15)
BUN: 14 mg/dL (ref 6–20)
CO2: 25 mmol/L (ref 22–32)
Calcium: 8.8 mg/dL — ABNORMAL LOW (ref 8.9–10.3)
Chloride: 97 mmol/L — ABNORMAL LOW (ref 98–111)
Creatinine, Ser: 0.75 mg/dL (ref 0.61–1.24)
GFR calc non Af Amer: >60 mL/min (ref >60)
Glucose, Bld: 112 mg/dL — ABNORMAL HIGH (ref 70–99)
Potassium: 3.8 mmol/L (ref 3.5–5.1)
Sodium: 133 mmol/L — ABNORMAL LOW (ref 135–145)

## 2018-10-03 LAB — ECHOCARDIOGRAM COMPLETE
Height: 69 in
Weight: 4000 oz

## 2018-10-03 LAB — TSH: TSH: 0.495 u[IU]/mL (ref 0.350–4.500)

## 2018-10-03 LAB — MAGNESIUM: Magnesium: 2 mg/dL (ref 1.7–2.4)

## 2018-10-03 MED ORDER — RIVAROXABAN 20 MG PO TABS
20.0000 mg | ORAL_TABLET | Freq: Every day | ORAL | Status: DC
  Administered 2018-10-03: 20 mg via ORAL
  Filled 2018-10-03: qty 1

## 2018-10-03 MED ORDER — PERFLUTREN LIPID MICROSPHERE
1.0000 mL | INTRAVENOUS | Status: AC | PRN
  Administered 2018-10-03: 2 mL via INTRAVENOUS
  Filled 2018-10-03: qty 10

## 2018-10-03 MED ORDER — METOPROLOL TARTRATE 5 MG/5ML IV SOLN
5.0000 mg | Freq: Once | INTRAVENOUS | Status: AC | PRN
  Administered 2018-10-03: 5 mg via INTRAVENOUS

## 2018-10-03 MED ORDER — METOPROLOL TARTRATE 5 MG/5ML IV SOLN
5.0000 mg | INTRAVENOUS | Status: AC
  Administered 2018-10-03: 5 mg via INTRAVENOUS
  Filled 2018-10-03: qty 5

## 2018-10-03 MED ORDER — METOPROLOL TARTRATE 12.5 MG HALF TABLET
12.5000 mg | ORAL_TABLET | Freq: Once | ORAL | Status: AC | PRN
  Administered 2018-10-03: 12.5 mg via ORAL
  Filled 2018-10-03: qty 1

## 2018-10-03 NOTE — Consult Note (Addendum)
Cardiology Consultation:   Patient ID: Peter Thompson MRN: [id number]; DOB: May 06, 1966  Admit date: 10/02/2018 Date of Consult: 10/03/2018  Primary Care Provider: Quitman Livings, MD Primary Cardiologist: Gypsy Balsam, MD  Primary Electrophysiologist:  None    Patient Profile:   Peter Thompson is a 53 y.o. male veteran with multiple orthopedic problems, degenerative joint disease/osteoarthritis, multiple orthopedic surgeries, chronic pain, hypertension, obesity and tobacco abuse, admitted for elective right total knee replacement. His postoperative course has been complicated by new onset atrial fibrillation, for which cardiology has been consulted for at the request of Dr. Jerl Santos, orthopedic surgeon.   History of Present Illness:   Peter Thompson was evaluated by Dr. Gypsy Balsam in 2018, for preoperative evaluation prior to planned shoulder surgery.  At that time, the patient denied any chest discomfort but he was noted to have extremely high blood pressure despite being on multiple antihypertensives.  Subsequently Dr. Bing Matter recommended an echocardiogram, nuclear stress test to rule out ischemia as well as renal artery Dopplers to rule out renal artery stenosis.  Per care everywhere, it appears that studies were done however I am unable to review results of studies.  The patient recalls that he was told that everything was normal and he was cleared to have his surgery.  He had his shoulder surgery without any cardiac complications.  Patient was lost to follow-up and has not been seen since that time by cardiology.   He was admitted on October 02, 2018 for elective right total knee replacement.  Last night patient went into atrial fibrillation.  This is a new diagnosis.  He reports experiencing palpitations but no other symptoms.  He denies chest pain and dyspnea.  He remains in atrial fibrillation on telemetry with current ventricular responses ranging from the 90s to 110s.  His  blood pressure is stable.  CBC was the only lab drawn today.  Hemoglobin was 12.7.  This is down slightly from preop level which was 14.3.  No recent basic metabolic panel on file.   The patient denies any prior history of stroke or TIA.  He does self report a history of "brain aneurysm" in 2017 which was treated at [facility], however based on his description of what took place, I am doubtful of this diagnosis.  Based on the patient's description of the events that took place, it sounds more like he had severe epistaxis in the setting of severely elevated blood pressure.  He recalls having significant nosebleed that required nasal packing.  He does not recall having any CT scans or MRI of the head of brain at that time.  Patient also reports a long history of snoring and apneic spells. He often wakes up at night gasping for air.  His wife also confirms this however, he has never had a formal sleep study.  Prior to this hospitalization he reports that he was getting around fairly well without any cardiac symptoms.  Although limited by his orthopedic problems he was participating in water aerobics and swimming and had no limitations with this activity.   Past Medical History:  Diagnosis Date  . Anxiety   . Arthritis   . Bursitis   . Complication of anesthesia    "woke up in OR- IV came out."  . Concussion    several- head injury- was in milatary  . DJD (degenerative joint disease)   . Hypertension   . Insomnia   . Neck fracture (HCC)   . Tinnitus, bilateral  Past Surgical History:  Procedure Laterality Date  . ANKLE SURGERY Left   . Arm surgery Right    fracture repair  . CERVICAL SPINE SURGERY     bone graft from left hip  . HAND SURGERY Left    drains for Infection  . HIP SURGERY Left    "scrapped" x2  . KNEE ARTHROSCOPY Right   . LEG SURGERY Left    "1 inch took out"  . SHOULDER ARTHROSCOPY Left 12/27/2016   Procedure: ARTHROSCOPY SHOULDER;  Surgeon: Marcene CorningPeter Dalldorf,  MD;  [facility];  Service: Orthopedics;  Laterality: Left;  Debridement, AC Decompression, Acromioplasty   . TOTAL HIP ARTHROPLASTY Left 05/10/2016   Procedure: TOTAL HIP ARTHROPLASTY ANTERIOR APPROACH;  Surgeon: Marcene CorningPeter Dalldorf, MD;  Location: [facility] OR;  Service: Orthopedics;  Laterality: Left;  . WRIST SURGERY Right    2 Pinns     Home Medications:  Prior to Admission medications   Medication Sig Start Date End Date Taking? Authorizing Provider  amLODipine (NORVASC) 10 MG tablet Take 10 mg by mouth daily.   Yes [provider]  aspirin EC 325 MG EC tablet Take 1 tablet (325 mg total) by mouth 2 (two) times daily after a meal. Patient taking differently: Take 325 mg by mouth daily.  05/12/16  Yes Elodia FlorenceNida, Andrew, PA-C  cloNIDine (CATAPRES) 0.2 MG tablet Take 0.2 mg by mouth 3 (three) times daily.   Yes [provider]  hydrALAZINE (APRESOLINE) 100 MG tablet Take 100 mg by mouth 3 (three) times daily.   Yes [provider]  labetalol (NORMODYNE) 300 MG tablet Take 300 mg by mouth 2 (two) times daily.    Yes [provider]  lisinopril-hydrochlorothiazide (PRINZIDE,ZESTORETIC) 20-25 MG tablet Take 1 tablet by mouth daily.   Yes [provider]  Multiple Vitamins-Minerals (MULTIVITAMIN WITH MINERALS) tablet Take 1 tablet by mouth daily.   Yes [provider]  Oxycodone HCl 10 MG TABS Take 1-2 tablets (10-20 mg total) by mouth every 4 (four) hours as needed (severe pain). Patient taking differently: Take 20 mg by mouth every 4 (four) hours as needed (severe pain).  12/27/16  Yes Elodia FlorenceNida, Andrew, PA-C  potassium chloride (K-DUR) 10 MEQ tablet Take 10 mEq by mouth daily.   Yes [provider]  oxyCODONE (ROXICODONE) 15 MG immediate release tablet Take 1 tablet (15 mg total) by mouth every 4 (four) hours as needed for pain. Patient not taking: Reported on 09/25/2018 05/12/16   Elodia FlorenceNida, Andrew, PA-C  triamcinolone cream (KENALOG) 0.1 % Apply 1  application topically 2 (two) times daily.    [provider]    Inpatient Medications: Scheduled Meds: . acetaminophen  1,000 mg Oral Q6H  . amLODipine  10 mg Oral Daily  . aspirin EC  325 mg Oral BID PC  . cloNIDine  0.2 mg Oral TID  . docusate sodium  100 mg Oral BID  . hydrALAZINE  100 mg Oral TID  . lisinopril  20 mg Oral Daily   And  . hydrochlorothiazide  25 mg Oral Daily  . labetalol  300 mg Oral BID  . potassium chloride  10 mEq Oral Daily   Continuous Infusions: . lactated ringers 50 mL/hr at 10/02/18 2112  . methocarbamol (ROBAXIN) IV     PRN Meds: acetaminophen, alum & mag hydroxide-simeth, bisacodyl, diphenhydrAMINE, HYDROmorphone (DILAUDID) injection, menthol-cetylpyridinium **OR** phenol, methocarbamol **OR** methocarbamol (ROBAXIN) IV, metoCLOPramide **OR** metoCLOPramide (REGLAN) injection, ondansetron **OR** ondansetron (ZOFRAN) IV, oxyCODONE  Allergies:   No Known  Allergies  Social History:   Social History   Socioeconomic History  . Marital status: Legally Separated    Spouse name: Not on file  . Number of children: Not on file  . Years of education: Not on file  . Highest education level: Not on file  Occupational History  . Not on file  Social Needs  . Financial resource strain: Not on file  . Food insecurity:    Worry: Not on file    Inability: Not on file  . Transportation needs:    Medical: Not on file    Non-medical: Not on file  Tobacco Use  . Smoking status: Current Every Day Smoker    Packs/day: 0.50    Years: 56.00    Pack years: 28.00    Types: Cigarettes  . Smokeless tobacco: Current User    Types: Chew  . Tobacco comment: occasional - chew  Substance and Sexual Activity  . Alcohol use: Yes    Alcohol/week: 12.0 standard drinks    Types: 12 Cans of beer per week  . Drug use: No  . Sexual activity: Not on file  Lifestyle  . Physical activity:    Days per week: Not on file    Minutes per session: Not on file  .  Stress: Not on file  Relationships  . Social connections:    Talks on phone: Not on file    Gets together: Not on file    Attends religious service: Not on file    Active member of club or organization: Not on file    Attends meetings of clubs or organizations: Not on file    Relationship status: Not on file  . Intimate partner violence:    Fear of current or ex partner: Not on file    Emotionally abused: Not on file    Physically abused: Not on file    Forced sexual activity: Not on file  Other Topics Concern  . Not on file  Social History Narrative  . Not on file    Family History:    Family History  Problem Relation Age of Onset  . Heart disease Neg Hx      ROS:  Please see the history of present illness.   All other ROS reviewed and negative.     Physical Exam/Data:   Vitals:   10/03/18 0225 10/03/18 0244 10/03/18 0322 10/03/18 0946  BP: (!) 131/91 132/82 (!) 142/104 (!) 141/91  Pulse: (!) 112 (!) 114 82 92  Resp:   20   Temp:  98.3 F (36.8 C) 98 F (36.7 C)   TempSrc:  Oral Oral   SpO2: 94% 97% 96% 98%  Weight:      Height:        Intake/Output Summary (Last 24 hours) at 10/03/2018 1409 Last data filed at 10/03/2018 0902 Gross per 24 hour  Intake 3093.47 ml  Output 1800 ml  Net 1293.47 ml   Last 3 Weights 10/02/2018 09/25/2018 12/22/2016  Weight (lbs) 250 lb 254 lb 6.4 oz 257 lb  Weight (kg) 113.399 kg 115.395 kg 116.574 kg     Body mass index is 36.92 kg/m.  General:  Moderately obese, middle aged WM, mullet style haircut Well nourished, well developed, in no acute distress,   HEENT: normal Lymph: no adenopathy Neck: no JVD Endocrine:  No thryomegaly Vascular: No carotid bruits; FA pulses 2+ bilaterally without bruits  Cardiac:  irregularly irregular rhythm, mildly tachy rate no murmur  Lungs:  clear  to auscultation bilaterally, no wheezing, rhonchi or rales  Abd: obese abdomen, soft, nontender, no hepatomegaly  Ext: s/p right TKA (bandaged). No  edema  Musculoskeletal:  No deformities, BUE and BLE strength normal and equal Skin: warm and dry  Neuro:  CNs 2-12 intact, no focal abnormalities noted Psych:  Normal affect   EKG:  The EKG was personally reviewed and demonstrates:  Atrial fibrillation  Telemetry:  Telemetry was personally reviewed and demonstrates:  Atrial fibrillation, V-rates in the 90-110s  Relevant CV Studies: None on file.   Laboratory Data:  ChemistryNo results for input(s): NA, K, CL, CO2, GLUCOSE, BUN, CREATININE, CALCIUM, GFRNONAA, GFRAA, ANIONGAP in the last 168 hours.  No results for input(s): PROT, ALBUMIN, AST, ALT, ALKPHOS, BILITOT in the last 168 hours. Hematology Recent Labs  Lab 10/03/18 0729  WBC 18.5*  RBC 4.07*  HGB 12.7*  HCT 37.4*  MCV 91.9  MCH 31.2  MCHC 34.0  RDW 12.2  PLT 234   Cardiac EnzymesNo results for input(s): TROPONINI in the last 168 hours. No results for input(s): TROPIPOC in the last 168 hours.  BNPNo results for input(s): BNP, PROBNP in the last 168 hours.  DDimer No results for input(s): DDIMER in the last 168 hours.  Radiology/Studies:  No results found.  Assessment and Plan:   Peter Thompson is a 53 y.o. male veteran with multiple orthopedic problems, degenerative joint disease/osteoarthritis, multiple orthopedic surgeries, chronic pain, hypertension, obesity and tobacco abuse, admitted for elective right total knee replacement. His postoperative course has been complicated by new onset atrial fibrillation, for which cardiology has been consulted for at the request of Dr. Jerl Santos, orthopedic surgeon.  1. Right TKA: POD#1. Management per ortho.   2. Atrial Fibrillation: new diagnosis. In the setting of recent orthopedic surgery. Current rates in the 90s-110s. Fairly asymptomatic other than some occasional sensation of palpitations. Volume status appears stable. Does not appear to be in acute CHF. BP is stable and his current HR is decent. Will continue current  dose of labetalol. We will also check a 2D Echo to assess LVF, wall motion, valve anatomy and atrial chamber size. Will also check TSH, BMP and Mg today. His CHA2DS2 VASc score is only 1 for HTN. This patients CHA2DS2-VASc Score and unadjusted Ischemic Stroke Rate (% per year) is equal to 0.6 % stroke rate/year from a score of 1. His yearly risk for CVA is low, thus may not need long term anticoagulation, however we should consider starting oral anticoagulation, at least short term, in case he may require electrical cardioversion to restore NSR. If ok to begin per ortho, would recommend Xarelto. After 3 weeks of anticoagulation, we can arrange outpatient DCCV if still in atrial fibrillation. Also given his h/o snoring, apneic spells and body habitus, would recommend outpatient sleep study to r/o OSA.   Above score calculated as 1 point each if present [CHF, HTN, DM, Vascular=MI/PAD/Aortic Plaque, Age if 65-74, or Male] Above score calculated as 2 points each if present [Age > 75, or Stroke/TIA/TE]   3. HTN: Controlled on current meds. Continue and monitor.    For questions or updates, please contact [facility] Please consult [email or web address] for contact info under     Signed, Robbie Lis, PA-C  10/03/2018 2:09 PM   Patient seen and examined and history reviewed. Agree with above findings and plan.53 yo WM with history of severe HTN presented for TKR. Post op he developed Afib. Rate initially fast but after getting his medications HR  controlled in 90s. He is asymptomatic. No prior history of AFib. Had cardiac work up with Echo, Myoview and renal dopplers in May 2018 which were unremarkable. No associated with chest pain, SOB or palpitations. On exam he is overweight. No JVD Lungs are clear.  IRRR without gallop or murmur Right leg with surgical dressing.  Ecg shows Afib with rate 112. Otherwise normal.  Impression: 1. New onset Afib post op knee surgery. Rate control adequate on  labetalol. He is asymptomatic. We will check chemistries and TSH. Update Echo. Plan anticoagulation with Xarelto. Hopefully patient will convert to NSR on his own but if Afib persists could consider DCCV after 4 weeks of anticoagulation. Will arrange follow up with Dr Bing Matter as outpatient.   [place], MDFACC 10/03/2018 3:01 PM

## 2018-10-03 NOTE — Progress Notes (Signed)
PT Cancellation Note  Patient Details Name: Peter Thompson MRN: [id number] DOB: March 23, 1966   Cancelled Treatment:    Reason Eval/Treat Not Completed: Other (comment).  Declined PT again, talking about a "sonogram" for his heart, and is waiting for wife to assist his bath.  He is confident per his report he can get right up and walk, but PT asked him to try with her due to MD needing to see him move.  Pt asks PT to return at another time.  Nursing was notified of all the conversation.   Ivar Drape 10/03/2018, 2:22 PM   Peter Thompson, PT MS Acute Rehab Dept. Number: [facility] [id number] and [facility] [phone]

## 2018-10-03 NOTE — Discharge Instructions (Signed)

## 2018-10-03 NOTE — Progress Notes (Signed)
  Echocardiogram 2D Echocardiogram has been performed.  Leta Jungling M 10/03/2018, 3:41 PM

## 2018-10-03 NOTE — Progress Notes (Signed)
PT Cancellation Note  Patient Details Name: DEQUANTE HIDAY MRN: [id number] DOB: 1966/05/16   Cancelled Treatment:    Reason Eval/Treat Not Completed: Other (comment);Medical issues which prohibited therapy.   Pt is declining to get up until cardiologist sees him as he had a rapid response called overnight for his HR.  Has values at rest of up to 122, noted his lowest values at 102.  Will retry in the PM.   Ivar Drape 10/03/2018, 11:55 AM   Samul Dada, PT MS Acute Rehab Dept. Number: [facility] [id number] and [facility] [phone]

## 2018-10-03 NOTE — Plan of Care (Signed)

## 2018-10-03 NOTE — Progress Notes (Signed)
ANTICOAGULATION CONSULT NOTE - Initial Consult  Pharmacy Consult for Xarelto Indication: atrial fibrillation  No Known Allergies  Patient Measurements: Height: 5\' 9"  (175.3 cm) Weight: 250 lb (113.4 kg) IBW/kg (Calculated) : 70.7  Vital Signs: Temp: 98.1 F (36.7 C) (01/22 1432) Temp Source: Oral (01/22 1432) BP: 101/83 (01/22 1432) Pulse Rate: 93 (01/22 1432)  Labs: Recent Labs    10/03/18 0729  HGB 12.7*  HCT 37.4*  PLT 234    Estimated Creatinine Clearance: 134.1 mL/min (by C-G formula based on SCr of 0.68 mg/dL).   Medical History: Past Medical History:  Diagnosis Date  . Anxiety   . Arthritis   . Bursitis   . Complication of anesthesia    "woke up in OR- IV came out."  . Concussion    several- head injury- was in milatary  . DJD (degenerative joint disease)   . Hypertension   . Insomnia   . Neck fracture (HCC)   . Tinnitus, bilateral     Assessment: 4 YOM admitted for knee replacement now post-op with new onset Afib, pharmacy consulted to start Xarelto.  No anticoagulation PTA, H/H low but stable s/p surgery, plts 234.  SCr 0.68, CrCl (TBW) > 54mL/min.  Goal of Therapy:  Monitor platelets by anticoagulation protocol: Yes   Plan:  Xarelto 20mg  PO once daily Monitor renal function, s/s bleeding  Daylene Posey, PharmD Clinical Pharmacist Please check AMION for all [facility] numbers 10/03/2018 2:53 PM

## 2018-10-03 NOTE — Progress Notes (Signed)
2230 Pt's HR is in 100-130. HR is irregular. Pt is asymptomatic; however states that his pain is not well controlled despite all given pain meds. Scheduled labetalol is given at 2235.  0000 Pt's HR is fluctuating into the 100-150. Pt is asymptomatic.RRT is notified. EKG is done and shows A-fib. Pt is placed on Telemonitoring.  0100 RN is trying to reach MD; called 3 times. Waiting for the call back.    0225 Metoprolol 5 mg IV is given x2 per MD order.  HR in 112-116. No significant improvement. BP 132/82    0245  Metoprolol PO 12.5 is given.   0400 Pt's HR is 100-130. Pt is asymptomatic.Resting comfortably. Per RRT keep pt in bed till Ortho MD sees the pt. Nursing will continue to monitor.

## 2018-10-03 NOTE — Significant Event (Addendum)
Rapid Response Event Note  Overview: Cardiac - Arrhythmia - AF  Initial Focused Assessment: Called by RN about patient's HR fluctuating, per RN, HR was 50s (SB) on arrival from PACU. EKG from 1/14 also showed SB. Per RN, HR has been fluctuating into the 100-150s range now, and is irregular. BP are stable SBP 130-150s. Per RN, not in acute distress. I asked RN to obtain EKG and VS. Upon arrival, patient was awake and oriented, stated he was uncomfortable. Skin warm and dry, denies shortness of breath and denies chest pain. EKG reviewed - AF on EKG. HR was in the 100-120 on continuous pulse oximetry. RN administered scheduled antihypertensives and has been administering pain medications as needed. Oxygen saturations 100% on RA. Patient appears somewhat anxious as well.   Interventions:  -- STAT EKG - AF -- appears new to be new. Patient denies any heart rhythm problems and previous EKG was SB  -- RN to administer scheduled Toradol.  -- Placed on continuous telemetry - AF ( HR 100 - 130).   -- RN paged ORTHO on call, waiting for callback.  Plan of Care:  -- Dannielle Burn PA called back at 0114, updated on patient. Ortho PA will call CARDS MD for assistance. I called the bedside RN as well and informed her.   -- Dannielle Burn PA called back at 0132, per CARDS will give Lopressor 5 mg IV x 1, wait 5 minutes, if no improvement in HR in 5 minutes, will give 2nd dose Lopressor 5 mg IV x 1, wait 5 minutes, if no improvement in HR then will give Lopressor 12.5 mg PO. I was there for IV administration of Lopressor, HR did improve slightly now low 100-115 range but still would burst in mid 120s. I asked the RN to give the PO. BP were being monitored as well. Patient remains asymptomatic.  -- I received a call at 0408, per RN HR still in 120-130 at times. I paged CARDS MD on call, we discussed the patient, plan for now, monitor patient, keep patient in bed til ORTHO MD sees patient in morning, and if patient is  still in AF, formal CARDS consult is recommended. Patient is still asymptomatic and BP are stable.  Event Summary:    at    Call Time 0007 Arrival Time 0020 End Time 0415  ,  R

## 2018-10-03 NOTE — Progress Notes (Signed)
Subjective: 1 Day Post-Op Procedure(s) (LRB): TOTAL KNEE ARTHROPLASTY (Right)   Patient resting comfortably in bed. He is on his baseline pain meds plus IV dilaudid Q3hrs PRN pain. Last night he started in A-Fib. HE has never had this in the past. RRT was called and cardiology was contacted. They tried both IV and oral metoprolol but he continues in A-fib with an elevated HR so we have re consulted cardiology this morning. Patient is asking for valium or xanax this morning to help him rest.  Activity level:  wbat Diet tolerance:  ok Voiding:  ok Patient reports pain as mild and moderate.    Objective: Vital signs in last 24 hours: Temp:  [97.7 F (36.5 C)-98.5 F (36.9 C)] 98 F (36.7 C) (01/22 0322) Pulse Rate:  [50-130] 82 (01/22 0322) Resp:  [12-22] 20 (01/22 0322) BP: (104-162)/(52-104) 142/104 (01/22 0322) SpO2:  [54 %-99 %] 96 % (01/22 0322) Weight:  [113.4 kg] 113.4 kg (01/21 1104)  Labs: No results for input(s): HGB in the last 72 hours. No results for input(s): WBC, RBC, HCT, PLT in the last 72 hours. No results for input(s): NA, K, CL, CO2, BUN, CREATININE, GLUCOSE, CALCIUM in the last 72 hours. No results for input(s): LABPT, INR in the last 72 hours.  Physical Exam:  Neurologically intact ABD soft Neurovascular intact Sensation intact distally Intact pulses distally Dorsiflexion/Plantar flexion intact Incision: dressing C/D/I and no drainage No cellulitis present Compartment soft  Assessment/Plan:  1 Day Post-Op Procedure(s) (LRB): TOTAL KNEE ARTHROPLASTY (Right) Advance diet Up with therapy  We have spoken to cardiology who will see him today. They stated it is safe for him to get up and walk with PT. We will keep him on Aspirin for DVT prevention until cardiology sees him to see if they want to put him on a stronger anticoagulation medication.  I informed him that we will not be prescribing any valium or xanax. We have him on tizanidine and that will be  the only muscle relaxer that we prescribe. He is understanding of this as he has a long history of narcotics. We will continue to follow him closely.  Ginger Organ  10/03/2018, 7:42 AM

## 2018-10-03 NOTE — Progress Notes (Signed)
PT Cancellation Note  Patient Details Name: Peter Thompson MRN: [id number] DOB: 1966/01/29   Cancelled Treatment:    Reason Eval/Treat Not Completed: Other (comment).  Pt was getting his hair washed with wife's assistance and stated he still needed to get pain meds to work later.  Will try again tomorrow.   Ivar DrapeRuth E  10/03/2018, 3:58 PM   Samul Dadauth , PT MS Acute Rehab Dept. Number: [facility] [phone] and [facility] [phone]

## 2018-10-03 NOTE — Progress Notes (Signed)
Pt was ambulating to the hall this afternoon with a walker. Right knee dressing is saturated with blood. Elodia Florence PA notified. Dressing removed, mod amount of blood noted but no more active bleeding noted. New Aquacel dressing applied and replaced ace wrap. Ice pack applied.

## 2018-10-04 MED ORDER — OXYCODONE HCL 20 MG PO TABS: 20.0000 mg | ORAL_TABLET | ORAL | 0 refills | Status: AC | PRN

## 2018-10-04 MED ORDER — LABETALOL HCL 300 MG PO TABS: 300.0000 mg | ORAL_TABLET | Freq: Two times a day (BID) | ORAL | 0 refills | Status: AC

## 2018-10-04 MED ORDER — RIVAROXABAN 20 MG PO TABS: 20.0000 mg | ORAL_TABLET | Freq: Every day | ORAL | 0 refills | Status: DC

## 2018-10-04 MED ORDER — METHOCARBAMOL 750 MG PO TABS: 750.0000 mg | ORAL_TABLET | Freq: Four times a day (QID) | ORAL | 1 refills | Status: DC | PRN

## 2018-10-04 NOTE — Progress Notes (Signed)
Subjective: 2 Days Post-Op Procedure(s) (LRB): TOTAL KNEE ARTHROPLASTY (Right)   Patient is in pain but is wanting to go home on his baseline pain meds along with the muscle relaxer we have started here in the hospital. Cardiology follow up appointment has been set up.  Activity level:  wbat Diet tolerance:  ok Voiding:  ok Patient reports pain as mild.    Objective: Vital signs in last 24 hours: Temp:  [97.8 F (36.6 C)-98.1 F (36.7 C)] 98 F (36.7 C) (01/23 0351) Pulse Rate:  [68-101] 79 (01/23 0936) Resp:  [18] 18 (01/23 0351) BP: (101-154)/(64-94) 154/94 (01/23 0936) SpO2:  [98 %-99 %] 99 % (01/23 0351)  Labs: Recent Labs    10/03/18 0729  HGB 12.7*   Recent Labs    10/03/18 0729  WBC 18.5*  RBC 4.07*  HCT 37.4*  PLT 234   Recent Labs    10/03/18 1636  NA 133*  K 3.8  CL 97*  CO2 25  BUN 14  CREATININE 0.75  GLUCOSE 112*  CALCIUM 8.8*   No results for input(s): LABPT, INR in the last 72 hours.  Physical Exam:  Neurologically intact ABD soft Neurovascular intact Sensation intact distally Intact pulses distally Dorsiflexion/Plantar flexion intact Incision: dressing C/D/I and no drainage No cellulitis present Compartment soft  Assessment/Plan:  2 Days Post-Op Procedure(s) (LRB): TOTAL KNEE ARTHROPLASTY (Right) Advance diet Discharge home with home health today. He states that he doesn't think he needs home health PT and that he will just do it on his own. I will send in his baseline pain meds for him to go home on as he states he is almost out. He will also go home on xarelto and labetalol per cardiology he also has a follow up scheduled with them. Follow up in office 2 weeks post op.  Anticipated LOS equal to or greater than 2 midnights due to - Age 53 and older with one or more of the following:  - Obesity  - Expected need for hospital services (PT, OT, Nursing) required for safe  discharge  - Anticipated need for postoperative skilled  nursing care or inpatient rehab  - Active co-morbidities: Chronic pain requiring opiods and Cardiac Arrhythmia OR   - Unanticipated findings during/Post Surgery: Slow post-op progression: GI, pain control, mobility  - Patient is a high risk of re-admission due to: None  Ginger Organ  10/04/2018, 12:49 PM

## 2018-10-04 NOTE — Plan of Care (Signed)
POC reviewed with pt. and pain management.

## 2018-10-04 NOTE — Plan of Care (Signed)
  Problem: Education: Goal: Knowledge of General Education information will improve Description: Including pain rating scale, medication(s)/side effects and non-pharmacologic comfort measures Outcome: Progressing   Problem: Health Behavior/Discharge Planning: Goal: Ability to manage health-related needs will improve Outcome: Progressing   Problem: Clinical Measurements: Goal: Respiratory complications will improve Outcome: Progressing Goal: Cardiovascular complication will be avoided Outcome: Progressing   Problem: Activity: Goal: Risk for activity intolerance will decrease Outcome: Progressing   Problem: Nutrition: Goal: Adequate nutrition will be maintained Outcome: Progressing   Problem: Coping: Goal: Level of anxiety will decrease Outcome: Progressing   Problem: Elimination: Goal: Will not experience complications related to urinary retention Outcome: Progressing   Problem: Pain Managment: Goal: General experience of comfort will improve Outcome: Progressing   Problem: Safety: Goal: Ability to remain free from injury will improve Outcome: Progressing   Problem: Skin Integrity: Goal: Risk for impaired skin integrity will decrease Outcome: Progressing   

## 2018-10-04 NOTE — Discharge Summary (Signed)
Patient ID: Peter Thompson Bonds MRN: [id number] DOB/AGE: 53-20-1967 53 y.o.  Admit date: 10/02/2018 Discharge date: 10/04/2018  Admission Diagnoses:  Principal Problem:   Primary osteoarthritis of right knee Active Problems:   Persistent atrial fibrillation   Discharge Diagnoses:  Same  Past Medical History:  Diagnosis Date  . Anxiety   . Arthritis   . Bursitis   . Complication of anesthesia    "woke up in OR- IV came out."  . Concussion    several- head injury- was in milatary  . DJD (degenerative joint disease)   . Hypertension   . Insomnia   . Neck fracture (HCC)   . Tinnitus, bilateral     Surgeries: Procedure(s): TOTAL KNEE ARTHROPLASTY on 10/02/2018   Consultants:   Discharged Condition: Improved  Hospital Course: Peter Thompson Zapata is an 53 y.o. male who was admitted 10/02/2018 for operative treatment ofPrimary osteoarthritis of right knee. Patient has severe unremitting pain that affects sleep, daily activities, and work/hobbies. After pre-op clearance the patient was taken to the operating room on 10/02/2018 and underwent  Procedure(s): TOTAL KNEE ARTHROPLASTY.    Patient was given perioperative antibiotics:  Anti-infectives (From admission, onward)   Start     Dose/Rate Route Frequency Ordered Stop   10/02/18 1930  ceFAZolin (ANCEF) IVPB 2g/100 mL premix     2 g 200 mL/hr over 30 Minutes Intravenous Every 6 hours 10/02/18 1849 10/03/18 0214   10/02/18 1115  ceFAZolin (ANCEF) IVPB 2g/100 mL premix     2 g 200 mL/hr over 30 Minutes Intravenous On call to O.R. 10/02/18 1105 10/02/18 1300       Patient was given sequential compression devices, early ambulation, and chemoprophylaxis to prevent DVT.  Patient went into A-Fib the night after surgery. Cardiology was consulted and he was started on Xarelto and labetalol. He will follow up with them in 5 days on an outpatient basis.   Patient benefited maximally from hospital stay and there were no complications.     Recent vital signs:  Patient Vitals for the past 24 hrs:  BP Temp Temp src Pulse Resp SpO2  10/04/18 0936 (!) 154/94 - - 79 - -  10/04/18 0351 127/82 98 F (36.7 Thompson) Oral 68 18 99 %  10/03/18 2205 139/64 - - 81 - -  10/03/18 2004 117/72 97.8 F (36.6 Thompson) Oral 70 18 98 %  10/03/18 1615 115/71 - - (!) 101 - -  10/03/18 1432 101/83 98.1 F (36.7 Thompson) Oral 93 18 98 %     Recent laboratory studies:  Recent Labs    10/03/18 0729 10/03/18 1636  WBC 18.5*  --   HGB 12.7*  --   HCT 37.4*  --   PLT 234  --   NA  --  133*  K  --  3.8  CL  --  97*  CO2  --  25  BUN  --  14  CREATININE  --  0.75  GLUCOSE  --  112*  CALCIUM  --  8.8*     Discharge Medications:   Allergies as of 10/04/2018   No Known Allergies     Medication List    STOP taking these medications   aspirin 325 MG EC tablet     TAKE these medications   amLODipine 10 MG tablet Commonly known as:  NORVASC Take 10 mg by mouth daily.   cloNIDine 0.2 MG tablet Commonly known as:  CATAPRES Take 0.2 mg by mouth 3 (three) times  daily.   hydrALAZINE 100 MG tablet Commonly known as:  APRESOLINE Take 100 mg by mouth 3 (three) times daily.   labetalol 300 MG tablet Commonly known as:  NORMODYNE Take 1 tablet (300 mg total) by mouth 2 (two) times daily.   lisinopril-hydrochlorothiazide 20-25 MG tablet Commonly known as:  PRINZIDE,ZESTORETIC Take 1 tablet by mouth daily.   methocarbamol 750 MG tablet Commonly known as:  ROBAXIN Take 1 tablet (750 mg total) by mouth every 6 (six) hours as needed for muscle spasms.   multivitamin with minerals tablet Take 1 tablet by mouth daily.   Oxycodone HCl 20 MG Tabs Take 1 tablet (20 mg total) by mouth every 4 (four) hours as needed (severe pain). What changed:    medication strength  how much to take  Another medication with the same name was removed. Continue taking this medication, and follow the directions you see here.   potassium chloride 10 MEQ  tablet Commonly known as:  K-DUR Take 10 mEq by mouth daily.   rivaroxaban 20 MG Tabs tablet Commonly known as:  XARELTO Take 1 tablet (20 mg total) by mouth daily with supper.   triamcinolone cream 0.1 % Commonly known as:  KENALOG Apply 1 application topically 2 (two) times daily.            Durable Medical Equipment  (From admission, onward)         Start     Ordered   10/02/18 2029  DME Walker rolling  Once    Question:  Patient needs a walker to treat with the following condition  Answer:  Primary osteoarthritis of right knee   10/02/18 2028   10/02/18 2029  DME 3 n 1  Once     10/02/18 2028   10/02/18 2029  DME Bedside commode  Once    Question:  Patient needs a bedside commode to treat with the following condition  Answer:  Primary osteoarthritis of right knee   10/02/18 2028          Diagnostic Studies: Dg Chest 2 View  Result Date: 09/25/2018 CLINICAL DATA:  Pre knee replacement evaluation. EXAM: CHEST - 2 VIEW COMPARISON:  09/30/2016. FINDINGS: Stable enlarged cardiac silhouette. Clear lungs with normal vascularity. Cervical spine fixation wire. IMPRESSION: No acute abnormality. Stable cardiomegaly. Electronically Signed   By: Beckie SaltsSteven  Reid M.D.   On: 09/25/2018 16:31    Disposition: Discharge disposition: 01-Home or Self Care       Discharge Instructions    Call MD / Call 911   Complete by:  As directed    If you experience chest pain or shortness of breath, CALL 911 and be transported to the hospital emergency room.  If you develope a fever above 101 F, pus (white drainage) or increased drainage or redness at the wound, or calf pain, call your surgeon's office.   Constipation Prevention   Complete by:  As directed    Drink plenty of fluids.  Prune juice may be helpful.  You may use a stool softener, such as Colace (over the counter) 100 mg twice a day.  Use MiraLax (over the counter) for constipation as needed.   Diet - low sodium heart healthy    Complete by:  As directed    Discharge instructions   Complete by:  As directed    INSTRUCTIONS AFTER JOINT REPLACEMENT   Remove items at home which could result in a fall. This includes throw rugs or furniture in walking pathways ICE  to the affected joint every three hours while awake for 30 minutes at a time, for at least the first 3-5 days, and then as needed for pain and swelling.  Continue to use ice for pain and swelling. You may notice swelling that will progress down to the foot and ankle.  This is normal after surgery.  Elevate your leg when you are not up walking on it.   Continue to use the breathing machine you got in the hospital (incentive spirometer) which will help keep your temperature down.  It is common for your temperature to cycle up and down following surgery, especially at night when you are not up moving around and exerting yourself.  The breathing machine keeps your lungs expanded and your temperature down.   DIET:  As you were doing prior to hospitalization, we recommend a well-balanced diet.  DRESSING / WOUND CARE / SHOWERING  You may shower 3 days after surgery, but keep the wounds dry during showering.  You may use an occlusive plastic wrap (Press'n Seal for example), NO SOAKING/SUBMERGING IN THE BATHTUB.  If the bandage gets wet, change with a clean dry gauze.  If the incision gets wet, pat the wound dry with a clean towel.  ACTIVITY  Increase activity slowly as tolerated, but follow the weight bearing instructions below.   No driving for 6 weeks or until further direction given by your physician.  You cannot drive while taking narcotics.  No lifting or carrying greater than 10 lbs. until further directed by your surgeon. Avoid periods of inactivity such as sitting longer than an hour when not asleep. This helps prevent blood clots.  You may return to work once you are authorized by your doctor.     WEIGHT BEARING   Weight bearing as tolerated with assist  device (walker, cane, etc) as directed, use it as long as suggested by your surgeon or therapist, typically at least 4-6 weeks.   EXERCISES  Results after joint replacement surgery are often greatly improved when you follow the exercise, range of motion and muscle strengthening exercises prescribed by your doctor. Safety measures are also important to protect the joint from further injury. Any time any of these exercises cause you to have increased pain or swelling, decrease what you are doing until you are comfortable again and then slowly increase them. If you have problems or questions, call your caregiver or physical therapist for advice.   Rehabilitation is important following a joint replacement. After just a few days of immobilization, the muscles of the leg can become weakened and shrink (atrophy).  These exercises are designed to build up the tone and strength of the thigh and leg muscles and to improve motion. Often times heat used for twenty to thirty minutes before working out will loosen up your tissues and help with improving the range of motion but do not use heat for the first two weeks following surgery (sometimes heat can increase post-operative swelling).   These exercises can be done on a training (exercise) mat, on the floor, on a table or on a bed. Use whatever works the best and is most comfortable for you.    Use music or television while you are exercising so that the exercises are a pleasant break in your day. This will make your life better with the exercises acting as a break in your routine that you can look forward to.   Perform all exercises about fifteen times, three times per day or as  directed.  You should exercise both the operative leg and the other leg as well.   Exercises include:   Quad Sets - Tighten up the muscle on the front of the thigh (Quad) and hold for 5-10 seconds.   Straight Leg Raises - With your knee straight (if you were given a brace, keep it on),  lift the leg to 60 degrees, hold for 3 seconds, and slowly lower the leg.  Perform this exercise against resistance later as your leg gets stronger.  Leg Slides: Lying on your back, slowly slide your foot toward your buttocks, bending your knee up off the floor (only go as far as is comfortable). Then slowly slide your foot back down until your leg is flat on the floor again.  Angel Wings: Lying on your back spread your legs to the side as far apart as you can without causing discomfort.  Hamstring Strength:  Lying on your back, push your heel against the floor with your leg straight by tightening up the muscles of your buttocks.  Repeat, but this time bend your knee to a comfortable angle, and push your heel against the floor.  You may put a pillow under the heel to make it more comfortable if necessary.   A rehabilitation program following joint replacement surgery can speed recovery and prevent re-injury in the future due to weakened muscles. Contact your doctor or a physical therapist for more information on knee rehabilitation.    CONSTIPATION  Constipation is defined medically as fewer than three stools per week and severe constipation as less than one stool per week.  Even if you have a regular bowel pattern at home, your normal regimen is likely to be disrupted due to multiple reasons following surgery.  Combination of anesthesia, postoperative narcotics, change in appetite and fluid intake all can affect your bowels.   YOU MUST use at least one of the following options; they are listed in order of increasing strength to get the job done.  They are all available over the counter, and you may need to use some, POSSIBLY even all of these options:    Drink plenty of fluids (prune juice may be helpful) and high fiber foods Colace 100 mg by mouth twice a day  Senokot for constipation as directed and as needed Dulcolax (bisacodyl), take with full glass of water  Miralax (polyethylene glycol) once  or twice a day as needed.  If you have tried all these things and are unable to have a bowel movement in the first 3-4 days after surgery call either your surgeon or your primary doctor.    If you experience loose stools or diarrhea, hold the medications until you stool forms back up.  If your symptoms do not get better within 1 week or if they get worse, check with your doctor.  If you experience "the worst abdominal pain ever" or develop nausea or vomiting, please contact the office immediately for further recommendations for treatment.   ITCHING:  If you experience itching with your medications, try taking only a single pain pill, or even half a pain pill at a time.  You can also use Benadryl over the counter for itching or also to help with sleep.   TED HOSE STOCKINGS:  Use stockings on both legs until for at least 2 weeks or as directed by physician office. They may be removed at night for sleeping.  MEDICATIONS:  See your medication summary on the "After Visit Summary" that nursing will  review with you.  You may have some home medications which will be placed on hold until you complete the course of blood thinner medication.  It is important for you to complete the blood thinner medication as prescribed.  PRECAUTIONS:  If you experience chest pain or shortness of breath - call 911 immediately for transfer to the hospital emergency department.   If you develop a fever greater that 101 F, purulent drainage from wound, increased redness or drainage from wound, foul odor from the wound/dressing, or calf pain - CONTACT YOUR SURGEON.                                                   FOLLOW-UP APPOINTMENTS:  If you do not already have a post-op appointment, please call the office for an appointment to be seen by your surgeon.  Guidelines for how soon to be seen are listed in your "After Visit Summary", but are typically between 1-4 weeks after surgery.  OTHER INSTRUCTIONS:   Knee Replacement:  Do  not place pillow under knee, focus on keeping the knee straight while resting. CPM instructions: 0-90 degrees, 2 hours in the morning, 2 hours in the afternoon, and 2 hours in the evening. Place foam block, curve side up under heel at all times except when in CPM or when walking.  DO NOT modify, tear, cut, or change the foam block in any way.  MAKE SURE YOU:  Understand these instructions.  Get help right away if you are not doing well or get worse.    Thank you for letting us be a part of your medical care team.  It is a privilege we respect greatly.  We hope these instructions will help you stay on track for a fast and full recovery!   Increase activity slowly as tolerated   Complete by:  As directed       Follow-up Information    Marcene Corning, MD. Schedule an appointment as soon as possible for a visit in 2 weeks.   Specialty:  Orthopedic Surgery Contact information: [address] [place] [address] [phone]        Georgeanna Lea, MD Follow up.   Specialty:  Cardiology Why:  [facility] [place] office - please note, address may be DIFFERENT than the last time you saw Dr. Bing Matter. This is scheduled 2/5 at 3 pm on [facility]. Contact information: [address] [place] [address] [phone]            Signed: Ginger Organ  10/04/2018, 1:11 PM

## 2018-10-04 NOTE — Progress Notes (Signed)
Progress Note  Patient Name: Peter CrimesScott C Boucher Date of Encounter: 10/04/2018  Primary Cardiologist: Gypsy Balsamobert Krasowski, MD   Subjective   No palpitations, dizziness, chest pain or SOB.   Inpatient Medications    Scheduled Meds: . amLODipine  10 mg Oral Daily  . cloNIDine  0.2 mg Oral TID  . docusate sodium  100 mg Oral BID  . hydrALAZINE  100 mg Oral TID  . lisinopril  20 mg Oral Daily   And  . hydrochlorothiazide  25 mg Oral Daily  . labetalol  300 mg Oral BID  . potassium chloride  10 mEq Oral Daily  . rivaroxaban  20 mg Oral Q supper   Continuous Infusions: . lactated ringers 50 mL/hr at 10/02/18 2112  . methocarbamol (ROBAXIN) IV     PRN Meds: acetaminophen, alum & mag hydroxide-simeth, bisacodyl, diphenhydrAMINE, HYDROmorphone (DILAUDID) injection, menthol-cetylpyridinium **OR** phenol, methocarbamol **OR** methocarbamol (ROBAXIN) IV, metoCLOPramide **OR** metoCLOPramide (REGLAN) injection, ondansetron **OR** ondansetron (ZOFRAN) IV, oxyCODONE   Vital Signs    Vitals:   10/03/18 2004 10/03/18 2205 10/04/18 0351 10/04/18 0936  BP: 117/72 139/64 127/82 (!) 154/94  Pulse: 70 81 68 79  Resp: 18  18   Temp: 97.8 F (36.6 C)  98 F (36.7 C)   TempSrc: Oral  Oral   SpO2: 98%  99%   Weight:      Height:        Intake/Output Summary (Last 24 hours) at 10/04/2018 0950 Last data filed at 10/03/2018 1245 Gross per 24 hour  Intake 480 ml  Output 650 ml  Net -170 ml   Last 3 Weights 10/02/2018 09/25/2018 12/22/2016  Weight (lbs) 250 lb 254 lb 6.4 oz 257 lb  Weight (kg) 113.399 kg 115.395 kg 116.574 kg      Telemetry    Afib with controlled rate - Personally Reviewed  ECG    None today - Personally Reviewed  Physical Exam   GEN: No acute distress.   Neck: No JVD Cardiac: IRRR, no murmurs, rubs, or gallops.  Respiratory: Clear to auscultation bilaterally. GI: Soft, nontender, non-distended  MS: No edema; No deformity. Neuro:  Nonfocal  Psych: Normal  affect   Labs    Chemistry Recent Labs  Lab 10/03/18 1636  NA 133*  K 3.8  CL 97*  CO2 25  GLUCOSE 112*  BUN 14  CREATININE 0.75  CALCIUM 8.8*  GFRNONAA >60  GFRAA >60  ANIONGAP 11     Hematology Recent Labs  Lab 10/03/18 0729  WBC 18.5*  RBC 4.07*  HGB 12.7*  HCT 37.4*  MCV 91.9  MCH 31.2  MCHC 34.0  RDW 12.2  PLT 234    Cardiac EnzymesNo results for input(s): TROPONINI in the last 168 hours. No results for input(s): TROPIPOC in the last 168 hours.   BNPNo results for input(s): BNP, PROBNP in the last 168 hours.   DDimer No results for input(s): DDIMER in the last 168 hours.   Radiology    No results found.  Cardiac Studies   Echo: Study Conclusions  - Left ventricle: The cavity size was normal. There was mild   concentric hypertrophy. Systolic function was normal. The   estimated ejection fraction was in the range of 60% to 65%. Wall   motion was normal; there were no regional wall motion   abnormalities. - Aortic valve: Transvalvular velocity was within the normal range.   There was no stenosis. There was no regurgitation. - Mitral valve: Transvalvular velocity was  within the normal range.   There was no evidence for stenosis. There was no regurgitation. - Left atrium: The atrium was mildly dilated. - Right ventricle: The cavity size was normal. Wall thickness was   normal. Systolic function was normal. - Right atrium: The atrium was moderately dilated. - Tricuspid valve: There was trivial regurgitation. - Pulmonary arteries: Systolic pressure was within the normal   range.  Patient Profile     53 y.o. male s/p TKR with new onset afib.  Assessment & Plan    1. Right TKA: POD#2. Management per ortho.   2. Atrial Fibrillation: new diagnosis. In the setting of recent orthopedic surgery. Current rates well controlled on labetalol. Asymptomatic.  Volume status appears stable. Electrolytes are normal. TSH normal. Echo shows mild LVH with  normal systolic function. His CHA2DS2 VASc score is only 1 for HTN. This patients CHA2DS2-VASc Score and unadjusted Ischemic Stroke Rate (% per year) is equal to 0.6 % stroke rate/year from a score of 1. His yearly risk for CVA is low, thus may not need long term anticoagulation, however we recommend starting oral anticoagulation, at least short term, in case he may require electrical cardioversion to restore NSR. On Xarelto. Would avoid ASA/NSAIDs due to bleeding risk.  After 3 weeks of anticoagulation, we can arrange outpatient DCCV if still in atrial fibrillation. Also given his h/o snoring, apneic spells and body habitus, would recommend outpatient sleep study to r/o OSA.   Above score calculated as 1 point each if present [CHF, HTN, DM, Vascular=MI/PAD/Aortic Plaque, Age if 65-74, or Male] Above score calculated as 2 points each if present [Age > 75, or Stroke/TIA/TE]   3. HTN: Controlled on current meds. Continue and monitor.    [facility] will sign off.   Medication Recommendations:  As noted above Other recommendations (labs, testing, etc):  none Follow up as an outpatient:  With Dr. Gypsy Balsam in 2-3 weeks.  For questions or updates, please contact [facility] Please consult [email or web address] for contact info under        Signed,  [place], MD  10/04/2018, 9:50 AM

## 2018-10-04 NOTE — Progress Notes (Signed)
AVS given and reviewed with pt. Medications reviewed and discussed. All questions answered to satisfaction. Walker delivered to bedside. Pt escorted off the unit via wheelchair by volunteer services.

## 2018-10-04 NOTE — Progress Notes (Signed)
F/u appears to have been arranged already yesterday with cardiology office 2/5 with Dr. Bing MatterKrasowski. Added appointment information to AVS. I originally had asked nurse to ask pt if he was OK with f/u in [place] location due to appt availability, but then saw this appointment in [place] already scheduled. This address is likely different than when he last saw Dr. Janyce LlanosKrasowksi so I included this info on AVS and also asked nurse to make pt aware.    PA-C

## 2018-10-04 NOTE — Progress Notes (Signed)
PT Cancellation Note  Patient Details Name: Peter Thompson MRN: [id number] DOB: 06-20-1966   Cancelled Treatment:    Reason Eval/Treat Not Completed: Other (comment)(declined PT today and feels he does not need it).  Will reattempt this afternoon to be sure he has not changed his mind, then DC the PT order.   Ivar Drape 10/04/2018, 9:28 AM   Samul Dada, PT MS Acute Rehab Dept. Number: [facility] [id number] and [facility] [phone]

## 2018-10-04 NOTE — Care Management Note (Signed)
Case Management Note  Patient Details  Name: Peter CrimesScott C Babson MRN: [id number] Date of Birth: [birth year]  Subjective/Objective:  53 yr old gentleman s/p right total knee arthroplasty.                  Action/Plan: Case manager spoke with patient concerning discharge plan and DME. Patient says he is not going to do therapy, " he ran circles around the last therapist". Patient says his wife will be assisting him at discharge.     Expected Discharge Date:  10/04/18               Expected Discharge Plan:  Home/Self Care  In-House Referral:  NA  Discharge planning Services  CM Consult  Post Acute Care Choice:  Durable Medical Equipment Choice offered to:  Patient  DME Arranged:  3-N-1, Walker rolling DME Agency:  [facility]  HH Arranged:  NA(patient declines) HH Agency:  NA  Status of Service:  Completed, signed off  If discussed at Long Length of Stay Meetings, dates discussed:    Additional Comments:  Durenda GuthrieBrady,  Naomi, RN 10/04/2018, 1:49 PM

## 2018-10-10 ENCOUNTER — Emergency Department (HOSPITAL_COMMUNITY)
Admission: EM | Admit: 2018-10-10 | Discharge: 2018-10-10 | Disposition: A | Discharge status: Home / Self Care | Payer: Medicaid Other | Attending: Emergency Medicine | Admitting: Emergency Medicine

## 2018-10-10 ENCOUNTER — Other Ambulatory Visit: Payer: Self-pay

## 2018-10-10 ENCOUNTER — Emergency Department (HOSPITAL_COMMUNITY): Payer: Medicaid Other

## 2018-10-10 ENCOUNTER — Encounter (HOSPITAL_COMMUNITY): Payer: Self-pay | Admitting: Emergency Medicine

## 2018-10-10 ENCOUNTER — Emergency Department (HOSPITAL_BASED_OUTPATIENT_CLINIC_OR_DEPARTMENT_OTHER)
Admit: 2018-10-10 | Discharge: 2018-10-10 | Disposition: A | Discharge status: Home / Self Care | Payer: Medicaid Other | Attending: Emergency Medicine | Admitting: Emergency Medicine

## 2018-10-10 DIAGNOSIS — I10 Essential (primary) hypertension: Secondary | ICD-10-CM | POA: Diagnosis not present

## 2018-10-10 DIAGNOSIS — R609 Edema, unspecified: Secondary | ICD-10-CM | POA: Diagnosis not present

## 2018-10-10 DIAGNOSIS — Z96651 Presence of right artificial knee joint: Secondary | ICD-10-CM | POA: Diagnosis not present

## 2018-10-10 DIAGNOSIS — D649 Anemia, unspecified: Secondary | ICD-10-CM

## 2018-10-10 DIAGNOSIS — M79604 Pain in right leg: Secondary | ICD-10-CM | POA: Insufficient documentation

## 2018-10-10 DIAGNOSIS — M7989 Other specified soft tissue disorders: Secondary | ICD-10-CM | POA: Diagnosis not present

## 2018-10-10 DIAGNOSIS — F17210 Nicotine dependence, cigarettes, uncomplicated: Secondary | ICD-10-CM | POA: Diagnosis not present

## 2018-10-10 LAB — BASIC METABOLIC PANEL
ANION GAP: 10 (ref 5–15)
BUN: 8 mg/dL (ref 6–20)
CO2: 28 mmol/L (ref 22–32)
Calcium: 9 mg/dL (ref 8.9–10.3)
Chloride: 90 mmol/L — ABNORMAL LOW (ref 98–111)
Creatinine, Ser: 0.58 mg/dL — ABNORMAL LOW (ref 0.61–1.24)
GFR calc Af Amer: >60 mL/min (ref >60)
GFR calc non Af Amer: >60 mL/min (ref >60)
Glucose, Bld: 130 mg/dL — ABNORMAL HIGH (ref 70–99)
Potassium: 3.9 mmol/L (ref 3.5–5.1)
Sodium: 128 mmol/L — ABNORMAL LOW (ref 135–145)

## 2018-10-10 LAB — CBC WITH DIFFERENTIAL/PLATELET
Abs Immature Granulocytes: 0.04 10*3/uL (ref 0.00–0.07)
Basophils Absolute: 0 10*3/uL (ref 0.0–0.1)
Basophils Relative: 0 %
Eosinophils Absolute: 0.1 10*3/uL (ref 0.0–0.5)
Eosinophils Relative: 1 %
HCT: 27.8 % — ABNORMAL LOW (ref 39.0–52.0)
HEMOGLOBIN: 9.6 g/dL — AB (ref 13.0–17.0)
Immature Granulocytes: 0 %
LYMPHS PCT: 11 %
Lymphs Abs: 1 10*3/uL (ref 0.7–4.0)
MCH: 32.3 pg (ref 26.0–34.0)
MCHC: 34.5 g/dL (ref 30.0–36.0)
MCV: 93.6 fL (ref 80.0–100.0)
MONO ABS: 1.6 10*3/uL — AB (ref 0.1–1.0)
Monocytes Relative: 17 %
Neutro Abs: 6.9 10*3/uL (ref 1.7–7.7)
Neutrophils Relative %: 71 %
Platelets: 327 10*3/uL (ref 150–400)
RBC: 2.97 MIL/uL — ABNORMAL LOW (ref 4.22–5.81)
RDW: 12.5 % (ref 11.5–15.5)
WBC: 9.7 10*3/uL (ref 4.0–10.5)
nRBC: 0 % (ref 0.0–0.2)

## 2018-10-10 LAB — I-STAT TROPONIN, ED: Troponin i, poc: 0 ng/mL (ref 0.00–0.08)

## 2018-10-10 MED ORDER — IOHEXOL 300 MG/ML  SOLN
100.0000 mL | Freq: Once | INTRAMUSCULAR | Status: AC | PRN
  Administered 2018-10-10: 100 mL via INTRAVENOUS

## 2018-10-10 MED ORDER — OXYCODONE HCL 5 MG PO TABS
20.0000 mg | ORAL_TABLET | Freq: Once | ORAL | Status: AC
  Administered 2018-10-10: 20 mg via ORAL
  Filled 2018-10-10: qty 4

## 2018-10-10 MED ORDER — IOPAMIDOL (ISOVUE-370) INJECTION 76%
INTRAVENOUS | Status: AC
  Filled 2018-10-10: qty 100

## 2018-10-10 MED ORDER — HYDROMORPHONE HCL 1 MG/ML IJ SOLN
1.0000 mg | Freq: Once | INTRAMUSCULAR | Status: AC
  Administered 2018-10-10: 1 mg via INTRAVENOUS
  Filled 2018-10-10: qty 1

## 2018-10-10 NOTE — ED Triage Notes (Signed)
Pt reports r knee replacement last Tuesday. During surgery he went into a-fib. Pt states he is a-fib now and report r knee pain and leg swelling

## 2018-10-10 NOTE — ED Provider Notes (Signed)
Care assumed from previous provider PA Gekas. Please see note for further details. Case discussed, plan agreed upon. Will follow up on pending CT imaging.  If negative, likely discharged home with orthopedic follow-up.  CT with no DVT.  Discussed with Greig Castilla the orthopedic PA who saw him in clinic.  Will discharge him home.  Greig Castilla will call in prescription for a few more pain pills, and talk with his primary care team tomorrow to hopefully come up with a long-term pain management solution.  Discussed this plan with patient who is in agreement.  He will follow-up with PCP as scheduled on Friday and Ortho as recommended when they call him.  Reasons to return to the emergency department were discussed.  All questions answered.  Patient discharged in satisfactory condition.    , Chase Picket, PA-C 10/10/18 1916    Doug Sou, MD 10/11/18 [phone]

## 2018-10-10 NOTE — Discharge Instructions (Addendum)
Your orthopedic PA Greig Castilla is sending a prescription for pain medication to the [facility] as we discussed. He plans on getting in touch with you in the next few days to discuss long term pain relief options.   It is very important that you keep your leg elevated and wear compression stocking!   Return to ER for new or worsening symptoms, any additional concerns.

## 2018-10-10 NOTE — ED Notes (Signed)
Patient given discharge instructions and verbalized understanding.  Patient stable to discharge at this time.  Patient is alert and oriented to baseline.  No distressed noted at this time.  All belongings taken with the patient at discharge.   

## 2018-10-10 NOTE — Consult Note (Signed)
Reason for Consult:RLE swelling Referring Physician: R Julian Male is an 53 y.o. male.  HPI: Cadyn underwent TKA by Dr. Jerl Santos on 1/21. His leg has become progressively more swollen and painful since discharge. He did develop new-onset a fib during his hospitalization and was discharged on Xarelto. He's been keeping his leg elevated and doing his exercises. His pain is severe but is not limited to his knee but rather comprises his entire leg.  Past Medical History:  Diagnosis Date  . Anxiety   . Arthritis   . Bursitis   . Complication of anesthesia    "woke up in OR- IV came out."  . Concussion    several- head injury- was in milatary  . DJD (degenerative joint disease)   . Hypertension   . Insomnia   . Neck fracture (HCC)   . Tinnitus, bilateral     Past Surgical History:  Procedure Laterality Date  . ANKLE SURGERY Left   . Arm surgery Right    fracture repair  . CERVICAL SPINE SURGERY     bone graft from left hip  . HAND SURGERY Left    drains for Infection  . HIP SURGERY Left    "scrapped" x2  . KNEE ARTHROSCOPY Right   . LEG SURGERY Left    "1 inch took out"  . SHOULDER ARTHROSCOPY Left 12/27/2016   Procedure: ARTHROSCOPY SHOULDER;  Surgeon: Marcene Corning, MD;  Location: [facility] OR;  Service: Orthopedics;  Laterality: Left;  Debridement, AC Decompression, Acromioplasty   . TOTAL HIP ARTHROPLASTY Left 05/10/2016   Procedure: TOTAL HIP ARTHROPLASTY ANTERIOR APPROACH;  Surgeon: Marcene Corning, MD;  Location: [facility] OR;  Service: Orthopedics;  Laterality: Left;  . TOTAL KNEE ARTHROPLASTY Right 10/02/2018   Procedure: TOTAL KNEE ARTHROPLASTY;  Surgeon: Marcene Corning, MD;  Location: [facility] OR;  Service: Orthopedics;  Laterality: Right;  . WRIST SURGERY Right    2 Pinns    Family History  Problem Relation Age of Onset  . Heart disease Neg Hx     Social History:  reports that he has been smoking cigarettes. He has a 28.00 pack-year smoking history. His smokeless  tobacco use includes chew. He reports current alcohol use of about 12.0 standard drinks of alcohol per week. He reports that he does not use drugs.  Allergies: No Known Allergies  Medications: I have reviewed the patient's current medications.  Results for orders placed or performed during the hospital encounter of 10/10/18 (from the past 48 hour(s))  Basic metabolic panel     Status: Abnormal   Collection Time: 10/10/18 11:19 AM  Result Value Ref Range   Sodium 128 (L) 135 - 145 mmol/L   Potassium 3.9 3.5 - 5.1 mmol/L   Chloride 90 (L) 98 - 111 mmol/L   CO2 28 22 - 32 mmol/L   Glucose, Bld 130 (H) 70 - 99 mg/dL   BUN 8 6 - 20 mg/dL   Creatinine, Ser 8.50 (L) 0.61 - 1.24 mg/dL   Calcium 9.0 8.9 - 27.7 mg/dL   GFR calc non Af Amer >60 >60 mL/min   GFR calc Af Amer >60 >60 mL/min   Anion gap 10 5 - 15    Comment: Performed at [facility], [address] [place], [place] [address]  CBC with Differential     Status: Abnormal   Collection Time: 10/10/18 11:19 AM  Result Value Ref Range   WBC 9.7 4.0 - 10.5 K/uL   RBC 2.97 (L)  4.22 - 5.81 MIL/uL   Hemoglobin 9.6 (L) 13.0 - 17.0 g/dL   HCT 30.1 (L) 60.1 - 09.3 %   MCV 93.6 80.0 - 100.0 fL   MCH 32.3 26.0 - 34.0 pg   MCHC 34.5 30.0 - 36.0 g/dL   RDW 23.5 57.3 - 22.0 %   Platelets 327 150 - 400 K/uL   nRBC 0.0 0.0 - 0.2 %   Neutrophils Relative % 71 %   Neutro Abs 6.9 1.7 - 7.7 K/uL   Lymphocytes Relative 11 %   Lymphs Abs 1.0 0.7 - 4.0 K/uL   Monocytes Relative 17 %   Monocytes Absolute 1.6 (H) 0.1 - 1.0 K/uL   Eosinophils Relative 1 %   Eosinophils Absolute 0.1 0.0 - 0.5 K/uL   Basophils Relative 0 %   Basophils Absolute 0.0 0.0 - 0.1 K/uL   Immature Granulocytes 0 %   Abs Immature Granulocytes 0.04 0.00 - 0.07 K/uL    Comment: Performed at [facility], [address], [place], [place] [address]  I-stat troponin, ED     Status: None   Collection Time: 10/10/18 11:37 AM  Result Value Ref Range   Troponin i,  poc 0.00 0.00 - 0.08 ng/mL   Comment 3            Comment: Due to the release kinetics of cTnI, a negative result within the first hours of the onset of symptoms does not rule out myocardial infarction with certainty. If myocardial infarction is still suspected, repeat the test at appropriate intervals.     Dg Chest Port 1 View  Result Date: 10/10/2018 CLINICAL DATA:  Atrial fib, wheezing EXAM: PORTABLE CHEST 1 VIEW COMPARISON:  09/25/2018 FINDINGS: Cardiac enlargement with pulmonary artery enlargement unchanged. Negative for heart failure. Negative for infiltrate or effusion. No change from the prior study. IMPRESSION: No active disease. Electronically Signed   By: Marlan Palau M.D.   On: 10/10/2018 11:43   Vas [place] Lower Extremity Venous (dvt) (only [facility])  Result Date: 10/10/2018  Lower Venous Study Indications: Swelling, Edema, and knee replacement 1 week ago.  Performing Technologist: Levada Schilling RDMS, RVT  Examination Guidelines: A complete evaluation includes B-mode imaging, spectral Doppler, color Doppler, and power Doppler as needed of all accessible portions of each vessel. Bilateral testing is considered an integral part of a complete examination. Limited examinations for reoccurring indications may be performed as noted.  Right Venous Findings: +---------+---------------+---------+-----------+----------+------------------+          CompressibilityPhasicitySpontaneityPropertiesSummary            +---------+---------------+---------+-----------+----------+------------------+ CFV      Full           Yes      Yes                                     +---------+---------------+---------+-----------+----------+------------------+ SFJ      Full                                                            +---------+---------------+---------+-----------+----------+------------------+ FV Prox  Full                                                             +---------+---------------+---------+-----------+----------+------------------+  FV Mid   Full                                                            +---------+---------------+---------+-----------+----------+------------------+ FV Distal                                             significant                                                              swelling           +---------+---------------+---------+-----------+----------+------------------+ PFV      Full                                                            +---------+---------------+---------+-----------+----------+------------------+ POP      Full           Yes      Yes                                     +---------+---------------+---------+-----------+----------+------------------+ PTV                                                   poor visualization +---------+---------------+---------+-----------+----------+------------------+ PERO                                                  poor visualization +---------+---------------+---------+-----------+----------+------------------+ GSV      Full                                                            +---------+---------------+---------+-----------+----------+------------------+  Left Venous Findings: +---+---------------+---------+-----------+----------+-------+    CompressibilityPhasicitySpontaneityPropertiesSummary +---+---------------+---------+-----------+----------+-------+ CFVFull           Yes      Yes                          +---+---------------+---------+-----------+----------+-------+    Summary: Right: There is no evidence of deep vein thrombosis in the lower extremity. However, portions of this examination were limited- see technologist comments above. No cystic structure found in the popliteal fossa. difficult exam due to significant swelling,  pitting edema in lower leg. Left: No evidence of common femoral  vein obstruction.  *See  table(s) above for measurements and observations.    Preliminary     Review of Systems  Constitutional: Negative for weight loss.  HENT: Negative for ear discharge, ear pain, hearing loss and tinnitus.   Eyes: Negative for blurred vision, double vision, photophobia and pain.  Respiratory: Negative for cough, sputum production and shortness of breath.   Cardiovascular: Negative for chest pain.  Gastrointestinal: Negative for abdominal pain, nausea and vomiting.  Genitourinary: Negative for dysuria, flank pain, frequency and urgency.  Musculoskeletal: Positive for joint pain (RLE). Negative for back pain, falls, myalgias and neck pain.  Neurological: Negative for dizziness, tingling, sensory change, focal weakness, loss of consciousness and headaches.  Endo/Heme/Allergies: Does not bruise/bleed easily.  Psychiatric/Behavioral: Negative for depression, memory loss and substance abuse. The patient is not nervous/anxious.    Blood pressure 135/70, pulse 78, temperature 98 F (36.7 C), temperature source Oral, resp. rate (!) 27, height 5\' 9"  (1.753 m), weight 113.4 kg, SpO2 96 %. Physical Exam  Constitutional: He appears well-developed and well-nourished. No distress.  HENT:  Head: Normocephalic and atraumatic.  Eyes: Conjunctivae are normal. Right eye exhibits no discharge. Left eye exhibits no discharge. No scleral icterus.  Neck: Normal range of motion.  Cardiovascular: Normal rate. An irregularly irregular rhythm present.  Respiratory: Effort normal. No respiratory distress.  Musculoskeletal:     Comments: LLE No traumatic wounds, ecchymosis, or rash  Surgical site healing well without marked erythema or discharge  AROM ~15 degrees, PROM ~30 degrees  Sens DPN, SPN, TN intact  Motor EHL, ext, flex, evers 5/5  DP 2+, PT 0, 3-4+ pitting edema to mid-thigh  Neurological: He is alert.  Skin: Skin is warm and dry. He is not diaphoretic.  Psychiatric: He has a normal  mood and affect. His behavior is normal.    Assessment/Plan: RLE edema -- Despite negative US I am still most concerned about a DVT. The sonographer notes technical issues in the proximal thigh. I d/w EDP who will order a more sensitive study. If negative, I have suggested continuous leg elevation and thigh-high compression hose. If positive he will likely need medical admission and possible recalibration of his anticoagulant. In either event he should see Dr. Jerl Santosalldorf at his regularly scheduled appt.  ABL anemia -- While more than expected is not concerning from surgical standpoint.    Freeman CaldronMichael J. , PA-C Orthopedic Surgery [phone] 10/10/2018, 3:27 PM

## 2018-10-10 NOTE — ED Provider Notes (Signed)
[facility] EMERGENCY DEPARTMENT Provider Note   CSN: [id number] Arrival date & time: 10/10/18  1020     History   Chief Complaint Chief Complaint  Patient presents with  . Atrial Fibrillation  . Joint Swelling    HPI BRAYCEN BURANDT is a 53 y.o. male who presents with palpitations and right leg pain.  Past medical history significant for chronic pain, narcotic dependence, atrial fibrillation on Xarelto.  He states that he had a knee replacement by Dr. Jerl Santos 1 week ago.  Postop course was remarkable for new onset A.fib. He was seen by Cardiology while in the hospital and they recommended starting him on Xarelto and office follow up.  He takes 120 mg of oxycodone a day and states that his pain is uncontrolled and severe.  His leg is diffusely swollen.  Today he had palpitations and his heart rate was elevated so he decided to come to the emergency department.  He denies dizziness, syncope, chest pain, shortness of breath, fevers.  He is taking all his medication as prescribed.  He has been trying to ice and elevate his leg without significant relief of his pain.  HPI  Past Medical History:  Diagnosis Date  . Anxiety   . Arthritis   . Bursitis   . Complication of anesthesia    "woke up in OR- IV came out."  . Concussion    several- head injury- was in milatary  . DJD (degenerative joint disease)   . Hypertension   . Insomnia   . Neck fracture (HCC)   . Tinnitus, bilateral     Patient Active Problem List   Diagnosis Date Noted  . Persistent atrial fibrillation   . Primary osteoarthritis of right knee 10/02/2018  . Primary localized osteoarthritis of left hip 05/10/2016  . Primary osteoarthritis of left hip 05/10/2016    Past Surgical History:  Procedure Laterality Date  . ANKLE SURGERY Left   . Arm surgery Right    fracture repair  . CERVICAL SPINE SURGERY     bone graft from left hip  . HAND SURGERY Left    drains for Infection  . HIP  SURGERY Left    "scrapped" x2  . KNEE ARTHROSCOPY Right   . LEG SURGERY Left    "1 inch took out"  . SHOULDER ARTHROSCOPY Left 12/27/2016   Procedure: ARTHROSCOPY SHOULDER;  Surgeon: Marcene Corning, MD;  Location: [facility] OR;  Service: Orthopedics;  Laterality: Left;  Debridement, AC Decompression, Acromioplasty   . TOTAL HIP ARTHROPLASTY Left 05/10/2016   Procedure: TOTAL HIP ARTHROPLASTY ANTERIOR APPROACH;  Surgeon: Marcene Corning, MD;  Location: [facility] OR;  Service: Orthopedics;  Laterality: Left;  . TOTAL KNEE ARTHROPLASTY Right 10/02/2018   Procedure: TOTAL KNEE ARTHROPLASTY;  Surgeon: Marcene Corning, MD;  Location: [facility] OR;  Service: Orthopedics;  Laterality: Right;  . WRIST SURGERY Right    2 Pinns        Home Medications    Prior to Admission medications   Medication Sig Start Date End Date Taking? Authorizing Provider  amLODipine (NORVASC) 10 MG tablet Take 10 mg by mouth daily.    [provider]  cloNIDine (CATAPRES) 0.2 MG tablet Take 0.2 mg by mouth 3 (three) times daily.    [provider]  hydrALAZINE (APRESOLINE) 100 MG tablet Take 100 mg by mouth 3 (three) times daily.    [provider]  labetalol (NORMODYNE) 300 MG tablet Take 1 tablet (300 mg total) by mouth 2 (  two) times daily. 10/04/18   Elodia Florence, PA-C  lisinopril-hydrochlorothiazide (PRINZIDE,ZESTORETIC) 20-25 MG tablet Take 1 tablet by mouth daily.    [provider]  methocarbamol (ROBAXIN) 750 MG tablet Take 1 tablet (750 mg total) by mouth every 6 (six) hours as needed for muscle spasms. 10/04/18   Elodia Florence, PA-C  Multiple Vitamins-Minerals (MULTIVITAMIN WITH MINERALS) tablet Take 1 tablet by mouth daily.    [provider]  oxyCODONE 20 MG TABS Take 1 tablet (20 mg total) by mouth every 4 (four) hours as needed (severe pain). 10/04/18   Elodia Florence, PA-C  potassium chloride (K-DUR) 10 MEQ tablet Take 10 mEq by mouth daily.    [provider]  rivaroxaban  (XARELTO) 20 MG TABS tablet Take 1 tablet (20 mg total) by mouth daily with supper. 10/04/18   Elodia Florence, PA-C  triamcinolone cream (KENALOG) 0.1 % Apply 1 application topically 2 (two) times daily.    [provider]    Family History Family History  Problem Relation Age of Onset  . Heart disease Neg Hx     Social History Social History   Tobacco Use  . Smoking status: Current Every Day Smoker    Packs/day: 0.50    Years: 56.00    Pack years: 28.00    Types: Cigarettes  . Smokeless tobacco: Current User    Types: Chew  . Tobacco comment: occasional - chew  Substance Use Topics  . Alcohol use: Yes    Alcohol/week: 12.0 standard drinks    Types: 12 Cans of beer per week  . Drug use: No     Allergies   Patient has no known allergies.   Review of Systems Review of Systems  Constitutional: Negative for fever.  Respiratory: Negative for cough and shortness of breath.   Cardiovascular: Positive for palpitations and leg swelling. Negative for chest pain.  Musculoskeletal: Positive for arthralgias and joint swelling.  Skin: Positive for color change.  Neurological: Negative for dizziness, weakness and headaches.  All other systems reviewed and are negative.    Physical Exam Updated Vital Signs BP 128/89 (BP Location: Left Arm)   Pulse (!) 120   Temp 98 F (36.7 C) (Oral)   Resp 16   Ht 5\' 9"  (1.753 m)   Wt 113.4 kg   SpO2 98%   BMI 36.92 kg/m   Physical Exam Vitals signs and nursing note reviewed.  Constitutional:      General: He is not in acute distress.    Appearance: He is well-developed. He is obese.     Comments: Chronically ill-appearing.  Cooperative  HENT:     Head: Normocephalic and atraumatic.  Eyes:     General: No scleral icterus.       Right eye: No discharge.        Left eye: No discharge.     Conjunctiva/sclera: Conjunctivae normal.     Pupils: Pupils are equal, round, and reactive to light.  Neck:     Musculoskeletal:  Normal range of motion.  Cardiovascular:     Rate and Rhythm: Tachycardia present. Rhythm irregularly irregular.  Pulmonary:     Effort: Pulmonary effort is normal. No respiratory distress.     Breath sounds: Wheezing (Diffuse) present.  Abdominal:     General: There is no distension.  Musculoskeletal:     Comments: Right leg is diffusely swollen.  Incision is healing without evidence of infection.  There is mild erythema surrounding the incision.  There is diffuse  swelling and erythema of the calf.  Intact distal pulses.  Skin:    General: Skin is warm and dry.  Neurological:     Mental Status: He is alert and oriented to person, place, and time.  Psychiatric:        Behavior: Behavior normal.      ED Treatments / Results  Labs (all labs ordered are listed, but only abnormal results are displayed) Labs Reviewed  BASIC METABOLIC PANEL - Abnormal; Notable for the following components:      Result Value   Sodium 128 (*)    Chloride 90 (*)    Glucose, Bld 130 (*)    Creatinine, Ser 0.58 (*)    All other components within normal limits  CBC WITH DIFFERENTIAL/PLATELET - Abnormal; Notable for the following components:   RBC 2.97 (*)    Hemoglobin 9.6 (*)    HCT 27.8 (*)    Monocytes Absolute 1.6 (*)    All other components within normal limits  I-STAT TROPONIN, ED    EKG EKG Interpretation  Date/Time:  Wednesday October 10 2018 10:32:12 EST Ventricular Rate:  84 PR Interval:    QRS Duration: 78 QT Interval:  370 QTC Calculation: 438 R Axis:   70 Text Interpretation:  Atrial fibrillation ST elev, probable normal early repol pattern Abnormal ekg Confirmed by Gerhard MunchLockwood, Robert [phone]([id number]) on 10/10/2018 11:21:17 AM   Radiology Dg Chest Port 1 View  Result Date: 10/10/2018 CLINICAL DATA:  Atrial fib, wheezing EXAM: PORTABLE CHEST 1 VIEW COMPARISON:  09/25/2018 FINDINGS: Cardiac enlargement with pulmonary artery enlargement unchanged. Negative for heart failure. Negative for  infiltrate or effusion. No change from the prior study. IMPRESSION: No active disease. Electronically Signed   By: Marlan Palauharles  Clark M.D.   On: 10/10/2018 11:43   Vas [place] Lower Extremity Venous (dvt) (only [facility])  Result Date: 10/10/2018  Lower Venous Study Indications: Swelling, Edema, and knee replacement 1 week ago.  Performing Technologist: Levada Schillingharlotte Bynum RDMS, RVT  Examination Guidelines: A complete evaluation includes B-mode imaging, spectral Doppler, color Doppler, and power Doppler as needed of all accessible portions of each vessel. Bilateral testing is considered an integral part of a complete examination. Limited examinations for reoccurring indications may be performed as noted.  Right Venous Findings: +---------+---------------+---------+-----------+----------+------------------+          CompressibilityPhasicitySpontaneityPropertiesSummary            +---------+---------------+---------+-----------+----------+------------------+ CFV      Full           Yes      Yes                                     +---------+---------------+---------+-----------+----------+------------------+ SFJ      Full                                                            +---------+---------------+---------+-----------+----------+------------------+ FV Prox  Full                                                            +---------+---------------+---------+-----------+----------+------------------+  FV Mid   Full                                                            +---------+---------------+---------+-----------+----------+------------------+ FV Distal                                             significant                                                              swelling           +---------+---------------+---------+-----------+----------+------------------+ PFV      Full                                                             +---------+---------------+---------+-----------+----------+------------------+ POP      Full           Yes      Yes                                     +---------+---------------+---------+-----------+----------+------------------+ PTV                                                   poor visualization +---------+---------------+---------+-----------+----------+------------------+ PERO                                                  poor visualization +---------+---------------+---------+-----------+----------+------------------+ GSV      Full                                                            +---------+---------------+---------+-----------+----------+------------------+  Left Venous Findings: +---+---------------+---------+-----------+----------+-------+    CompressibilityPhasicitySpontaneityPropertiesSummary +---+---------------+---------+-----------+----------+-------+ CFVFull           Yes      Yes                          +---+---------------+---------+-----------+----------+-------+    Summary: Right: There is no evidence of deep vein thrombosis in the lower extremity. However, portions of this examination were limited- see technologist comments above. No cystic structure found in the popliteal fossa. difficult exam due to significant swelling,  pitting edema in lower leg. Left: No evidence of common femoral vein obstruction.  *See  table(s) above for measurements and observations.    Preliminary     Procedures Procedures (including critical care time)  Medications Ordered in ED Medications  HYDROmorphone (DILAUDID) injection 1 mg (1 mg Intravenous Given 10/10/18 1136)  HYDROmorphone (DILAUDID) injection 1 mg (1 mg Intravenous Given 10/10/18 1338)  oxyCODONE (Oxy IR/ROXICODONE) immediate release tablet 20 mg (20 mg Oral Given 10/10/18 1339)     Initial Impression / Assessment and Plan / ED Course  I have reviewed the triage vital signs and the  nursing notes.  Pertinent labs & imaging results that were available during my care of the patient were reviewed by me and considered in my medical decision making (see chart for details).  53 year old male presents with palpitations and R leg pain and swelling s/p TK replacement last Tuesday. His HR is variable and is running between 70-low 110s. Otherwise vitals are normal. His leg is diffusely swollen. Exam is not consistent with septic joint. Pain will be difficult to control due to his opioid tolerance. Will order labs, CXR, LE DVT study. Will give pain control.  CBC is remarkable for drop in hgb from 12 to 9 in the past week. He denies any known bleeding. BMP is remarkable for hyponatremia (128). I-stat trop is 0. EKG is controlled A.fib. CXR is negative. DVT study is limited but is negative for DVT. Shared visit with Dr. Jeraldine Loots. Will consult orthopedics  Charma Igo came to see pt. He recommends a more sensitive study. CTA of right leg was ordered. If negative for DVT he can f/u as outpatient. If positive, admit. Pt care signed out to J Ward PA-C  Final Clinical Impressions(s) / ED Diagnoses   Final diagnoses:  Right leg swelling  Anemia, unspecified type    ED Discharge Orders    None       Bethel Born, PA-C 10/10/18 1612    Gerhard Munch, MD 10/10/18 1623

## 2018-10-10 NOTE — Progress Notes (Signed)
*  PRELIMINARY RESULTS* Vascular Ultrasound Right lower extremity venous duplex has been completed.  Please see CV Proc tab for preliminary results.  Chauncey Fischer 10/10/2018, 12:20 PM

## 2018-10-17 ENCOUNTER — Encounter: Payer: Self-pay | Admitting: Cardiology

## 2018-10-17 ENCOUNTER — Ambulatory Visit (INDEPENDENT_AMBULATORY_CARE_PROVIDER_SITE_OTHER): Payer: Medicaid Other | Admitting: Cardiology

## 2018-10-17 VITALS — BP 130/60 | HR 116 | Ht 69.0 in | Wt 257.8 lb

## 2018-10-17 DIAGNOSIS — I4819 Other persistent atrial fibrillation: Secondary | ICD-10-CM | POA: Diagnosis not present

## 2018-10-17 DIAGNOSIS — D649 Anemia, unspecified: Secondary | ICD-10-CM

## 2018-10-17 DIAGNOSIS — I10 Essential (primary) hypertension: Secondary | ICD-10-CM | POA: Insufficient documentation

## 2018-10-17 DIAGNOSIS — M1711 Unilateral primary osteoarthritis, right knee: Secondary | ICD-10-CM

## 2018-10-17 HISTORY — DX: Essential (primary) hypertension: I10

## 2018-10-17 HISTORY — DX: Anemia, unspecified: D64.9

## 2018-10-17 NOTE — Progress Notes (Signed)
Cardiology Office Note:    Date:  10/17/2018   ID:  Peter Thompson, DOB June 05, 1966, MRN [id number]  PCP:  Quitman LivingsHassan, Sami, MD  Cardiologist:  Gypsy Balsamobert , MD    Referring MD: Quitman LivingsHassan, Sami, MD   Chief Complaint  Patient presents with  . Hospitalization Follow-up  I was recently in the hospital  History of Present Illness:    Peter Thompson is a 53 y.o. male with multiple pains 2 weeks ago he went to have right knee surgery.  Surgery was uneventful but day after surgery he flipped to atrial fibrillation he was seen by my partner in the hospital anticoagulation has been initiated and he was given labetalol to control his ventricular rate.  Since that time he seems to be doing well denies having any chest pain tightness squeezing pressure burning chest overall he does have a lot of pain in different portions of his body that being managed with long-term narcotics.  Described to have palpitations as well as fatigue.  He was referred to me right now to decide about potential cardioversion in terms of etiology for his atrial fibrillation I am worried he does have sleep apnea I will schedule him to have a sleep study.  We need to not a week or maybe 2 weeks of anticoagulation before attempting to cardiovert him.  His routine blood tests were checked including TSH.  His CBC was recently checked and he is anemic.  I will recheck his H&H today and based on that we decide about need to continue anticoagulation which he should because of recent knee surgery as well as atrial fibrillation.  His chads 2 Vascor is only 1.  Past Medical History:  Diagnosis Date  . Anxiety   . Arthritis   . Bursitis   . Complication of anesthesia    "woke up in OR- IV came out."  . Concussion    several- head injury- was in mil[facility]ary  . DJD (degenerative joint disease)   . Hypertension   . Insomnia   . Neck fracture (HCC)   . Tinnitus, bilateral     Past Surgical History:  Procedure Laterality Date  . ANKLE  SURGERY Left   . Arm surgery Right    fracture repair  . CERVICAL SPINE SURGERY     bone graft from left hip  . HAND SURGERY Left    drains for Infection  . HIP SURGERY Left    "scrapped" x2  . KNEE ARTHROSCOPY Right   . LEG SURGERY Left    "1 inch took out"  . SHOULDER ARTHROSCOPY Left 12/27/2016   Procedure: ARTHROSCOPY SHOULDER;  Surgeon: Marcene CorningPeter Dalldorf, MD;  [facility];  Service: Orthopedics;  Laterality: Left;  Debridement, AC Decompression, Acromioplasty   . TOTAL HIP ARTHROPLASTY Left 05/10/2016   Procedure: TOTAL HIP ARTHROPLASTY ANTERIOR APPROACH;  Surgeon: Marcene CorningPeter Dalldorf, MD;  Location: [facility] OR;  Service: Orthopedics;  Laterality: Left;  . TOTAL KNEE ARTHROPLASTY Right 10/02/2018   Procedure: TOTAL KNEE ARTHROPLASTY;  Surgeon: Marcene Corningalldorf, Peter, MD;  Location: [facility] OR;  Service: Orthopedics;  Laterality: Right;  . WRIST SURGERY Right    2 Pinns    Current Medications: Current Meds  Medication Sig  . amLODipine (NORVASC) 10 MG tablet Take 10 mg by mouth daily.  . cloNIDine (CATAPRES) 0.2 MG tablet Take 0.2 mg by mouth 3 (three) times daily.  . hydrALAZINE (APRESOLINE) 100 MG tablet Take 100 mg by mouth 3 (three) times daily.  Marland Kitchen. labetalol (NORMODYNE) 300 MG  tablet Take 1 tablet (300 mg total) by mouth 2 (two) times daily.  Marland Kitchen. lisinopril-hydrochlorothiazide (PRINZIDE,ZESTORETIC) 20-25 MG tablet Take 1 tablet by mouth daily.  . methocarbamol (ROBAXIN) 750 MG tablet Take 1 tablet (750 mg total) by mouth every 6 (six) hours as needed for muscle spasms.  . Multiple Vitamins-Minerals (MULTIVITAMIN WITH MINERALS) tablet Take 1 tablet by mouth daily.  Marland Kitchen. oxyCODONE 20 MG TABS Take 1 tablet (20 mg total) by mouth every 4 (four) hours as needed (severe pain). (Patient taking differently: Take 20 mg by mouth every 4 (four) hours. )  . potassium chloride (K-DUR) 10 MEQ tablet Take 10 mEq by mouth daily.  . rivaroxaban (XARELTO) 20 MG TABS tablet Take 1 tablet (20 mg total) by mouth daily with  supper.  . triamcinolone cream (KENALOG) 0.1 % Apply 1 application topically 2 (two) times daily.     Allergies:   Patient has no known allergies.   Social History   Socioeconomic History  . Marital status: Legally Separated    Spouse name: Not on file  . Number of children: Not on file  . Years of education: Not on file  . Highest education level: Not on file  Occupational History  . Not on file  Social Needs  . Financial resource strain: Not on file  . Food insecurity:    Worry: Not on file    Inability: Not on file  . Transportation needs:    Medical: Not on file    Non-medical: Not on file  Tobacco Use  . Smoking status: Current Every Day Smoker    Packs/day: 0.50    Years: 56.00    Pack years: 28.00    Types: Cigarettes  . Smokeless tobacco: Former NeurosurgeonUser    Types: Chew  . Tobacco comment: occasional - chew  Substance and Sexual Activity  . Alcohol use: Yes    Alcohol/week: 12.0 standard drinks    Types: 12 Cans of beer per week  . Drug use: No  . Sexual activity: Not on file  Lifestyle  . Physical activity:    Days per week: Not on file    Minutes per session: Not on file  . Stress: Not on file  Relationships  . Social connections:    Talks on phone: Not on file    Gets together: Not on file    Attends religious service: Not on file    Active member of club or organization: Not on file    Attends meetings of clubs or organizations: Not on file    Relationship status: Not on file  Other Topics Concern  . Not on file  Social History Narrative  . Not on file     Family History: The patient's family history is negative for Heart disease. ROS:   Please see the history of present illness.    All 14 point review of systems negative except as described per history of present illness  EKGs/Labs/Other Studies Reviewed:     Atrial fibrillation with ventricular rate of 99.,  Nonspecific ST segment changes  Recent Labs: 10/03/2018: Magnesium 2.0; TSH  0.495 10/10/2018: BUN 8; Creatinine, Ser 0.58; Hemoglobin 9.6; Platelets 327; Potassium 3.9; Sodium 128  Recent Lipid Panel No results found for: CHOL, TRIG, HDL, CHOLHDL, VLDL, LDLCALC, LDLDIRECT  Physical Exam:    VS:  BP 130/60   Pulse (!) 116   Ht 5\' 9"  (1.753 m)   Wt 257 lb 12.8 oz (116.9 kg)   SpO2 96%  BMI 38.07 kg/m     Wt Readings from Last 3 Encounters:  10/17/18 257 lb 12.8 oz (116.9 kg)  10/10/18 250 lb (113.4 kg)  10/02/18 250 lb (113.4 kg)     GEN:  Well nourished, well developed in no acute distress HEENT: Normal NECK: No JVD; No carotid bruits LYMPHATICS: No lymphadenopathy CARDIAC: RRR, no murmurs, no rubs, no gallops RESPIRATORY:  Clear to auscultation without rales, wheezing or rhonchi  ABDOMEN: Soft, non-tender, non-distended MUSCULOSKELETAL:  No edema; No deformity  SKIN: Warm and dry LOWER EXTREMITIES: no swelling NEUROLOGIC:  Alert and oriented x 3 PSYCHIATRIC:  Normal affect   ASSESSMENT:    1. Persistent atrial fibrillation   2. Primary osteoarthritis of right knee   3. Essential hypertension   4. Anemia, unspecified type    PLAN:    In order of problems listed above:  1. Persistent atrial fibrillation rate appears to be controlled symptomatic need to be converted required another week or 2 weeks of anticoagulation before scheduling him for elective cardioversion.  I will bring him back to the office in 10 days to check the rhythm if still in A. fib he will be scheduled to have cardioversion.  Echocardiogram done showed preserved ejection fraction mild LVH multiple enlargement of the left atrium. 2. Osteoarthritis status post knee surgery. 3. Essential hypertension blood pressure well controlled continue present management. 4. Anemia with latest H&H from 29 January was 9.6/27.8.  I will recheck his fasting lipid profile today   Medication Adjustments/Labs and Tests Ordered: Current medicines are reviewed at length with the patient today.   Concerns regarding medicines are outlined above.  No orders of the defined types were placed in this encounter.  Medication changes: No orders of the defined types were placed in this encounter.   Signed, Georgeanna Lea, MD, [facility] 10/17/2018 3:18 PM    [facility]

## 2018-10-17 NOTE — Patient Instructions (Signed)
Medication Instructions:  Your physician recommends that you continue on your current medications as directed. Please refer to the Current Medication list given to you today.  If you need a refill on your cardiac medications before your next appointment, please call your pharmacy.   Lab work: Your physician recommends that you return for lab work today: cbc  If you have labs (blood work) drawn today and your tests are completely normal, you will receive your results only by: Marland Kitchen MyChart Message (if you have MyChart) OR . A paper copy in the mail If you have any lab test that is abnormal or we need to change your treatment, we will call you to review the results.  Testing/Procedures: Your physician has recommended that you have a sleep study. This test records several body functions during sleep, including: brain activity, eye movement, oxygen and carbon dioxide blood levels, heart rate and rhythm, breathing rate and rhythm, the flow of air through your mouth and nose, snoring, body muscle movements, and chest and belly movement.    Follow-Up: At [facility], you and your health needs are our priority.  As part of our continuing mission to provide you with exceptional heart care, we have created designated Provider Care Teams.  These Care Teams include your primary Cardiologist (physician) and Advanced Practice Providers (APPs -  Physician Assistants and Nurse Practitioners) who all work together to provide you with the care you need, when you need it. You will need a follow up appointment in 10 days.  Please call our office 2 months in advance to schedule this appointment.  You may see Gypsy Balsam, MD or another member of our [facility] Provider Team in [place]: Norman Herrlich, MD . Belva Crome, MD  Any Other Special Instructions Will Be Listed Below (If Applicable).   Sleep Studies A sleep study (polysomnogram) is a series of tests done while you are sleeping. A sleep study  records your brain waves, heart rate, breathing rate, oxygen level, and eye and leg movements. A sleep study helps your health care provider:  See how well you sleep.  Diagnose a sleep disorder.  Determine how severe your sleep disorder is.  Create a plan to treat your sleep disorder. Your health care provider may recommend a sleep study if you:  Feel sleepy on most days.  Snore loudly while sleeping.  Have unusual behaviors while you sleep, such as walking.  Have brief periods in which you stop breathing during sleep (sleepapnea).  Fall asleep suddenly during the day (narcolepsy).  Have trouble falling asleep or staying asleep (insomnia).  Feel like you need to move your legs when trying to fall asleep (restless legs syndrome).  Move your legs by flexing and extending them regularly while asleep (periodic limb movement disorder).  Act out your dreams while you sleep (sleep behavior disorder).  Feel like you cannot move when you first wake up (sleep paralysis). What tests are part of a sleep study? Most sleep studies record the following during sleep:  Brain activity.  Eye movements.  Heart rate and rhythm.  Breathing rate and rhythm.  Blood-oxygen level.  Blood pressure.  Chest and belly movement as you breathe.  Arm and leg movements.  Snoring or other noises.  Body position. Where are sleep studies done? Sleep studies are done at sleep centers. A sleep center may be inside a hospital, office, or clinic. The room where you have the study may look like a hospital room or a hotel room. The  health care providers doing the study may come in and out of the room during the study. Most of the time, they will be in another room monitoring your test as you sleep. How are sleep studies done? Most sleep studies are done during a normal period of time for a full night of sleep. You will arrive at the study center in the evening and go home in the morning. Before the  test  Bring your pajamas and toothbrush with you to the sleep study.  Do not have caffeine on the day of your sleep study.  Do not drink alcohol on the day of your sleep study.  Your health care provider will let you know if you should stop taking any of your regular medicines before the test. During the test      Round, sticky patches with sensors attached to recording wires (electrodes) are placed on your scalp, face, chest, and limbs.  Wires from all the electrodes and sensors run from your bed to a computer. The wires can be taken off and put back on if you need to get out of bed to go to the bathroom.  A sensor is placed over your nose to measure airflow.  A finger clip is put on your finger or ear to measure your blood oxygen level (pulse oximetry).  A belt is placed around your belly and a belt is placed around your chest to measure breathing movements.  If you have signs of the sleep disorder called sleep apnea during your test, you may get a treatment mask to wear for the second half of the night. ? The mask provides positive airway pressure (PAP) to help you breathe better during sleep. This may greatly improve your sleep apnea. ? You will then have all tests done again with the mask in place to see if your measurements and recordings change. After the test  A medical doctor who specializes in sleep will evaluate the results of your sleep study and share them with you and your primary health care provider.  Based on your results, your medical history, and a physical exam, you may be diagnosed with a sleep disorder, such as: ? Sleep apnea. ? Restless legs syndrome. ? Sleep-related behavior disorder. ? Sleep-related movement disorders. ? Sleep-related seizure disorders.  Your health care team will help determine your treatment options based on your diagnosis. This may include: ? Improving your sleep habits (sleep hygiene). ? Wearing a continuous positive airway  pressure (CPAP) or bi-level positive airway pressure (BPAP) mask. ? Wearing an oral device at night to improve breathing and reduce snoring. ? Taking medicines. Follow these instructions at home:  Take over-the-counter and prescription medicines only as told by your health care provider.  If you are instructed to use a CPAP or BPAP mask, make sure you use it nightly as directed.  Make any lifestyle changes that your health care provider recommends.  If you were given a device to open your airway while you sleep, use it only as told by your health care provider.  Do not use any tobacco products, such as cigarettes, chewing tobacco, and e-cigarettes. If you need help quitting, ask your health care provider.  Keep all follow-up visits as told by your health care provider. This is important. Summary  A sleep study (polysomnogram) is a series of tests done while you are sleeping. It shows how well you sleep.  Most sleep studies are done over one full night of sleep. You will arrive  at the study center in the evening and go home in the morning.  If you have signs of the sleep disorder called sleep apnea during your test, you may get a treatment mask to wear for the second half of the night.  A medical doctor who specializes in sleep will evaluate the results of your sleep study and share them with your primary health care provider. This information is not intended to replace advice given to you by your health care provider. Make sure you discuss any questions you have with your health care provider. Document Released: 03/05/2003 Document Revised: 09/26/2017 Document Reviewed: 09/26/2017 Elsevier Interactive Patient Education  [employer].

## 2018-10-18 ENCOUNTER — Telehealth: Payer: Self-pay | Admitting: *Deleted

## 2018-10-18 LAB — CBC
Hematocrit: 28.9 % — ABNORMAL LOW (ref 37.5–51.0)
Hemoglobin: 9.8 g/dL — ABNORMAL LOW (ref 13.0–17.7)
MCH: 31.2 pg (ref 26.6–33.0)
MCHC: 33.9 g/dL (ref 31.5–35.7)
MCV: 92 fL (ref 79–97)
Platelets: 404 10*3/uL (ref 150–450)
RBC: 3.14 x10E6/uL — ABNORMAL LOW (ref 4.14–5.80)
RDW: 12.9 % (ref 11.6–15.4)
WBC: 7.6 10*3/uL (ref 3.4–10.8)

## 2018-10-18 NOTE — Telephone Encounter (Signed)
-----   Message from Lita Mains, RN sent at 10/17/2018  3:28 PM EST ----- Regarding: Please precert Please precert sleep study for atrial fibrillation per Dr. Bing Matter.    Thanks  Mayme Genta, RN

## 2018-10-18 NOTE — Telephone Encounter (Signed)
[facility] no [facility]ert required for [facility]. Ok to schedule sleep study.

## 2018-10-19 ENCOUNTER — Telehealth: Payer: Self-pay | Admitting: Emergency Medicine

## 2018-10-19 NOTE — Telephone Encounter (Signed)
Left message for patient to return call regarding sleep study appointment March 30th at 8 pm

## 2018-10-19 NOTE — Telephone Encounter (Signed)
Patient informed of appointment

## 2018-10-26 ENCOUNTER — Encounter: Payer: Self-pay | Admitting: Cardiology

## 2018-10-26 ENCOUNTER — Ambulatory Visit (INDEPENDENT_AMBULATORY_CARE_PROVIDER_SITE_OTHER): Payer: Medicaid Other | Admitting: Cardiology

## 2018-10-26 VITALS — BP 118/70 | HR 62 | Ht 69.0 in | Wt 250.8 lb

## 2018-10-26 DIAGNOSIS — I10 Essential (primary) hypertension: Secondary | ICD-10-CM

## 2018-10-26 DIAGNOSIS — I4819 Other persistent atrial fibrillation: Secondary | ICD-10-CM

## 2018-10-26 DIAGNOSIS — D649 Anemia, unspecified: Secondary | ICD-10-CM

## 2018-10-26 NOTE — Progress Notes (Signed)
Cardiology Office Note:    Date:  10/26/2018   ID:  Peter CrimesScott C Jarnagin, DOB 1966/02/17, MRN [id number]  PCP:  Quitman LivingsHassan, Sami, MD  Cardiologist:  Gypsy Balsamobert Krasowski, MD    Referring MD: Quitman LivingsHassan, Sami, MD   Chief Complaint  Patient presents with  . 10 day follow up  Still having a lot of pain in my leg  History of Present Illness:    Peter Thompson is a 53 y.o. male with persistent atrial fibrillation last time I see him about 10 days ago he was still in atrial fibrillation but he did not complete 3 weeks of anticoagulation therefore we delayed cardioversion today he comes for follow-up he sounds very regular we will do EKG to confirm the rhythm I suspect he is back to normal sinus rhythm he is scheduled to have a sleep study which will be very beneficial to him I will maintain him on anticoagulation for at least now.  Past Medical History:  Diagnosis Date  . Anxiety   . Arthritis   . Bursitis   . Complication of anesthesia    "woke up in OR- IV came out."  . Concussion    several- head injury- was in milatary  . DJD (degenerative joint disease)   . Hypertension   . Insomnia   . Neck fracture (HCC)   . Tinnitus, bilateral     Past Surgical History:  Procedure Laterality Date  . ANKLE SURGERY Left   . Arm surgery Right    fracture repair  . CERVICAL SPINE SURGERY     bone graft from left hip  . HAND SURGERY Left    drains for Infection  . HIP SURGERY Left    "scrapped" x2  . KNEE ARTHROSCOPY Right   . LEG SURGERY Left    "1 inch took out"  . SHOULDER ARTHROSCOPY Left 12/27/2016   Procedure: ARTHROSCOPY SHOULDER;  Surgeon: Marcene CorningPeter Dalldorf, MD;  [facility];  Service: Orthopedics;  Laterality: Left;  Debridement, AC Decompression, Acromioplasty   . TOTAL HIP ARTHROPLASTY Left 05/10/2016   Procedure: TOTAL HIP ARTHROPLASTY ANTERIOR APPROACH;  Surgeon: Marcene CorningPeter Dalldorf, MD;  Location: [facility] OR;  Service: Orthopedics;  Laterality: Left;  . TOTAL KNEE ARTHROPLASTY Right 10/02/2018    Procedure: TOTAL KNEE ARTHROPLASTY;  Surgeon: Marcene Corningalldorf, Peter, MD;  Location: [facility] OR;  Service: Orthopedics;  Laterality: Right;  . WRIST SURGERY Right    2 Pinns    Current Medications: Current Meds  Medication Sig  . amLODipine (NORVASC) 10 MG tablet Take 10 mg by mouth daily.  . cloNIDine (CATAPRES) 0.2 MG tablet Take 0.2 mg by mouth 3 (three) times daily.  . hydrALAZINE (APRESOLINE) 100 MG tablet Take 100 mg by mouth 3 (three) times daily.  Marland Kitchen. labetalol (NORMODYNE) 300 MG tablet Take 1 tablet (300 mg total) by mouth 2 (two) times daily.  Marland Kitchen. lisinopril-hydrochlorothiazide (PRINZIDE,ZESTORETIC) 20-25 MG tablet Take 1 tablet by mouth daily.  . methocarbamol (ROBAXIN) 750 MG tablet Take 1 tablet (750 mg total) by mouth every 6 (six) hours as needed for muscle spasms.  . Multiple Vitamins-Minerals (MULTIVITAMIN WITH MINERALS) tablet Take 1 tablet by mouth daily.  Marland Kitchen. oxyCODONE 20 MG TABS Take 1 tablet (20 mg total) by mouth every 4 (four) hours as needed (severe pain). (Patient taking differently: Take 20 mg by mouth every 4 (four) hours. )  . potassium chloride (K-DUR) 10 MEQ tablet Take 10 mEq by mouth daily.  . rivaroxaban (XARELTO) 20 MG TABS tablet Take 1 tablet (  20 mg total) by mouth daily with supper.  . triamcinolone cream (KENALOG) 0.1 % Apply 1 application topically 2 (two) times daily.     Allergies:   Patient has no known allergies.   Social History   Socioeconomic History  . Marital status: Legally Separated    Spouse name: Not on file  . Number of children: Not on file  . Years of education: Not on file  . Highest education level: Not on file  Occupational History  . Not on file  Social Needs  . Financial resource strain: Not on file  . Food insecurity:    Worry: Not on file    Inability: Not on file  . Transportation needs:    Medical: Not on file    Non-medical: Not on file  Tobacco Use  . Smoking status: Current Every Day Smoker    Packs/day: 0.50    Years:  56.00    Pack years: 28.00    Types: Cigarettes  . Smokeless tobacco: Former Neurosurgeon    Types: Chew  . Tobacco comment: occasional - chew  Substance and Sexual Activity  . Alcohol use: Yes    Alcohol/week: 12.0 standard drinks    Types: 12 Cans of beer per week  . Drug use: No  . Sexual activity: Not on file  Lifestyle  . Physical activity:    Days per week: Not on file    Minutes per session: Not on file  . Stress: Not on file  Relationships  . Social connections:    Talks on phone: Not on file    Gets together: Not on file    Attends religious service: Not on file    Active member of club or organization: Not on file    Attends meetings of clubs or organizations: Not on file    Relationship status: Not on file  Other Topics Concern  . Not on file  Social History Narrative  . Not on file     Family History: The patient's family history is negative for Heart disease. ROS:   Please see the history of present illness.    All 14 point review of systems negative except as described per history of present illness  EKGs/Labs/Other Studies Reviewed:    EKG showed normal sinus rhythm first-degree AV block normal QRS complex duration morphology nonspecific ST segment changes  Recent Labs: 10/03/2018: Magnesium 2.0; TSH 0.495 10/10/2018: BUN 8; Creatinine, Ser 0.58; Potassium 3.9; Sodium 128 10/17/2018: Hemoglobin 9.8; Platelets 404  Recent Lipid Panel No results found for: CHOL, TRIG, HDL, CHOLHDL, VLDL, LDLCALC, LDLDIRECT  Physical Exam:    VS:  BP 118/70   Pulse 62   Ht 5\' 9"  (1.753 m)   Wt 250 lb 12.8 oz (113.8 kg)   SpO2 96%   BMI 37.04 kg/m     Wt Readings from Last 3 Encounters:  10/26/18 250 lb 12.8 oz (113.8 kg)  10/17/18 257 lb 12.8 oz (116.9 kg)  10/10/18 250 lb (113.4 kg)     GEN:  Well nourished, well developed in no acute distress HEENT: Normal NECK: No JVD; No carotid bruits LYMPHATICS: No lymphadenopathy CARDIAC: RRR, no murmurs, no rubs, no  gallops RESPIRATORY:  Clear to auscultation without rales, wheezing or rhonchi  ABDOMEN: Soft, non-tender, non-distended MUSCULOSKELETAL:  No edema; No deformity  SKIN: Warm and dry LOWER EXTREMITIES: no swelling NEUROLOGIC:  Alert and oriented x 3 PSYCHIATRIC:  Normal affect   ASSESSMENT:    1. Persistent atrial fibrillation  2. Essential hypertension   3. Anemia, unspecified type    PLAN:    In order of problems listed above:  1. Paroxysmal atrial fibrillation suspect he is in sinus rhythm we will do EKG to confirm the rhythm.  We will continue with anticoagulation for now. 2. KG confirms sinus rhythm.  We will continue anticoagulation present management sleep study has been scheduled 3. Essential hypertension blood pressure well controlled continue present management. 4. Anemia last check was stable. 5. Potential obstructive sleep apnea awaiting results of his sleep study.   Medication Adjustments/Labs and Tests Ordered: Current medicines are reviewed at length with the patient today.  Concerns regarding medicines are outlined above.  No orders of the defined types were placed in this encounter.  Medication changes: No orders of the defined types were placed in this encounter.   Signed, Georgeanna Lea, MD, [facility] 10/26/2018 3:50 PM    [facility]

## 2018-10-26 NOTE — Patient Instructions (Signed)
Medication Instructions:  Your physician recommends that you continue on your current medications as directed. Please refer to the Current Medication list given to you today.  If you need a refill on your cardiac medications before your next appointment, please call your pharmacy.   Lab work: None.  If you have labs (blood work) drawn today and your tests are completely normal, you will receive your results only by: . MyChart Message (if you have MyChart) OR . A paper copy in the mail If you have any lab test that is abnormal or we need to change your treatment, we will call you to review the results.  Testing/Procedures: None.   Follow-Up: At [facility], you and your health needs are our priority.  As part of our continuing mission to provide you with exceptional heart care, we have created designated Provider Care Teams.  These Care Teams include your primary Cardiologist (physician) and Advanced Practice Providers (APPs -  Physician Assistants and Nurse Practitioners) who all work together to provide you with the care you need, when you need it. You will need a follow up appointment in 2 months.  Please call our office 2 months in advance to schedule this appointment.  You may see Robert Krasowski, MD or another member of our [facility] Provider Team in [place]: Brian Munley, MD . Rajan Revankar, MD  Any Other Special Instructions Will Be Listed Below (If Applicable).     

## 2018-11-05 ENCOUNTER — Telehealth: Payer: Self-pay | Admitting: Cardiology

## 2018-11-05 MED ORDER — RIVAROXABAN 20 MG PO TABS: 20.0000 mg | ORAL_TABLET | Freq: Every day | ORAL | 0 refills | Status: DC

## 2018-11-05 NOTE — Telephone Encounter (Signed)
Xarelto 20 mg daily refilled per Dr. Krasowski.  

## 2018-11-05 NOTE — Telephone Encounter (Signed)
Patient states that he has been taking blood thinner that his knee dr called in for him and now he is out and Dr Kirtland Bouchard told him to contniue.. will Dr Kirtland Bouchard call in for him.[facility] is the pharmacuy.

## 2018-11-05 NOTE — Telephone Encounter (Signed)
Called patient back he reports he needs a refill on xarelto 20 mg daily I will confirm with Dr. Bing Matter and send in if he approves.

## 2018-12-10 ENCOUNTER — Encounter (HOSPITAL_BASED_OUTPATIENT_CLINIC_OR_DEPARTMENT_OTHER): Payer: Medicaid Other

## 2018-12-19 ENCOUNTER — Telehealth: Payer: Self-pay

## 2018-12-19 NOTE — Telephone Encounter (Signed)
Called and left voicemail to the patient to the office. Need to talk about changing his appointment on 4/15 at 4 pm to a televisit.

## 2018-12-24 NOTE — Telephone Encounter (Signed)
Left another message for patient to call back regarding switching appt to a televisit.

## 2018-12-25 ENCOUNTER — Telehealth: Payer: Self-pay | Admitting: Cardiology

## 2018-12-25 NOTE — Telephone Encounter (Signed)
TELEPHONE CALL NOTE  Peter CrimesScott C Thompson has been deemed a candidate for a follow-up tele-health visit to limit community exposure during the Covid-19 pandemic. I spoke with the patient via phone to ensure availability of phone/video source, confirm preferred email & phone number, and discuss instructions and expectations.  I reminded Peter Thompson to be prepared with any vital sign and/or heart rhythm information that could potentially be obtained via home monitoring, at the time of his visit. I reminded Peter Thompson to expect a phone call at the time of his visit if his visit.  Peter ParrDenise Thompson 12/25/2018 1:22 PM   FULL LENGTH CONSENT FOR TELE-HEALTH VISIT   I hereby voluntarily request, consent and authorize [facility] and its employed or contracted physicians, physician assistants, nurse practitioners or other licensed health care professionals (the Practitioner), to provide me with telemedicine health care services (the Services") as deemed necessary by the treating Practitioner. I acknowledge and consent to receive the Services by the Practitioner via telemedicine. I understand that the telemedicine visit will involve communicating with the Practitioner through live audiovisual communication technology and the disclosure of certain medical information by electronic transmission. I acknowledge that I have been given the opportunity to request an in-person assessment or other available alternative prior to the telemedicine visit and am voluntarily participating in the telemedicine visit.  I understand that I have the right to withhold or withdraw my consent to the use of telemedicine in the course of my care at any time, without affecting my right to future care or treatment, and that the Practitioner or I may terminate the telemedicine visit at any time. I understand that I have the right to inspect all information obtained and/or recorded in the course of the telemedicine visit  and may receive copies of available information for a reasonable fee.  I understand that some of the potential risks of receiving the Services via telemedicine include:   Delay or interruption in medical evaluation due to technological equipment failure or disruption;  Information transmitted may not be sufficient (e.g. poor resolution of images) to allow for appropriate medical decision making by the Practitioner; and/or   In rare instances, security protocols could fail, causing a breach of personal health information.  Furthermore, I acknowledge that it is my responsibility to provide information about my medical history, conditions and care that is complete and accurate to the best of my ability. I acknowledge that Practitioner's advice, recommendations, and/or decision may be based on factors not within their control, such as incomplete or inaccurate data provided by me or distortions of diagnostic images or specimens that may result from electronic transmissions. I understand that the practice of medicine is not an exact science and that Practitioner makes no warranties or guarantees regarding treatment outcomes. I acknowledge that I will receive a copy of this consent concurrently upon execution via email to the email address I last provided but may also request a printed copy by calling the office of [facility].    I understand that my insurance will be billed for this visit.   I have read or had this consent read to me.  I understand the contents of this consent, which adequately explains the benefits and risks of the Services being provided via telemedicine.   I have been provided ample opportunity to ask questions regarding this consent and the Services and have had my questions answered to my satisfaction.  I give my informed consent for the  services to be provided through the use of telemedicine in my medical care  By participating in this telemedicine visit I agree to the above.   yes

## 2018-12-26 ENCOUNTER — Other Ambulatory Visit: Payer: Self-pay

## 2018-12-26 ENCOUNTER — Telehealth (INDEPENDENT_AMBULATORY_CARE_PROVIDER_SITE_OTHER): Payer: Medicaid Other | Admitting: Cardiology

## 2018-12-26 ENCOUNTER — Encounter: Payer: Self-pay | Admitting: Cardiology

## 2018-12-26 VITALS — Wt 252.4 lb

## 2018-12-26 DIAGNOSIS — M1711 Unilateral primary osteoarthritis, right knee: Secondary | ICD-10-CM

## 2018-12-26 DIAGNOSIS — I480 Paroxysmal atrial fibrillation: Secondary | ICD-10-CM

## 2018-12-26 DIAGNOSIS — I10 Essential (primary) hypertension: Secondary | ICD-10-CM | POA: Diagnosis not present

## 2018-12-26 HISTORY — DX: Paroxysmal atrial fibrillation: I48.0

## 2018-12-26 MED ORDER — RIVAROXABAN 20 MG PO TABS: 20.0000 mg | ORAL_TABLET | Freq: Every day | ORAL | 2 refills | Status: DC

## 2018-12-26 MED ORDER — METOPROLOL SUCCINATE ER 25 MG PO TB24: 25.0000 mg | ORAL_TABLET | Freq: Every day | ORAL | 1 refills | Status: DC

## 2018-12-26 NOTE — Addendum Note (Signed)
Addended by: Lita Mains on: 12/26/2018 04:32 PM   Modules accepted: Orders

## 2018-12-26 NOTE — Progress Notes (Signed)
Virtual Visit via Video Note   This visit type was conducted due to national recommendations for restrictions regarding the COVID-19 Pandemic (e.g. social distancing) in an effort to limit this patient's exposure and mitigate transmission in our community.  Due to his co-morbid illnesses, this patient is at least at moderate risk for complications without adequate follow up.  This format is felt to be most appropriate for this patient at this time.  All issues noted in this document were discussed and addressed.  A limited physical exam was performed with this format.  Please refer to the patient's chart for his consent to telehealth [facility].  Evaluation Performed:  Follow-up visit  This visit type was conducted due to national recommendations for restrictions regarding the COVID-19 Pandemic (e.g. social distancing).  This format is felt to be most appropriate for this patient at this time.  All issues noted in this document were discussed and addressed.  No physical exam was performed (except for noted visual exam findings with Video Visits).  Please refer to the patient's chart (MyChart message for video visits and phone note for telephone visits) for the patient's consent to telehealth for [facility].  Date:  12/26/2018  ID: Peter Thompson, DOB Dec 29, 1965, MRN [id number]   Patient Location:  [address] [place] [id number]   Provider location[facility]   [facility] [place] [facility]  PCP:  Quitman LivingsHassan, Sami, MD  Cardiologist:  Gypsy Balsamobert , MD     Chief Complaint: I had 2 episode of palpitations since last time  History of Present Illness:    Peter CrimesScott C Thompson is a 53 y.o. male  who presents via audio/video conferencing for a telehealth visit today.  With paroxysmal atrial fibrillation he got episode of atrial fibrillation that he converted spontaneously.  His chads 2 Vascor equals 1.  He is anticoagulated the goal was to discontinue anticoagulation if he has no  recurrences of atrial fibrillation.  Sadly from what he described he still have recurrences of atrial fibrillation.  I will add small dose of beta-blocker metoprolol 25 mg to his medical regimen we will continue present management I talked to him again in about a month.   The patient does not have symptoms concerning for COVID-19 infection (fever, chills, cough, or new SHORTNESS OF BREATH).    Prior CV studies:   The following studies were reviewed today:       Past Medical History:  Diagnosis Date  . Anxiety   . Arthritis   . Bursitis   . Complication of anesthesia    "woke up in OR- IV came out."  . Concussion    several- head injury- was in milatary  . DJD (degenerative joint disease)   . Hypertension   . Insomnia   . Neck fracture (HCC)   . Tinnitus, bilateral     Past Surgical History:  Procedure Laterality Date  . ANKLE SURGERY Left   . Arm surgery Right    fracture repair  . CERVICAL SPINE SURGERY     bone graft from left hip  . HAND SURGERY Left    drains for Infection  . HIP SURGERY Left    "scrapped" x2  . KNEE ARTHROSCOPY Right   . LEG SURGERY Left    "1 inch took out"  . SHOULDER ARTHROSCOPY Left 12/27/2016   Procedure: ARTHROSCOPY SHOULDER;  Surgeon: Marcene CorningPeter Dalldorf, MD;  [facility];  Service: Orthopedics;  Laterality: Left;  Debridement, AC Decompression, Acromioplasty   .  TOTAL HIP ARTHROPLASTY Left 05/10/2016   Procedure: TOTAL HIP ARTHROPLASTY ANTERIOR APPROACH;  Surgeon: Marcene Corning, MD;  Location: [facility] OR;  Service: Orthopedics;  Laterality: Left;  . TOTAL KNEE ARTHROPLASTY Right 10/02/2018   Procedure: TOTAL KNEE ARTHROPLASTY;  Surgeon: Marcene Corning, MD;  Location: [facility] OR;  Service: Orthopedics;  Laterality: Right;  . WRIST SURGERY Right    2 Pinns     Current Meds  Medication Sig  . amLODipine (NORVASC) 10 MG tablet Take 10 mg by mouth daily.  . cloNIDine (CATAPRES) 0.2 MG tablet Take 0.2 mg by mouth 3 (three) times daily.  .  hydrALAZINE (APRESOLINE) 100 MG tablet Take 100 mg by mouth 3 (three) times daily.  Marland Kitchen labetalol (NORMODYNE) 300 MG tablet Take 1 tablet (300 mg total) by mouth 2 (two) times daily.  Marland Kitchen lisinopril-hydrochlorothiazide (PRINZIDE,ZESTORETIC) 20-25 MG tablet Take 1 tablet by mouth daily.  . methocarbamol (ROBAXIN) 750 MG tablet Take 1 tablet (750 mg total) by mouth every 6 (six) hours as needed for muscle spasms.  . Multiple Vitamins-Minerals (MULTIVITAMIN WITH MINERALS) tablet Take 1 tablet by mouth daily.  Marland Kitchen oxyCODONE 20 MG TABS Take 1 tablet (20 mg total) by mouth every 4 (four) hours as needed (severe pain). (Patient taking differently: Take 20 mg by mouth every 4 (four) hours. )  . potassium chloride (K-DUR) 10 MEQ tablet Take 10 mEq by mouth daily.  . rivaroxaban (XARELTO) 20 MG TABS tablet Take 1 tablet (20 mg total) by mouth daily with supper.  . triamcinolone cream (KENALOG) 0.1 % Apply 1 application topically 2 (two) times daily.      Family History: The patient's family history is negative for Heart disease.   ROS:   Please see the history of present illness.     All other systems reviewed and are negative.   Labs/Other Tests and Data Reviewed:     Recent Labs: 10/03/2018: Magnesium 2.0; TSH 0.495 10/10/2018: BUN 8; Creatinine, Ser 0.58; Potassium 3.9; Sodium 128 10/17/2018: Hemoglobin 9.8; Platelets 404  Recent Lipid Panel No results found for: CHOL, TRIG, HDL, CHOLHDL, VLDL, LDLCALC, LDLDIRECT    Exam:    Vital Signs:  Wt 252 lb 6.4 oz (114.5 kg)   BMI 37.27 kg/m     Wt Readings from Last 3 Encounters:  12/26/18 252 lb 6.4 oz (114.5 kg)  10/26/18 250 lb 12.8 oz (113.8 kg)  10/17/18 257 lb 12.8 oz (116.9 kg)     Well nourished, well developed male in no acute distress. Graduated x3.  Quite cheerful and happy to see me he walks in his trailer he showing me outside his house he is shooting range.  No JVD no swelling of lower extremities  Diagnosis for this visit:    1. Essential hypertension   2. Paroxysmal atrial fibrillation (HCC)   3. Primary osteoarthritis of right knee      ASSESSMENT & PLAN:    1.  Essential hypertension blood pressure well controlled continue present medications. 2.  Paroxysmal atrial fibrillation addition of metoprolol will be started.  I will continue with anticoagulation I talked to him in a month. 3.  Osteoarthritis.  Apparently better he does have appointment with orthopedic doctor however this been postponed because of coronavirus situation.  COVID-19 Education: The signs and symptoms of COVID-19 were discussed with the patient and how to seek care for testing (follow up with PCP or arrange E-visit).  The importance of social distancing was discussed today.  Patient Risk:   After full  review of this patients clinical status, I feel that they are at least moderate risk at this time.  Time:   Today, I have spent 16 minutes with the patient with telehealth technology discussing pt health issues. Visit was finished at 4:15 PM.    Medication Adjustments/Labs and Tests Ordered: Current medicines are reviewed at length with the patient today.  Concerns regarding medicines are outlined above.  No orders of the defined types were placed in this encounter.  Medication changes: No orders of the defined types were placed in this encounter.    Disposition: 1 month follow-up, will add metoprolol succinate 25 mg daily  Signed, Georgeanna Lea, MD, [facility] 12/26/2018 4:17 PM    [facility]

## 2018-12-26 NOTE — Patient Instructions (Signed)
Medication Instructions:  Your physician has recommended you make the following change in your medication:   Start: Metoprolol succinate 25 mg daily.  If you need a refill on your cardiac medications before your next appointment, please call your pharmacy.   Lab work: None.  If you have labs (blood work) drawn today and your tests are completely normal, you will receive your results only by: . MyChart Message (if you have MyChart) OR . A paper copy in the mail If you have any lab test that is abnormal or we need to change your treatment, we will call you to review the results.  Testing/Procedures: None.   Follow-Up: At [facility], you and your health needs are our priority.  As part of our continuing mission to provide you with exceptional heart care, we have created designated Provider Care Teams.  These Care Teams include your primary Cardiologist (physician) and Advanced Practice Providers (APPs -  Physician Assistants and Nurse Practitioners) who all work together to provide you with the care you need, when you need it. You will need a follow up appointment in 1 months.  Please call our office 2 months in advance to schedule this appointment.  You may see Robert Krasowski, MD or another member of our [facility] Provider Team in [place]: Brian Munley, MD . Rajan Revankar, MD  Any Other Special Instructions Will Be Listed Below (If Applicable).  Metoprolol extended-release tablets What is this medicine? METOPROLOL (me TOE proe lole) is a beta-blocker. Beta-blockers reduce the workload on the heart and help it to beat more regularly. This medicine is used to treat high blood pressure and to prevent chest pain. It is also used to after a heart attack and to prevent an additional heart attack from occurring. This medicine may be used for other purposes; ask your health care provider or pharmacist if you have questions. COMMON BRAND NAME(S): toprol, Toprol XL What should I tell  my health care provider before I take this medicine? They need to know if you have any of these conditions: -diabetes -heart or vessel disease like slow heart rate, worsening heart failure, heart block, sick sinus syndrome or Raynaud's disease -kidney disease -liver disease -lung or breathing disease, like asthma or emphysema -pheochromocytoma -thyroid disease -an unusual or allergic reaction to metoprolol, other beta-blockers, medicines, foods, dyes, or preservatives -pregnant or trying to get pregnant -breast-feeding How should I use this medicine? Take this medicine by mouth with a glass of water. Follow the directions on the prescription label. Do not crush or chew. Take this medicine with or immediately after meals. Take your doses at regular intervals. Do not take more medicine than directed. Do not stop taking this medicine suddenly. This could lead to serious heart-related effects. Talk to your pediatrician regarding the use of this medicine in children. While this drug may be prescribed for children as young as 6 years for selected conditions, precautions do apply. Overdosage: If you think you have taken too much of this medicine contact a poison control center or emergency room at once. NOTE: This medicine is only for you. Do not share this medicine with others. What if I miss a dose? If you miss a dose, take it as soon as you can. If it is almost time for your next dose, take only that dose. Do not take double or extra doses. What may interact with this medicine? This medicine may interact with the following medications: -certain medicines for blood pressure, heart disease, irregular heart   beat -certain medicines for depression, like monoamine oxidase (MAO) inhibitors, fluoxetine, or paroxetine -clonidine -dobutamine -epinephrine -isoproterenol -reserpine This list may not describe all possible interactions. Give your health care provider a list of all the medicines, herbs,  non-prescription drugs, or dietary supplements you use. Also tell them if you smoke, drink alcohol, or use illegal drugs. Some items may interact with your medicine. What should I watch for while using this medicine? Visit your doctor or health care professional for regular check ups. Contact your doctor right away if your symptoms worsen. Check your blood pressure and pulse rate regularly. Ask your health care professional what your blood pressure and pulse rate should be, and when you should contact them. You may get drowsy or dizzy. Do not drive, use machinery, or do anything that needs mental alertness until you know how this medicine affects you. Do not sit or stand up quickly, especially if you are an older patient. This reduces the risk of dizzy or fainting spells. Contact your doctor if these symptoms continue. Alcohol may interfere with the effect of this medicine. Avoid alcoholic drinks. What side effects may I notice from receiving this medicine? Side effects that you should report to your doctor or health care professional as soon as possible: -allergic reactions like skin rash, itching or hives -cold or numb hands or feet -depression -difficulty breathing -faint -fever with sore throat -irregular heartbeat, chest pain -rapid weight gain -swollen legs or ankles Side effects that usually do not require medical attention (report to your doctor or health care professional if they continue or are bothersome): -anxiety or nervousness -change in sex drive or performance -dry skin -headache -nightmares or trouble sleeping -short term memory loss -stomach upset or diarrhea -unusually tired This list may not describe all possible side effects. Call your doctor for medical advice about side effects. You may report side effects to FDA at 1-800-FDA-1088. Where should I keep my medicine? Keep out of the reach of children. Store at room temperature between 15 and 30 degrees C (59 and 86  degrees F). Throw away any unused medicine after the expiration date. NOTE: This sheet is a summary. It may not cover all possible information. If you have questions about this medicine, talk to your doctor, pharmacist, or health care provider.  2019 Elsevier/Gold Standard (2013-05-03 14:41:37)    

## 2019-01-15 ENCOUNTER — Telehealth: Payer: Self-pay | Admitting: *Deleted

## 2019-01-15 NOTE — Telephone Encounter (Signed)
Please call [phone]

## 2019-01-15 NOTE — Telephone Encounter (Signed)
Pt is having a lot of neck pain due to old injury and need to know what to take for pain since on blood thinner and you told him not to take Aspirin. Please advise.

## 2019-01-16 NOTE — Telephone Encounter (Signed)
Called patient he reports he was needed something else to relieve his neck pain from and old injury. Patient advised not to take aspirin or any NSAID. Patient informed that he could use otc tylenol as long as other medications didn't have tylenol in them already. Patient verbally understands.

## 2019-01-28 ENCOUNTER — Telehealth: Payer: Self-pay | Admitting: *Deleted

## 2019-01-28 NOTE — Telephone Encounter (Signed)
Pt went to [facility] this am with nose bleed. Took him off the xarelto for 2 days. Will see ENT on 5/22 to have nasal rocket removed. 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 Please advise.

## 2019-01-28 NOTE — Telephone Encounter (Signed)
That is strange,  I am not sure what to make up of that  He may needs to call PMD on advice

## 2019-01-28 NOTE — Telephone Encounter (Signed)
Left message for patient to return call.

## 2019-01-29 NOTE — Telephone Encounter (Signed)
Left message for patient to return call.

## 2019-01-29 NOTE — Telephone Encounter (Signed)
Informed patient that Dr. Bing Matter advised him to follow up with PCP. Patient saw him this morning and he was referred to a specialist who he is seeing on Friday. He is still off xarelto and would like to know if Dr. Bing Matter is advising him stay off of it for now? I will consult with Dr. Bing Matter.

## 2019-01-29 NOTE — Telephone Encounter (Signed)
Should restarted Xarelto if no more bleeding

## 2019-01-30 ENCOUNTER — Telehealth: Payer: Medicaid Other | Admitting: Cardiology

## 2019-01-30 ENCOUNTER — Other Ambulatory Visit: Payer: Self-pay

## 2019-01-30 DIAGNOSIS — R040 Epistaxis: Secondary | ICD-10-CM | POA: Insufficient documentation

## 2019-01-30 HISTORY — DX: Epistaxis: R04.0

## 2019-01-30 NOTE — Telephone Encounter (Signed)
Left message for patient to return call.

## 2019-02-01 NOTE — Telephone Encounter (Signed)
Called patient informed him he can start taking xarelto if bleeding has subsided patient verbally understands. During the call I also rescheduled patient for a f/u appt next week with Dr. Bing Matter.

## 2019-02-07 ENCOUNTER — Other Ambulatory Visit: Payer: Self-pay

## 2019-02-07 ENCOUNTER — Encounter: Payer: Self-pay | Admitting: Cardiology

## 2019-02-07 ENCOUNTER — Telehealth (INDEPENDENT_AMBULATORY_CARE_PROVIDER_SITE_OTHER): Payer: Medicaid Other | Admitting: Cardiology

## 2019-02-07 VITALS — BP 152/86 | HR 87

## 2019-02-07 DIAGNOSIS — I480 Paroxysmal atrial fibrillation: Secondary | ICD-10-CM

## 2019-02-07 DIAGNOSIS — I10 Essential (primary) hypertension: Secondary | ICD-10-CM | POA: Diagnosis not present

## 2019-02-07 DIAGNOSIS — M1612 Unilateral primary osteoarthritis, left hip: Secondary | ICD-10-CM

## 2019-02-07 NOTE — Patient Instructions (Signed)
Medication Instructions:  Your physician recommends that you continue on your current medications as directed. Please refer to the Current Medication list given to you today.  If you need a refill on your cardiac medications before your next appointment, please call your pharmacy.   Lab work: None.  If you have labs (blood work) drawn today and your tests are completely normal, you will receive your results only by: Marland Kitchen MyChart Message (if you have MyChart) OR . A paper copy in the mail If you have any lab test that is abnormal or we need to change your treatment, we will call you to review the results.  Testing/Procedures: None.   Follow-Up: At [facility], you and your health needs are our priority.  As part of our continuing mission to provide you with exceptional heart care, we have created designated Provider Care Teams.  These Care Teams include your primary Cardiologist (physician) and Advanced Practice Providers (APPs -  Physician Assistants and Nurse Practitioners) who all work together to provide you with the care you need, when you need it. You will need a follow up appointment in 1 months.  Please call our office 2 months in advance to schedule this appointment.  You may see Gypsy Balsam, MD or another member of our [facility] Provider Team in [place]: Norman Herrlich, MD . Belva Crome, MD  Any Other Special Instructions Will Be Listed Below (If Applicable).  Dr. Bing Matter has referred you to the pain clinic, they should call you within 1 week if not please call our office.

## 2019-02-07 NOTE — Progress Notes (Signed)
Virtual Visit via Video Note   This visit type was conducted due to national recommendations for restrictions regarding the COVID-19 Pandemic (e.g. social distancing) in an effort to limit this patient's exposure and mitigate transmission in our community.  Due to his co-morbid illnesses, this patient is at least at moderate risk for complications without adequate follow up.  This format is felt to be most appropriate for this patient at this time.  All issues noted in this document were discussed and addressed.  A limited physical exam was performed with this format.  Please refer to the patient's chart for his consent to telehealth for [facility].  Evaluation Performed:  Follow-up visit  This visit type was conducted due to national recommendations for restrictions regarding the COVID-19 Pandemic (e.g. social distancing).  This format is felt to be most appropriate for this patient at this time.  All issues noted in this document were discussed and addressed.  No physical exam was performed (except for noted visual exam findings with Video Visits).  Please refer to the patient's chart (MyChart message for video visits and phone note for telephone visits) for the patient's consent to telehealth for [facility].  Date:  02/07/2019  ID: Samantha Crimes, DOB 05/12/1966, MRN [id number]   Patient Location: [address] [place] [address]   Provider location:   [facility] [place] [facility]  PCP:  Quitman Livings, MD  Cardiologist:  Gypsy Balsam, MD     Chief Complaint: I have a lot of problems   History of Present Illness:    Peter Thompson is a 53 y.o. male  who presents via audio/video conferencing for a telehealth visit today.  I see him for paroxysmal atrial fibrillation however stress much more complicated he does have chronic pain issues.  Apparently multiple surgeries on neck fracture there is some previous and narcotics use.  Recently he ended up going to the  emergency room because of nosebleed.  That was taking care of after that he did see an ENT specialist apparently some electrocautery has been performed since that time he is doing well he is back on anticoagulation which advised him to continue for now as long as there is no bleeding.  Last time we talked about adding a little bit of beta-blocker to her already labetalol that he is taking however he did not take that beta-blocker.  Denies having any palpitation he said from heart point of view he is doing well.   The patient does not have symptoms concerning for COVID-19 infection (fever, chills, cough, or new SHORTNESS OF BREATH).    Prior CV studies:   The following studies were reviewed today:       Past Medical History:  Diagnosis Date  . Anxiety   . Arthritis   . Bursitis   . Complication of anesthesia    "woke up in OR- IV came out."  . Concussion    several- head injury- was in milatary  . DJD (degenerative joint disease)   . Hypertension   . Insomnia   . Neck fracture (HCC)   . Tinnitus, bilateral     Past Surgical History:  Procedure Laterality Date  . ANKLE SURGERY Left   . Arm surgery Right    fracture repair  . CERVICAL SPINE SURGERY     bone graft from left hip  . HAND SURGERY Left    drains for Infection  . HIP SURGERY Left    "scrapped" x2  .  KNEE ARTHROSCOPY Right   . LEG SURGERY Left    "1 inch took out"  . SHOULDER ARTHROSCOPY Left 12/27/2016   Procedure: ARTHROSCOPY SHOULDER;  Surgeon: Marcene Corning, MD;  Location: [facility] OR;  Service: Orthopedics;  Laterality: Left;  Debridement, AC Decompression, Acromioplasty   . TOTAL HIP ARTHROPLASTY Left 05/10/2016   Procedure: TOTAL HIP ARTHROPLASTY ANTERIOR APPROACH;  Surgeon: Marcene Corning, MD;  Location: [facility] OR;  Service: Orthopedics;  Laterality: Left;  . TOTAL KNEE ARTHROPLASTY Right 10/02/2018   Procedure: TOTAL KNEE ARTHROPLASTY;  Surgeon: Marcene Corning, MD;  Location: [facility] OR;  Service: Orthopedics;   Laterality: Right;  . WRIST SURGERY Right    2 Pinns     Current Meds  Medication Sig  . amLODipine (NORVASC) 10 MG tablet Take 10 mg by mouth daily.  . cloNIDine (CATAPRES) 0.2 MG tablet Take 0.2 mg by mouth 3 (three) times daily.  . furosemide (LASIX) 20 MG tablet Take 40 mg by mouth every morning.  . hydrALAZINE (APRESOLINE) 100 MG tablet Take 100 mg by mouth 3 (three) times daily.  Marland Kitchen labetalol (NORMODYNE) 300 MG tablet Take 1 tablet (300 mg total) by mouth 2 (two) times daily.  Marland Kitchen lisinopril-hydrochlorothiazide (PRINZIDE,ZESTORETIC) 20-25 MG tablet Take 1 tablet by mouth daily.  . methocarbamol (ROBAXIN) 750 MG tablet Take 1 tablet (750 mg total) by mouth every 6 (six) hours as needed for muscle spasms.  . metoprolol succinate (TOPROL-XL) 25 MG 24 hr tablet Take 1 tablet (25 mg total) by mouth daily. Take with or immediately following a meal.  . Multiple Vitamins-Minerals (MULTIVITAMIN WITH MINERALS) tablet Take 1 tablet by mouth daily.  Marland Kitchen oxyCODONE 20 MG TABS Take 1 tablet (20 mg total) by mouth every 4 (four) hours as needed (severe pain). (Patient taking differently: Take 20 mg by mouth every 4 (four) hours. )  . potassium chloride (K-DUR) 10 MEQ tablet Take 10 mEq by mouth daily.  . rivaroxaban (XARELTO) 20 MG TABS tablet Take 1 tablet (20 mg total) by mouth daily with supper.  . triamcinolone cream (KENALOG) 0.1 % Apply 1 application topically 2 (two) times daily.      Family History: The patient's family history is negative for Heart disease.   ROS:   Please see the history of present illness.     All other systems reviewed and are negative.   Labs/Other Tests and Data Reviewed:     Recent Labs: 10/03/2018: Magnesium 2.0; TSH 0.495 10/10/2018: BUN 8; Creatinine, Ser 0.58; Potassium 3.9; Sodium 128 10/17/2018: Hemoglobin 9.8; Platelets 404  Recent Lipid Panel No results found for: CHOL, TRIG, HDL, CHOLHDL, VLDL, LDLCALC, LDLDIRECT    Exam:    Vital Signs:  BP (!)  152/86   Pulse 87     Wt Readings from Last 3 Encounters:  12/26/18 252 lb 6.4 oz (114.5 kg)  10/26/18 250 lb 12.8 oz (113.8 kg)  10/17/18 257 lb 12.8 oz (116.9 kg)     Well nourished, well developed in no acute distress. Alert awake oriented x3 but very sluggish and sleepy and slow in responses.  I am afraid he took again some pain medications.  Denies having an issue when I am talking to him.  Diagnosis for this visit:   1. Essential hypertension   2. Paroxysmal atrial fibrillation (HCC)   3. Primary osteoarthritis of left hip      ASSESSMENT & PLAN:    1.  Essential hypertension blood pressure well controlled continue present management. 2.  Paroxysmal atrial  fibrillation he said he does not have any palpitations I will bring him to the office next month to recheck his rhythm. 3.  Primary osteoarthritis of left hip.  Status post surgery.  Multiple pains I will refer him to pain clinic  COVID-19 Education: The signs and symptoms of COVID-19 were discussed with the patient and how to seek care for testing (follow up with PCP or arrange E-visit).  The importance of social distancing was discussed today.  Patient Risk:   After full review of this patients clinical status, I feel that they are at least moderate risk at this time.  Time:   Today, I have spent 18 minutes with the patient with telehealth technology discussing pt health issues.  I spent 5 minutes reviewing her chart before the visit.  Visit was finished at 3:45 PM.    Medication Adjustments/Labs and Tests Ordered: Current medicines are reviewed at length with the patient today.  Concerns regarding medicines are outlined above.  No orders of the defined types were placed in this encounter.  Medication changes: No orders of the defined types were placed in this encounter.    Disposition: Follow-up next month in the office  Signed, Georgeanna Leaobert J. , MD, [facility] 02/07/2019 3:45 PM    [facility]

## 2019-02-25 ENCOUNTER — Telehealth: Payer: Self-pay | Admitting: *Deleted

## 2019-03-12 ENCOUNTER — Ambulatory Visit (INDEPENDENT_AMBULATORY_CARE_PROVIDER_SITE_OTHER): Payer: Medicaid Other | Admitting: Cardiology

## 2019-03-12 ENCOUNTER — Encounter: Payer: Self-pay | Admitting: Cardiology

## 2019-03-12 ENCOUNTER — Other Ambulatory Visit: Payer: Self-pay

## 2019-03-12 VITALS — BP 130/84 | HR 79 | Ht 69.0 in | Wt 247.4 lb

## 2019-03-12 DIAGNOSIS — I4819 Other persistent atrial fibrillation: Secondary | ICD-10-CM | POA: Diagnosis not present

## 2019-03-12 DIAGNOSIS — M1612 Unilateral primary osteoarthritis, left hip: Secondary | ICD-10-CM | POA: Diagnosis not present

## 2019-03-12 DIAGNOSIS — I10 Essential (primary) hypertension: Secondary | ICD-10-CM

## 2019-03-12 NOTE — Addendum Note (Signed)
Addended by: Jerl Santos R on: 03/12/2019 04:31 PM   Modules accepted: Orders

## 2019-03-12 NOTE — Progress Notes (Signed)
Cardiology Office Note:    Date:  03/12/2019   ID:  Peter CrimesScott C Quale, DOB [birth year], MRN [id number]  PCP:  Quitman LivingsHassan, Sami, MD  Cardiologist:  Gypsy Balsamobert , MD    Referring MD: Quitman LivingsHassan, Sami, MD   Chief Complaint  Patient presents with  . Follow-up  Doing well  History of Present Illness:    Peter Thompson is a 53 y.o. male with paroxysmal and now persistent atrial fibrillation.  He presented to me first with episode of atrial fibrillation, we initiated anticoagulation and then he converted spontaneously however When he was in the office he was back in atrial fibrillation.  Now rate is controlled he is anticoagulated.  We debating about converting him again I get strong suspicion that he got significant sleep apnea because of COVID-19 situation we have difficulty doing sleep study.  The plan will be to get sleep study done as quickly as possible a sleep study negative attempt to cardiovert him again if not obviously sleep apnea need to be managed appropriately and treated.  And then cardioversion will be done  Past Medical History:  Diagnosis Date  . Anxiety   . Arthritis   . Bursitis   . Complication of anesthesia    "woke up in OR- IV came out."  . Concussion    several- head injury- was in milatary  . DJD (degenerative joint disease)   . Hypertension   . Insomnia   . Neck fracture (HCC)   . Tinnitus, bilateral     Past Surgical History:  Procedure Laterality Date  . ANKLE SURGERY Left   . Arm surgery Right    fracture repair  . CERVICAL SPINE SURGERY     bone graft from left hip  . HAND SURGERY Left    drains for Infection  . HIP SURGERY Left    "scrapped" x2  . KNEE ARTHROSCOPY Right   . LEG SURGERY Left    "1 inch took out"  . SHOULDER ARTHROSCOPY Left 12/27/2016   Procedure: ARTHROSCOPY SHOULDER;  Surgeon: Marcene CorningPeter Dalldorf, MD;  [facility];  Service: Orthopedics;  Laterality: Left;  Debridement, AC Decompression, Acromioplasty   . TOTAL HIP  ARTHROPLASTY Left 05/10/2016   Procedure: TOTAL HIP ARTHROPLASTY ANTERIOR APPROACH;  Surgeon: Marcene CorningPeter Dalldorf, MD;  Location: [facility] OR;  Service: Orthopedics;  Laterality: Left;  . TOTAL KNEE ARTHROPLASTY Right 10/02/2018   Procedure: TOTAL KNEE ARTHROPLASTY;  Surgeon: Marcene Corningalldorf, Peter, MD;  Location: [facility] OR;  Service: Orthopedics;  Laterality: Right;  . WRIST SURGERY Right    2 Pinns    Current Medications: Current Meds  Medication Sig  . amLODipine (NORVASC) 10 MG tablet Take 10 mg by mouth daily.  . cloNIDine (CATAPRES) 0.2 MG tablet Take 0.2 mg by mouth 3 (three) times daily.  . furosemide (LASIX) 20 MG tablet Take 40 mg by mouth every morning.  . hydrALAZINE (APRESOLINE) 100 MG tablet Take 100 mg by mouth 3 (three) times daily.  Marland Kitchen. labetalol (NORMODYNE) 300 MG tablet Take 1 tablet (300 mg total) by mouth 2 (two) times daily.  Marland Kitchen. lisinopril-hydrochlorothiazide (PRINZIDE,ZESTORETIC) 20-25 MG tablet Take 1 tablet by mouth daily.  . methocarbamol (ROBAXIN) 750 MG tablet Take 1 tablet (750 mg total) by mouth every 6 (six) hours as needed for muscle spasms.  . Multiple Vitamins-Minerals (MULTIVITAMIN WITH MINERALS) tablet Take 1 tablet by mouth daily.  Marland Kitchen. oxyCODONE 20 MG TABS Take 1 tablet (20 mg total) by mouth every 4 (four) hours as needed (severe pain). (Patient  taking differently: Take 20 mg by mouth every 4 (four) hours. )  . potassium chloride (K-DUR) 10 MEQ tablet Take 10 mEq by mouth daily.  . rivaroxaban (XARELTO) 20 MG TABS tablet Take 1 tablet (20 mg total) by mouth daily with supper.  . triamcinolone cream (KENALOG) 0.1 % Apply 1 application topically 2 (two) times daily.     Allergies:   Patient has no known allergies.   Social History   Socioeconomic History  . Marital status: Legally Separated    Spouse name: Not on file  . Number of children: Not on file  . Years of education: Not on file  . Highest education level: Not on file  Occupational History  . Not on file  Social  Needs  . Financial resource strain: Not on file  . Food insecurity    Worry: Not on file    Inability: Not on file  . Transportation needs    Medical: Not on file    Non-medical: Not on file  Tobacco Use  . Smoking status: Former Smoker    Packs/day: 0.50    Years: 56.00    Pack years: 28.00    Types: Cigarettes  . Smokeless tobacco: Former NeurosurgeonUser    Types: Chew  . Tobacco comment: occasional - chew  Substance and Sexual Activity  . Alcohol use: Yes    Alcohol/week: 12.0 standard drinks    Types: 12 Cans of beer per week  . Drug use: No  . Sexual activity: Not on file  Lifestyle  . Physical activity    Days per week: Not on file    Minutes per session: Not on file  . Stress: Not on file  Relationships  . Social Musicianconnections    Talks on phone: Not on file    Gets together: Not on file    Attends religious service: Not on file    Active member of club or organization: Not on file    Attends meetings of clubs or organizations: Not on file    Relationship status: Not on file  Other Topics Concern  . Not on file  Social History Narrative  . Not on file     Family History: The patient's family history is negative for Heart disease. ROS:   Please see the history of present illness.    All 14 point review of systems negative except as described per history of present illness  EKGs/Labs/Other Studies Reviewed:      Recent Labs: 10/03/2018: Magnesium 2.0; TSH 0.495 10/10/2018: BUN 8; Creatinine, Ser 0.58; Potassium 3.9; Sodium 128 10/17/2018: Hemoglobin 9.8; Platelets 404  Recent Lipid Panel No results found for: CHOL, TRIG, HDL, CHOLHDL, VLDL, LDLCALC, LDLDIRECT  Physical Exam:    VS:  BP 130/84   Pulse 79   Ht 5\' 9"  (1.753 m)   Wt 247 lb 6.4 oz (112.2 kg)   SpO2 98%   BMI 36.53 kg/m     Wt Readings from Last 3 Encounters:  03/12/19 247 lb 6.4 oz (112.2 kg)  12/26/18 252 lb 6.4 oz (114.5 kg)  10/26/18 250 lb 12.8 oz (113.8 kg)     GEN:  Well nourished, well  developed in no acute distress HEENT: Normal NECK: No JVD; No carotid bruits LYMPHATICS: No lymphadenopathy CARDIAC: Irregularly irregular, no murmurs, no rubs, no gallops RESPIRATORY:  Clear to auscultation without rales, wheezing or rhonchi  ABDOMEN: Soft, non-tender, non-distended MUSCULOSKELETAL:  No edema; No deformity  SKIN: Warm and dry LOWER EXTREMITIES: no swelling  NEUROLOGIC:  Alert and oriented x 3 PSYCHIATRIC:  Normal affect   ASSESSMENT:    1. Persistent atrial fibrillation   2. Essential hypertension   3. Primary localized osteoarthritis of left hip    PLAN:    In order of problems listed above:  1. Persistent atrial fibrillation.  Rate controlled we will continue present medication which include anticoagulation plan as outlined above 2. Essential hypertension blood pressure well controlled 3. He problem chronic pain seeing pain specialist   Medication Adjustments/Labs and Tests Ordered: Current medicines are reviewed at length with the patient today.  Concerns regarding medicines are outlined above.  No orders of the defined types were placed in this encounter.  Medication changes: No orders of the defined types were placed in this encounter.   Signed, Park Liter, MD, [facility] 03/12/2019 2:57 PM    [facility]

## 2019-03-12 NOTE — Addendum Note (Signed)
Addended by: Ashok Norris on: 03/12/2019 03:03 PM   Modules accepted: Orders

## 2019-03-12 NOTE — Patient Instructions (Signed)
Medication Instructions:  Your physician recommends that you continue on your current medications as directed. Please refer to the Current Medication list given to you today.  If you need a refill on your cardiac medications before your next appointment, please call your pharmacy.   Lab work: None.  If you have labs (blood work) drawn today and your tests are completely normal, you will receive your results only by: Marland Kitchen MyChart Message (if you have MyChart) OR . A paper copy in the mail If you have any lab test that is abnormal or we need to change your treatment, we will call you to review the results.  Testing/Procedures: Your physician has recommended that you have a sleep study. This test records several body functions during sleep, including: brain activity, eye movement, oxygen and carbon dioxide blood levels, heart rate and rhythm, breathing rate and rhythm, the flow of air through your mouth and nose, snoring, body muscle movements, and chest and belly movement.    Follow-Up: At [facility], you and your health needs are our priority.  As part of our continuing mission to provide you with exceptional heart care, we have created designated Provider Care Teams.  These Care Teams include your primary Cardiologist (physician) and Advanced Practice Providers (APPs -  Physician Assistants and Nurse Practitioners) who all work together to provide you with the care you need, when you need it. You will need a follow up appointment in 1 months.  Please call our office 2 months in advance to schedule this appointment.  You may see Jenne Campus, MD or another member of our [facility] Provider Team in [place]: Shirlee More, MD . Jyl Heinz, MD  Any Other Special Instructions Will Be Listed Below (If Applicable).

## 2019-03-20 ENCOUNTER — Telehealth: Payer: Self-pay | Admitting: *Deleted

## 2019-03-20 ENCOUNTER — Encounter: Payer: Self-pay | Admitting: *Deleted

## 2019-03-20 NOTE — Telephone Encounter (Signed)
Patient  Going to receive neck injections and needs a note saying he can be off Xarelto 5 days. Pleas advise

## 2019-03-20 NOTE — Telephone Encounter (Signed)
It is not desirabl eto stpr xarelto , but risk and benefit. I know that he has a a lot of pain, it should be acceptable to stop xarelto for 5 days if he is ready to accept the risk

## 2019-03-20 NOTE — Telephone Encounter (Signed)
Telephone call to patient . He accepts the risk and asked that the note be mailed to him. Letter generated and mailed to patient's home.

## 2019-03-20 NOTE — Telephone Encounter (Signed)
Pt needs doctors note that says he can stop taking Xarelto for 5 days for neck injections. Can mail to pt's address.

## 2019-04-05 ENCOUNTER — Encounter: Payer: Self-pay | Admitting: Cardiology

## 2019-04-12 ENCOUNTER — Ambulatory Visit: Payer: Medicaid Other | Admitting: Cardiology

## 2019-04-15 ENCOUNTER — Ambulatory Visit: Payer: Medicaid Other | Admitting: Cardiology

## 2019-06-06 ENCOUNTER — Other Ambulatory Visit: Payer: Self-pay

## 2019-06-06 ENCOUNTER — Telehealth: Payer: Self-pay | Admitting: *Deleted

## 2019-06-06 ENCOUNTER — Ambulatory Visit (INDEPENDENT_AMBULATORY_CARE_PROVIDER_SITE_OTHER): Payer: Medicaid Other | Admitting: Cardiology

## 2019-06-06 ENCOUNTER — Encounter: Payer: Self-pay | Admitting: Cardiology

## 2019-06-06 VITALS — BP 128/78 | HR 72 | Ht 69.0 in | Wt 247.0 lb

## 2019-06-06 DIAGNOSIS — I10 Essential (primary) hypertension: Secondary | ICD-10-CM

## 2019-06-06 DIAGNOSIS — I4819 Other persistent atrial fibrillation: Secondary | ICD-10-CM | POA: Diagnosis not present

## 2019-06-06 DIAGNOSIS — R0683 Snoring: Secondary | ICD-10-CM

## 2019-06-06 MED ORDER — APIXABAN 5 MG PO TABS: 5.0000 mg | ORAL_TABLET | Freq: Two times a day (BID) | ORAL | 3 refills | Status: DC

## 2019-06-06 NOTE — Telephone Encounter (Signed)
Staff message sent to [facility] does not require a PA for sleep study. Ok to schedule.

## 2019-06-06 NOTE — Progress Notes (Signed)
Cardiology Office Note:    Date:  06/06/2019   ID:  Peter Thompson, DOB 06/29/1966, MRN [id number]  PCP:  Antonietta Jewel, MD  Cardiologist:  Jenne Campus, MD    Referring MD: Antonietta Jewel, MD   Chief Complaint  Patient presents with  . Follow-up  Overall doing well  History of Present Illness:    Peter Thompson is a 53 y.o. male with paroxysmal atrial fibrillation, status post cardioversion and then his life back to atrial fibrillation.  Plan was to schedule him to have a sleep study if it is negative proceed with cardioversion again hopefully with antiarrhythmic on board however because of coronavirus situation has been delayed.  His chads 2 vascular is actually 1 therefore anticoagulation is not absolutely required.  In the future we may discontinue anticoagulation.  For now I will continue.  Past Medical History:  Diagnosis Date  . Anxiety   . Arthritis   . Bursitis   . Complication of anesthesia    "woke up in OR- IV came out."  . Concussion    several- head injury- was in [place]  . DJD (degenerative joint disease)   . Hypertension   . Insomnia   . Neck fracture ([place])   . Tinnitus, bilateral     Past Surgical History:  Procedure Laterality Date  . ANKLE SURGERY Left   . Arm surgery Right    fracture repair  . CERVICAL SPINE SURGERY     bone graft from left hip  . HAND SURGERY Left    drains for Infection  . HIP SURGERY Left    "scrapped" x2  . KNEE ARTHROSCOPY Right   . LEG SURGERY Left    "1 inch took out"  . SHOULDER ARTHROSCOPY Left 12/27/2016   Procedure: ARTHROSCOPY SHOULDER;  Surgeon: Melrose Nakayama, MD;  Location: [facility];  Service: Orthopedics;  Laterality: Left;  Debridement, AC Decompression, Acromioplasty   . TOTAL HIP ARTHROPLASTY Left 05/10/2016   Procedure: TOTAL HIP ARTHROPLASTY ANTERIOR APPROACH;  Surgeon: Melrose Nakayama, MD;  Location: [facility];  Service: Orthopedics;  Laterality: Left;  . TOTAL KNEE ARTHROPLASTY Right 10/02/2018   Procedure: TOTAL KNEE ARTHROPLASTY;  Surgeon: Melrose Nakayama, MD;  Location: [facility];  Service: Orthopedics;  Laterality: Right;  . WRIST SURGERY Right    2 Pinns    Current Medications: Current Meds  Medication Sig  . amLODipine (NORVASC) 10 MG tablet Take 10 mg by mouth daily.  . cloNIDine (CATAPRES) 0.2 MG tablet Take 0.2 mg by mouth 3 (three) times daily.  . furosemide (LASIX) 20 MG tablet Take 40 mg by mouth every morning.  . hydrALAZINE (APRESOLINE) 100 MG tablet Take 100 mg by mouth 3 (three) times daily.  Marland Kitchen labetalol (NORMODYNE) 300 MG tablet Take 1 tablet (300 mg total) by mouth 2 (two) times daily.  Marland Kitchen lisinopril-hydrochlorothiazide (PRINZIDE,ZESTORETIC) 20-25 MG tablet Take 1 tablet by mouth daily.  . Multiple Vitamins-Minerals (MULTIVITAMIN WITH MINERALS) tablet Take 1 tablet by mouth daily.  Marland Kitchen oxyCODONE 20 MG TABS Take 1 tablet (20 mg total) by mouth every 4 (four) hours as needed (severe pain). (Patient taking differently: Take 20 mg by mouth every 4 (four) hours. )  . potassium chloride (K-DUR) 10 MEQ tablet Take 10 mEq by mouth daily.  . rivaroxaban (XARELTO) 20 MG TABS tablet Take 1 tablet (20 mg total) by mouth daily with supper.  . triamcinolone cream (KENALOG) 0.1 % Apply 1 application topically 2 (two) times daily.     Allergies:  Patient has no known allergies.   Social History   Socioeconomic History  . Marital status: Legally Separated    Spouse name: Not on file  . Number of children: Not on file  . Years of education: Not on file  . Highest education level: Not on file  Occupational History  . Not on file  Social Needs  . Financial resource strain: Not on file  . Food insecurity    Worry: Not on file    Inability: Not on file  . Transportation needs    Medical: Not on file    Non-medical: Not on file  Tobacco Use  . Smoking status: Former Smoker    Packs/day: 0.50    Years: 56.00    Pack years: 28.00    Types: Cigarettes  . Smokeless tobacco:  Former Neurosurgeon    Types: Chew  . Tobacco comment: occasional - chew  Substance and Sexual Activity  . Alcohol use: Yes    Alcohol/week: 12.0 standard drinks    Types: 12 Cans of beer per week  . Drug use: No  . Sexual activity: Not on file  Lifestyle  . Physical activity    Days per week: Not on file    Minutes per session: Not on file  . Stress: Not on file  Relationships  . Social Musician on phone: Not on file    Gets together: Not on file    Attends religious service: Not on file    Active member of club or organization: Not on file    Attends meetings of clubs or organizations: Not on file    Relationship status: Not on file  Other Topics Concern  . Not on file  Social History Narrative  . Not on file     Family History: The patient's family history is negative for Heart disease. ROS:   Please see the history of present illness.    All 14 point review of systems negative except as described per history of present illness  EKGs/Labs/Other Studies Reviewed:      Recent Labs: 10/03/2018: Magnesium 2.0; TSH 0.495 10/10/2018: BUN 8; Creatinine, Ser 0.58; Potassium 3.9; Sodium 128 10/17/2018: Hemoglobin 9.8; Platelets 404  Recent Lipid Panel No results found for: CHOL, TRIG, HDL, CHOLHDL, VLDL, LDLCALC, LDLDIRECT  Physical Exam:    VS:  BP 128/78   Pulse 72   Ht 5\' 9"  (1.753 m)   Wt 247 lb (112 kg)   SpO2 98%   BMI 36.48 kg/m     Wt Readings from Last 3 Encounters:  06/06/19 247 lb (112 kg)  03/12/19 247 lb 6.4 oz (112.2 kg)  12/26/18 252 lb 6.4 oz (114.5 kg)     GEN:  Well nourished, well developed in no acute distress HEENT: Normal NECK: No JVD; No carotid bruits LYMPHATICS: No lymphadenopathy CARDIAC: Irregularly irregular, no murmurs, no rubs, no gallops RESPIRATORY:  Clear to auscultation without rales, wheezing or rhonchi  ABDOMEN: Soft, non-tender, non-distended MUSCULOSKELETAL:  No edema; No deformity  SKIN: Warm and dry LOWER  EXTREMITIES: no swelling NEUROLOGIC:  Alert and oriented x 3 PSYCHIATRIC:  Normal affect   ASSESSMENT:    1. Persistent atrial fibrillation   2. Essential hypertension    PLAN:    In order of problems listed above:  1. Persistent atrial fibrillation rate controlled continue present management sleep study will be done and then will decide about potential cardioversion will use of antiarrhythmic  2. Essential hypertension blood pressure  finally well controlled.    Medication Adjustments/Labs and Tests Ordered: Current medicines are reviewed at length with the patient today.  Concerns regarding medicines are outlined above.  No orders of the defined types were placed in this encounter.  Medication changes: No orders of the defined types were placed in this encounter.   Signed, Georgeanna Leaobert J. , MD, [facility] 06/06/2019 3:26 PM    [facility]

## 2019-06-06 NOTE — Patient Instructions (Signed)
Medication Instructions:  Your physician has recommended you make the following change in your medication:   STOP xarelto  START apixaban (eliquis) 5 mg: Take 1 tablet twice daily   If you need a refill on your cardiac medications before your next appointment, please call your pharmacy.   Lab work: None  If you have labs (blood work) drawn today and your tests are completely normal, you will receive your results only by: Marland Kitchen MyChart Message (if you have MyChart) OR . A paper copy in the mail If you have any lab test that is abnormal or we need to change your treatment, we will call you to review the results.  Testing/Procedures: Your physician has recommended that you have a sleep study. This test records several body functions during sleep, including: brain activity, eye movement, oxygen and carbon dioxide blood levels, heart rate and rhythm, breathing rate and rhythm, the flow of air through your mouth and nose, snoring, body muscle movements, and chest and belly movement. You will be contacted to schedule this appointment.   Follow-Up: At [facility], you and your health needs are our priority.  As part of our continuing mission to provide you with exceptional heart care, we have created designated Provider Care Teams.  These Care Teams include your primary Cardiologist (physician) and Advanced Practice Providers (APPs -  Physician Assistants and Nurse Practitioners) who all work together to provide you with the care you need, when you need it. You will need a follow up appointment in 3 months.      Apixaban oral tablets What is this medicine? APIXABAN (a PIX a ban) is an anticoagulant (blood thinner). It is used to lower the chance of stroke in people with a medical condition called atrial fibrillation. It is also used to treat or prevent blood clots in the lungs or in the veins. This medicine may be used for other purposes; ask your health care provider or pharmacist if you have  questions. COMMON BRAND NAME(S): Eliquis What should I tell my health care provider before I take this medicine? They need to know if you have any of these conditions:  antiphospholipid antibody syndrome  bleeding disorders  bleeding in the brain  blood in your stools (black or tarry stools) or if you have blood in your vomit  history of blood clots  history of stomach bleeding  kidney disease  liver disease  mechanical heart valve  an unusual or allergic reaction to apixaban, other medicines, foods, dyes, or preservatives  pregnant or trying to get pregnant  breast-feeding How should I use this medicine? Take this medicine by mouth with a glass of water. Follow the directions on the prescription label. You can take it with or without food. If it upsets your stomach, take it with food. Take your medicine at regular intervals. Do not take it more often than directed. Do not stop taking except on your doctor's advice. Stopping this medicine may increase your risk of a blood clot. Be sure to refill your prescription before you run out of medicine. Talk to your pediatrician regarding the use of this medicine in children. Special care may be needed. Overdosage: If you think you have taken too much of this medicine contact a poison control center or emergency room at once. NOTE: This medicine is only for you. Do not share this medicine with others. What if I miss a dose? If you miss a dose, take it as soon as you can. If it is almost  time for your next dose, take only that dose. Do not take double or extra doses. What may interact with this medicine? This medicine may interact with the following:  aspirin and aspirin-like medicines  certain medicines for fungal infections like ketoconazole and itraconazole  certain medicines for seizures like carbamazepine and phenytoin  certain medicines that treat or prevent blood clots like warfarin, enoxaparin, and  dalteparin  clarithromycin  NSAIDs, medicines for pain and inflammation, like ibuprofen or naproxen  rifampin  ritonavir  St. John's wort This list may not describe all possible interactions. Give your health care provider a list of all the medicines, herbs, non-prescription drugs, or dietary supplements you use. Also tell them if you smoke, drink alcohol, or use illegal drugs. Some items may interact with your medicine. What should I watch for while using this medicine? Visit your healthcare professional for regular checks on your progress. You may need blood work done while you are taking this medicine. Your condition will be monitored carefully while you are receiving this medicine. It is important not to miss any appointments. Avoid sports and activities that might cause injury while you are using this medicine. Severe falls or injuries can cause unseen bleeding. Be careful when using sharp tools or knives. Consider using an Neurosurgeon. Take special care brushing or flossing your teeth. Report any injuries, bruising, or red spots on the skin to your healthcare professional. If you are going to need surgery or other procedure, tell your healthcare professional that you are taking this medicine. Wear a medical ID bracelet or chain. Carry a card that describes your disease and details of your medicine and dosage times. What side effects may I notice from receiving this medicine? Side effects that you should report to your doctor or health care professional as soon as possible:  allergic reactions like skin rash, itching or hives, swelling of the face, lips, or tongue  signs and symptoms of bleeding such as bloody or black, tarry stools; red or dark-brown urine; spitting up blood or brown material that looks like coffee grounds; red spots on the skin; unusual bruising or bleeding from the eye, gums, or nose  signs and symptoms of a blood clot such as chest pain; shortness of breath; pain,  swelling, or warmth in the leg  signs and symptoms of a stroke such as changes in vision; confusion; trouble speaking or understanding; severe headaches; sudden numbness or weakness of the face, arm or leg; trouble walking; dizziness; loss of coordination This list may not describe all possible side effects. Call your doctor for medical advice about side effects. You may report side effects to FDA at 1-800-FDA-1088. Where should I keep my medicine? Keep out of the reach of children. Store at room temperature between 20 and 25 degrees C (68 and 77 degrees F). Throw away any unused medicine after the expiration date. NOTE: This sheet is a summary. It may not cover all possible information. If you have questions about this medicine, talk to your doctor, pharmacist, or health care provider.  2020 Elsevier/Gold Standard (2018-05-09 17:39:34)

## 2019-06-07 ENCOUNTER — Telehealth: Payer: Self-pay | Admitting: *Deleted

## 2019-06-07 NOTE — Telephone Encounter (Signed)
-----   Message from Lauralee Evener, [place] sent at 06/06/2019  4:04 PM EDT ----- Regarding: RE: Please precert Patient's insurance does not require a pre cert. Ok to schedule. ----- Message ----- From: Austin Miles, RN Sent: 06/06/2019   3:32 PM EDT To: [facility] Sleep Studies Subject: Please precert                                 Please precert this patient to have a sleep study due to paroxsymal atrial fibrillation and snoring per Dr. Agustin Cree. Thanks!

## 2019-06-07 NOTE — Telephone Encounter (Signed)
Called patient to inform him that his sleep study has been scheduled at the [facility] on Monday, 06/24/2019. Patient states he does not want to drive to [facility] for testing and prefers to have it arranged in [place].   Called and cancelled sleep study in [place]. Will arrange for [place] and update patient accordingly.

## 2019-06-10 ENCOUNTER — Telehealth: Payer: Self-pay | Admitting: Cardiology

## 2019-06-10 NOTE — Telephone Encounter (Signed)
Patient is asking for a call regarding his sleep apnea issues. His phone is [phone]

## 2019-06-11 IMAGING — CT CT VENOGRAM ABD-PELV
3 of 10 series · 10 of 46 positions shown, 15 images · IV contrast (omnipaque)
Comparison: Lower extremity duplex report from the [REDACTED]
earlier today which was negative for any significant DVT but
limited.

CLINICAL DATA: Palpitations, right leg pain, atrial fibrillation on
Xarelto knee replacement 1 week ago diffuse right leg swelling

EXAM:
CT ABDOMEN AND PELVIS WITH CONTRAST (performed as a CT venogram)
TECHNIQUE: Multidetector CT imaging of the abdomen and pelvis was performed
using the standard protocol following bolus administration of
intravenous contrast.
CONTRAST:  100mL OMNIPAQUE IOHEXOL 300 MG/ML  SOLN

[Series 3: portal venous · axial · portal-venous · 0.98mm/px · z∈[-75,+1070]mm · 3 of 230 slices shown, 7 images]
[im 1/230  soft-tissue]
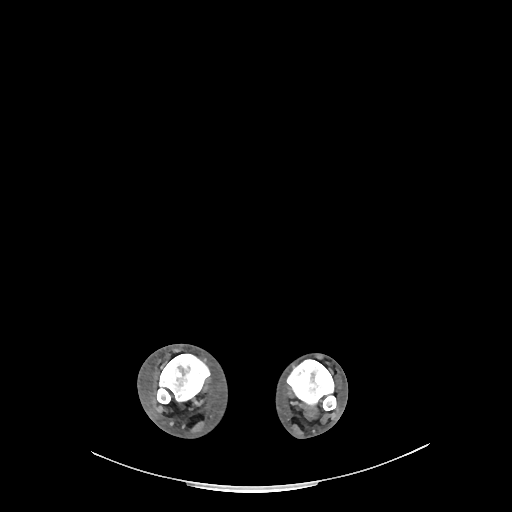
[im 1/230  lung]
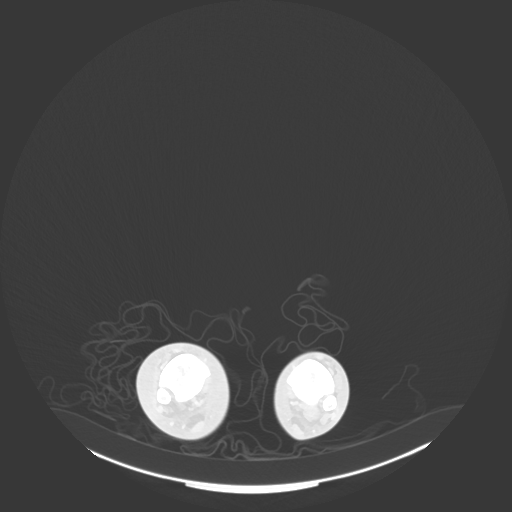
[im 1/230  bone]
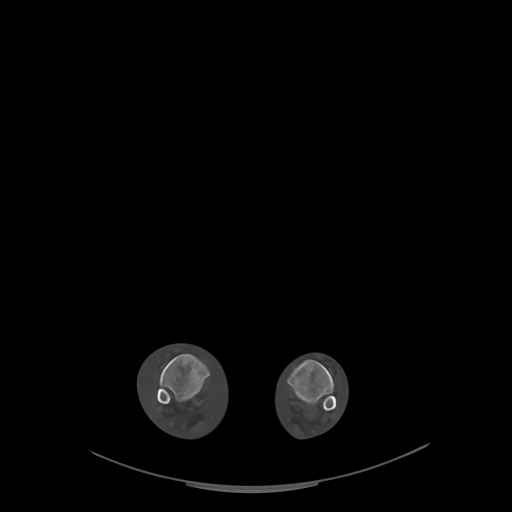
[im 115/230  soft-tissue]
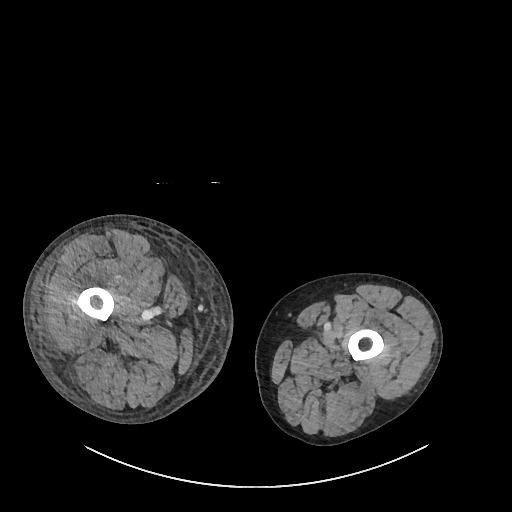
[im 115/230  lung]
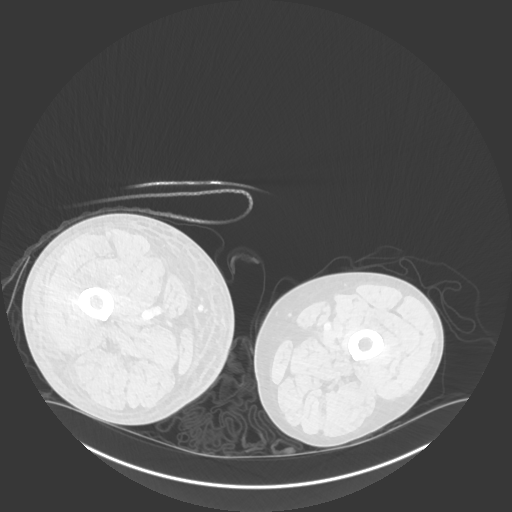
[im 230/230  soft-tissue]
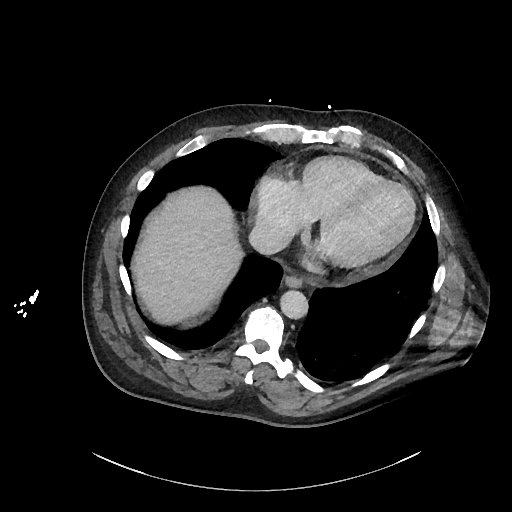
[im 230/230  lung]
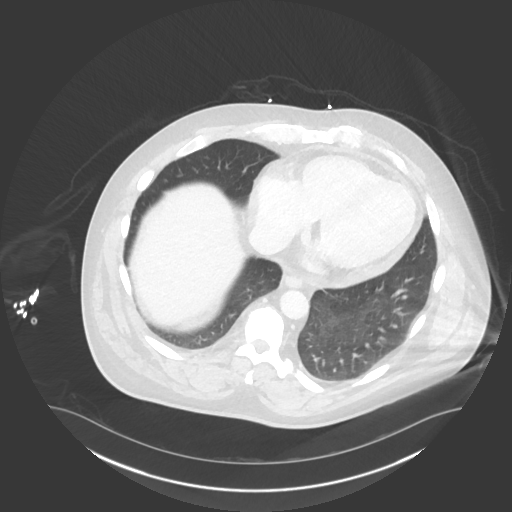

[Series 4: cor · coronal · 1.16mm/px · 2 of 213 slices shown, 3 images]
[im 71/213  soft-tissue]
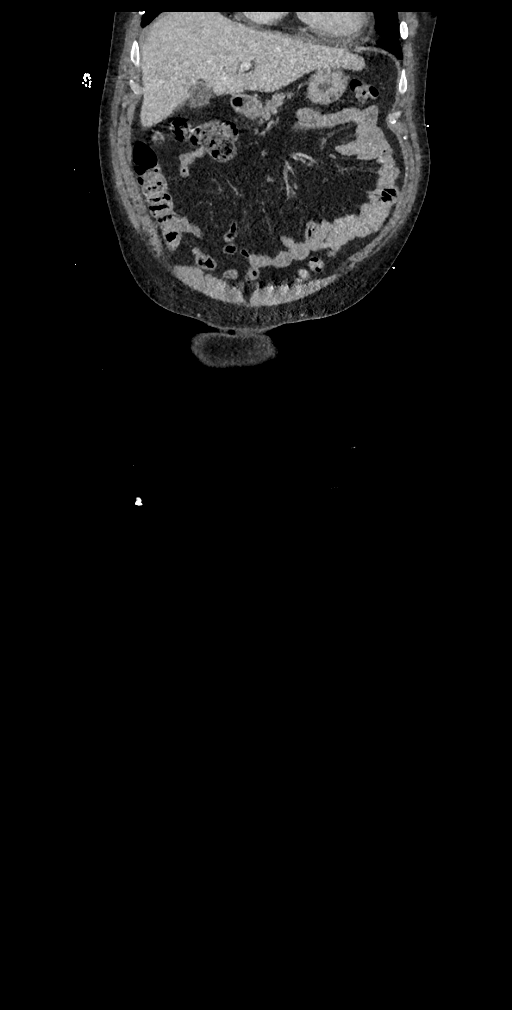
[im 71/213  bone]
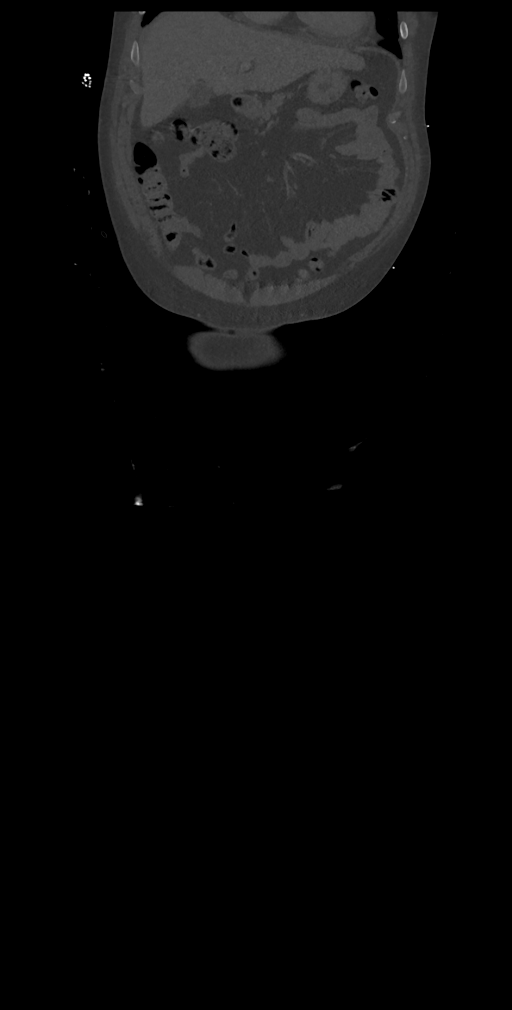
[im 142/213  soft-tissue]
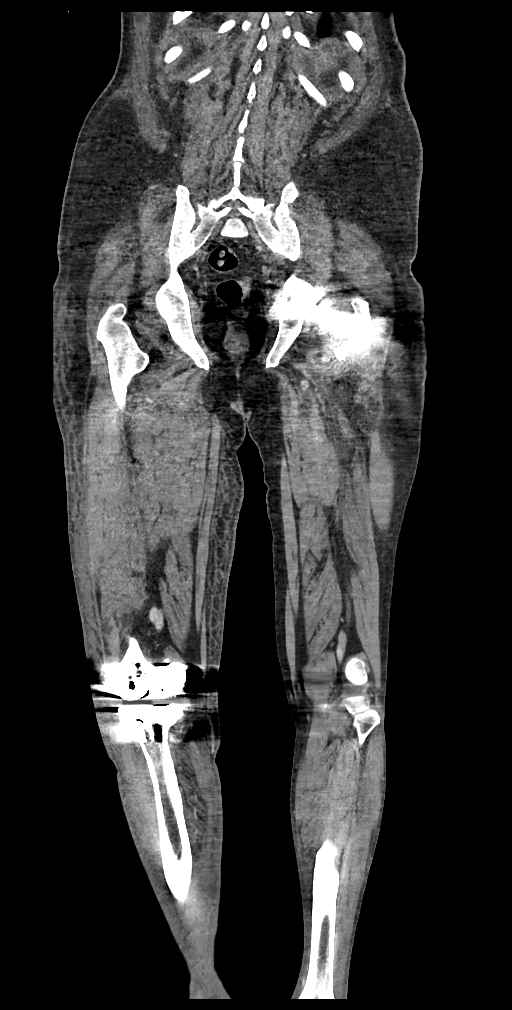

[Series 7: venous thins · axial · portal-venous · 0.98mm/px · z∈[+29,+551]mm · 5 of 1900 slices shown]
[im 159/1900  soft-tissue]
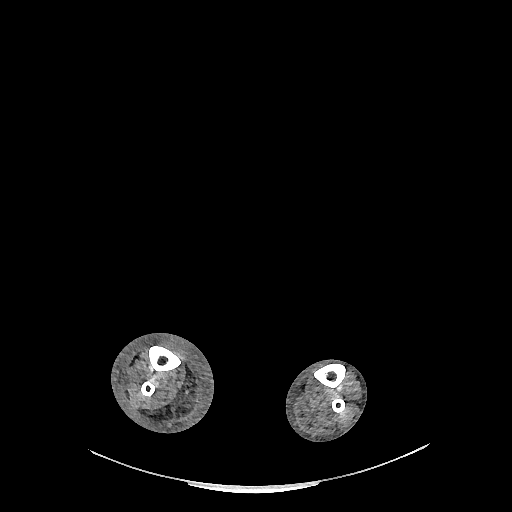
[im 396/1900  soft-tissue]
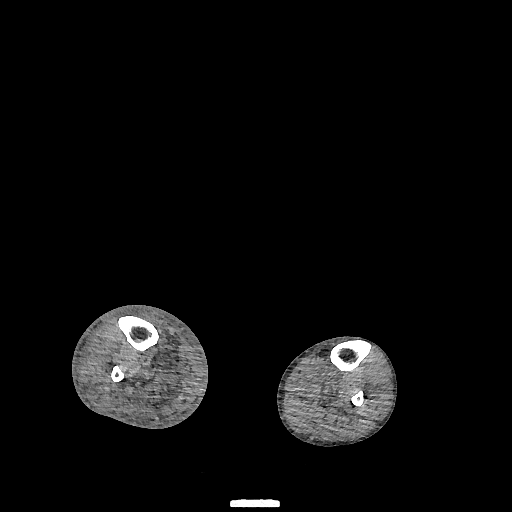
[im 634/1900  soft-tissue]
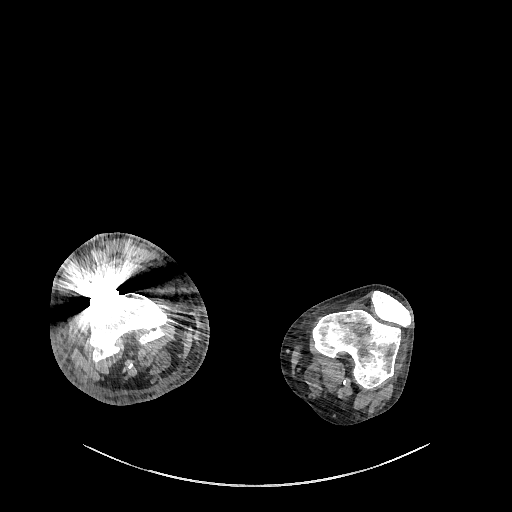
[im 871/1900  soft-tissue]
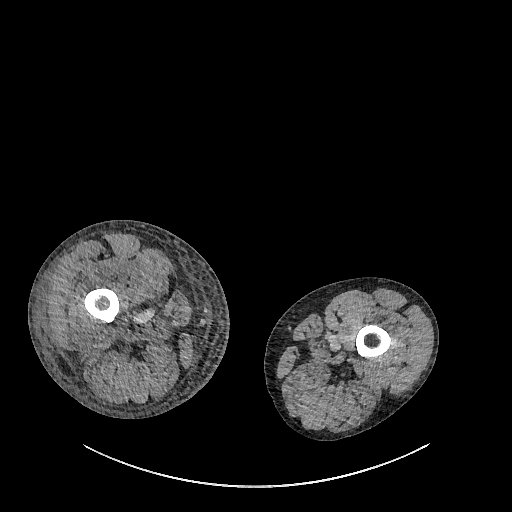
[im 1029/1900  soft-tissue]
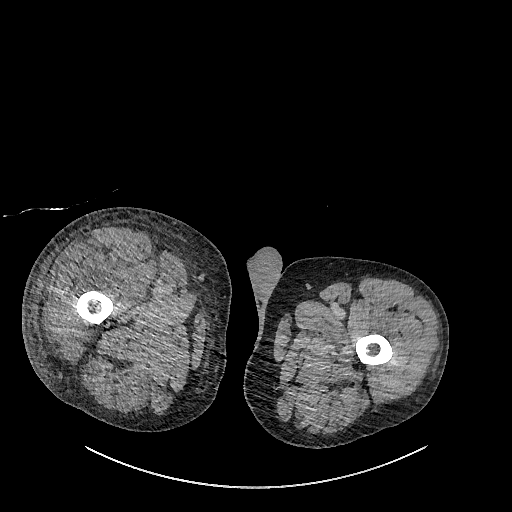

[10 of 46 positions shown; findings below may reference images not displayed]

FINDINGS: Lower chest: Minor basilar atelectasis. Trace pericardial effusion.
Normal heart size. No pleural effusion.

Hepatobiliary: No focal liver abnormality is seen. No gallstones,
gallbladder wall thickening, or biliary dilatation.

Pancreas: Unremarkable. No pancreatic ductal dilatation or
surrounding inflammatory changes.

Spleen: Normal in size without focal abnormality.

Adrenals/Urinary Tract: Adrenal glands are unremarkable. Kidneys are
normal, without renal calculi, focal lesion, or hydronephrosis.
Bladder is unremarkable.

Stomach/Bowel: Stomach is within normal limits. Appendix appears
normal. No evidence of bowel wall thickening, distention, or
inflammatory changes.

Vascular/Lymphatic: Aortoiliac atherosclerosis noted. Negative for
aneurysm or occlusive process. Calcifications of the mesenteric and
renal vasculature. No retroperitoneal hemorrhage or hematoma.

Venous phase imaging: No significant Ilf process.

The hepatic, portal, splenic, and mesenteric veins are all patent.
IVC and renal veins are patent.

Left iliac and left lower extremity veins are poorl y pacified but
no significant distension or secondary signs of DVT.

Right lower extremity veins are better opacified and appear grossly
patent. Specifically the right iliac, femoral, popliteal, and calf
veins all appear grossly patent. No significant thrombus or DVT
demonstrated.

Prominent inguinal lymph nodes. No bulky adenopathy. No
retroperitoneal adenopathy.

Reproductive: Unremarkable by CT.

Other: Left hip replacement creates artifact through the pelvis
region. No significant ascites. Intact abdominal wall. No hernia.

Musculoskeletal: Flank and lower back body anasarca noted. Diffuse
right greater than left lower extremity subcutaneous edema. Right
lower extremity demonstrates extensive subcutaneous edema from the
thigh region to the ankle.

Postop changes of the right knee. Right knee moderate hemarthrosis
noted with intra joint air related to the recent knee replacement.

No other acute osseous finding. Degenerative changes noted of the
lumbar spine.
IMPRESSION: No significant Ilf process or DVT demonstrated in the
right lower extremity.

Limited venous opacification of the left iliac and femoral veins but
no secondary signs of DVT left leg.

Trace pericardial effusion.  Minor basilar atelectasis.

Aortoiliac atherosclerosis without occlusive process or aneurysm.

No acute intra-abdominal or pelvic finding by CT

Diffuse right lower extremity subcutaneous edema from the inguinal
region to the ankle.

Postop changes of the right knee joint from knee replacement with a
right knee moderate hemarthrosis and intra joint air related to the
recent surgery.

## 2019-06-11 NOTE — Telephone Encounter (Signed)
Patient called back, states he had a sleep study Sunday night and wants to know the results and how he can get a CPAP machine. Will forward to Barry Brunner who normally handles these results. Advised patient to call back if he doesn't hears something within the next 2 days. Patient verbally understands. No further questions.

## 2019-06-11 NOTE — Telephone Encounter (Signed)
Left message for patient to return call.

## 2019-06-12 NOTE — Telephone Encounter (Signed)
If he just had the sleep study it has not been read. It may be a couple of weeks. Dr Claiborne Billings will be out on vacation at the beginning of next week. Once it has been read and sent to me I will call the patient. If a CPAP  Titration is needed he will need to have this first before the machine gets ordered. The process is not fast. We will call him once it is ready.

## 2019-06-13 NOTE — Telephone Encounter (Signed)
Left message for patient to return call.

## 2019-06-21 NOTE — Telephone Encounter (Signed)
Left another message for patient to return call.

## 2019-06-24 ENCOUNTER — Encounter (HOSPITAL_BASED_OUTPATIENT_CLINIC_OR_DEPARTMENT_OTHER): Payer: Medicaid Other | Admitting: Cardiovascular Disease

## 2019-06-25 NOTE — Telephone Encounter (Signed)
Called patient. He reports he already go the info he needed from Dr. Claiborne Billings. No further questions.

## 2019-09-04 ENCOUNTER — Ambulatory Visit (INDEPENDENT_AMBULATORY_CARE_PROVIDER_SITE_OTHER): Payer: Medicaid Other | Admitting: Cardiology

## 2019-09-04 ENCOUNTER — Other Ambulatory Visit: Payer: Self-pay

## 2019-09-04 ENCOUNTER — Encounter: Payer: Self-pay | Admitting: Cardiology

## 2019-09-04 VITALS — BP 120/66 | HR 83 | Ht 69.0 in | Wt 261.6 lb

## 2019-09-04 DIAGNOSIS — G4733 Obstructive sleep apnea (adult) (pediatric): Secondary | ICD-10-CM

## 2019-09-04 DIAGNOSIS — I10 Essential (primary) hypertension: Secondary | ICD-10-CM | POA: Diagnosis not present

## 2019-09-04 DIAGNOSIS — I4819 Other persistent atrial fibrillation: Secondary | ICD-10-CM | POA: Diagnosis not present

## 2019-09-04 HISTORY — DX: Obstructive sleep apnea (adult) (pediatric): G47.33

## 2019-09-04 NOTE — Progress Notes (Signed)
Cardiology Office Note:    Date:  09/04/2019   ID:  Peter CrimesScott C Barrantes, DOB 1966-01-08, MRN [id number]  PCP:  Quitman LivingsHassan, Sami, MD  Cardiologist:  Gypsy Balsamobert , MD    Referring MD: Quitman LivingsHassan, Sami, MD   Chief Complaint  Patient presents with  . Follow-up  Doing better  History of Present Illness:    Peter Thompson is a 53 y.o. male with paroxysmal atrial fibrillation, status post 1 cardioversion but very quickly he went back to atrial fibrillation again.  We did more investigation we discovered that he does have severe sleep apnea.  That is being managed appropriately by pulmonary team is doing much better he has more energy he sleeps better.  Denies having a palpitations no dizziness no passing out.  Plan is to continue using CPAP mask for another 3 weeks and then attempt cardioversion.  Past Medical History:  Diagnosis Date  . Anxiety   . Arthritis   . Bursitis   . Complication of anesthesia    "woke up in OR- IV came out."  . Concussion    several- head [place]- [place]  . DJD (degenerative joint disease)   . Hypertension   . Insomnia   . Neck fracture (HCC)   . Tinnitus, bilateral     Past Surgical History:  Procedure Laterality Date  . ANKLE SURGERY Left   . Arm surgery Right    fracture repair  . CERVICAL SPINE SURGERY     bone graft from left hip  . HAND SURGERY Left    drains for Infection  . HIP SURGERY Left    "scrapped" x2  . KNEE ARTHROSCOPY Right   . LEG SURGERY Left    "1 inch took out"  . SHOULDER ARTHROSCOPY Left 12/27/2016   Procedure: ARTHROSCOPY SHOULDER;  Surgeon: Marcene CorningPeter Dalldorf, MD;  [facility];  Service: Orthopedics;  Laterality: Left;  Debridement, AC Decompression, Acromioplasty   . TOTAL HIP ARTHROPLASTY Left 05/10/2016   Procedure: TOTAL HIP ARTHROPLASTY ANTERIOR APPROACH;  Surgeon: Marcene CorningPeter Dalldorf, MD;  Location: [facility] OR;  Service: Orthopedics;  Laterality: Left;  . TOTAL KNEE ARTHROPLASTY Right 10/02/2018   Procedure: TOTAL KNEE  ARTHROPLASTY;  Surgeon: Marcene Corningalldorf, Peter, MD;  Location: [facility] OR;  Service: Orthopedics;  Laterality: Right;  . WRIST SURGERY Right    2 Pinns    Current Medications: Current Meds  Medication Sig  . amLODipine (NORVASC) 10 MG tablet Take 10 mg by mouth daily.  Marland Kitchen. apixaban (ELIQUIS) 5 MG TABS tablet Take 1 tablet (5 mg total) by mouth 2 (two) times daily.  . cloNIDine (CATAPRES) 0.2 MG tablet Take 0.2 mg by mouth 3 (three) times daily.  . furosemide (LASIX) 20 MG tablet Take 40 mg by mouth every morning.  . hydrALAZINE (APRESOLINE) 100 MG tablet Take 100 mg by mouth 3 (three) times daily.  Marland Kitchen. labetalol (NORMODYNE) 300 MG tablet Take 1 tablet (300 mg total) by mouth 2 (two) times daily.  Marland Kitchen. lisinopril-hydrochlorothiazide (PRINZIDE,ZESTORETIC) 20-25 MG tablet Take 1 tablet by mouth daily.  . Multiple Vitamins-Minerals (MULTIVITAMIN WITH MINERALS) tablet Take 1 tablet by mouth daily.  Marland Kitchen. oxyCODONE 20 MG TABS Take 1 tablet (20 mg total) by mouth every 4 (four) hours as needed (severe pain). (Patient taking differently: Take 20 mg by mouth every 4 (four) hours. )  . potassium chloride (K-DUR) 10 MEQ tablet Take 10 mEq by mouth daily.  Marland Kitchen. triamcinolone cream (KENALOG) 0.1 % Apply 1 application topically 2 (two) times daily.  Allergies:   Patient has no known allergies.   Social History   Socioeconomic History  . Marital status: Legally Separated    Spouse name: Not on file  . Number of children: Not on file  . Years of education: Not on file  . Highest education level: Not on file  Occupational History  . Not on file  Tobacco Use  . Smoking status: Former Smoker    Packs/day: 0.50    Years: 56.00    Pack years: 28.00    Types: Cigarettes  . Smokeless tobacco: Former Neurosurgeon    Types: Chew  . Tobacco comment: occasional - chew  Substance and Sexual Activity  . Alcohol use: Yes    Alcohol/week: 12.0 standard drinks    Types: 12 Cans of beer per week  . Drug use: No  . Sexual activity:  Not on file  Other Topics Concern  . Not on file  Social History Narrative  . Not on file   Social Determinants of Health   Financial Resource Strain:   . Difficulty of Paying Living Expenses: Not on file  Food Insecurity:   . Worried About Programme researcher, broadcasting/film/video in the Last Year: Not on file  . Ran Out of Food in the Last Year: Not on file  Transportation Needs:   . Lack of Transportation (Medical): Not on file  . Lack of Transportation (Non-Medical): Not on file  Physical Activity:   . Days of Exercise per Week: Not on file  . Minutes of Exercise per Session: Not on file  Stress:   . Feeling of Stress : Not on file  Social Connections:   . Frequency of Communication with Friends and Family: Not on file  . Frequency of Social Gatherings with Friends and Family: Not on file  . Attends Religious Services: Not on file  . Active Member of Clubs or Organizations: Not on file  . Attends Banker Meetings: Not on file  . Marital Status: Not on file     Family History: The patient's family history is negative for Heart disease. ROS:   Please see the history of present illness.    All 14 point review of systems negative except as described per history of present illness  EKGs/Labs/Other Studies Reviewed:      Recent Labs: 10/03/2018: Magnesium 2.0; TSH 0.495 10/10/2018: BUN 8; Creatinine, Ser 0.58; Potassium 3.9; Sodium 128 10/17/2018: Hemoglobin 9.8; Platelets 404  Recent Lipid Panel No results found for: CHOL, TRIG, HDL, CHOLHDL, VLDL, LDLCALC, LDLDIRECT  Physical Exam:    VS:  BP 120/66   Pulse 83   Ht 5\' 9"  (1.753 m)   Wt 261 lb 9.6 oz (118.7 kg)   SpO2 96%   BMI 38.63 kg/m     Wt Readings from Last 3 Encounters:  09/04/19 261 lb 9.6 oz (118.7 kg)  06/06/19 247 lb (112 kg)  03/12/19 247 lb 6.4 oz (112.2 kg)     GEN:  Well nourished, well developed in no acute distress HEENT: Normal NECK: No JVD; No carotid bruits LYMPHATICS: No  lymphadenopathy CARDIAC: RRR, no murmurs, no rubs, no gallops RESPIRATORY:  Clear to auscultation without rales, wheezing or rhonchi  ABDOMEN: Soft, non-tender, non-distended MUSCULOSKELETAL:  No edema; No deformity  SKIN: Warm and dry LOWER EXTREMITIES: no swelling NEUROLOGIC:  Alert and oriented x 3 PSYCHIATRIC:  Normal affect   ASSESSMENT:    1. Persistent atrial fibrillation (HCC)   2. Essential hypertension   3. Obstructive  sleep apnea    PLAN:    In order of problems listed above:  1. Persistent atrial fibrillation.  Plan is to continue couple more weeks of CPAP mask and then will do cardioversion hopefully this time he will stay in normal rhythm. 2. Essential hypertension blood pressure better controlled right now with control of sleep apnea 3. Obstructive sleep apnea followed by pulmonary.   Medication Adjustments/Labs and Tests Ordered: Current medicines are reviewed at length with the patient today.  Concerns regarding medicines are outlined above.  No orders of the defined types were placed in this encounter.  Medication changes: No orders of the defined types were placed in this encounter.   Signed, Park Liter, MD, [facility] 09/04/2019 3:19 PM    [place]

## 2019-09-04 NOTE — Patient Instructions (Signed)
Medication Instructions:  Your physician recommends that you continue on your current medications as directed. Please refer to the Current Medication list given to you today.  *If you need a refill on your cardiac medications before your next appointment, please call your pharmacy*  Lab Work: None.  If you have labs (blood work) drawn today and your tests are completely normal, you will receive your results only by: . MyChart Message (if you have MyChart) OR . A paper copy in the mail If you have any lab test that is abnormal or we need to change your treatment, we will call you to review the results.  Testing/Procedures: None.   Follow-Up: At [facility], you and your health needs are our priority.  As part of our continuing mission to provide you with exceptional heart care, we have created designated Provider Care Teams.  These Care Teams include your primary Cardiologist (physician) and Advanced Practice Providers (APPs -  Physician Assistants and Nurse Practitioners) who all work together to provide you with the care you need, when you need it.  Your next appointment:   3 week(s)  The format for your next appointment:   In Person  Provider:   Robert Krasowski, MD  Other Instructions   

## 2019-09-07 ENCOUNTER — Encounter: Payer: Self-pay | Admitting: Cardiology

## 2019-09-27 ENCOUNTER — Encounter: Payer: Self-pay | Admitting: Cardiology

## 2019-09-27 ENCOUNTER — Other Ambulatory Visit: Payer: Self-pay

## 2019-09-27 ENCOUNTER — Ambulatory Visit (INDEPENDENT_AMBULATORY_CARE_PROVIDER_SITE_OTHER): Payer: Medicaid Other | Admitting: Cardiology

## 2019-09-27 VITALS — BP 142/82 | HR 51 | Ht 69.0 in | Wt 261.0 lb

## 2019-09-27 DIAGNOSIS — G4733 Obstructive sleep apnea (adult) (pediatric): Secondary | ICD-10-CM

## 2019-09-27 DIAGNOSIS — I4819 Other persistent atrial fibrillation: Secondary | ICD-10-CM

## 2019-09-27 NOTE — Patient Instructions (Signed)
Medication Instructions:  Your physician recommends that you continue on your current medications as directed. Please refer to the Current Medication list given to you today.  *If you need a refill on your cardiac medications before your next appointment, please call your pharmacy*  Lab Work: None  If you have labs (blood work) drawn today and your tests are completely normal, you will receive your results only by: . MyChart Message (if you have MyChart) OR . A paper copy in the mail If you have any lab test that is abnormal or we need to change your treatment, we will call you to review the results.  Testing/Procedures: None.   Follow-Up: At [facility], you and your health needs are our priority.  As part of our continuing mission to provide you with exceptional heart care, we have created designated Provider Care Teams.  These Care Teams include your primary Cardiologist (physician) and Advanced Practice Providers (APPs -  Physician Assistants and Nurse Practitioners) who all work together to provide you with the care you need, when you need it.  Your next appointment:   1 month(s)  The format for your next appointment:   In Person  Provider:   Robert Krasowski, MD  Other Instructions  

## 2019-09-27 NOTE — Progress Notes (Signed)
Cardiology Office Note:    Date:  09/27/2019   ID:  Peter Thompson, DOB Sep 25, 1965, MRN [id number]  PCP:  Antonietta Jewel, MD  Cardiologist:  Peter Campus, MD    Referring MD: Antonietta Jewel, MD   Chief Complaint  Patient presents with  . Follow-up    History of Present Illness:    Peter Thompson is a 54 y.o. male with past medical history significant for atrial fibrillation which now appears to be persistent.  He did undergo one-time cardioversion, however very quickly after that he went back to atrial fibrillation.  Plan was to initiate antiarrhythmic therapy and try to convert him again with antiarrhythmic on board however in the meantime we discovered he gets significant sleep apnea.  Recently started being treated for sleep apnea with CPAP and he feels much better.  He said he finally can sleep more than 30 minutes and he is overall very satisfied with the therapy.  I brought in today to my office to talk about potentially doing cardioversion again however today he tells me he does not want to do it.  I told him I want to talk about this to him more I explained to him procedure explained to him risk and benefits he said he will think it over.  Therefore, I will see him back in my office in about 1 month to continue this discussion.  Past Medical History:  Diagnosis Date  . Anxiety   . Arthritis   . Bursitis   . Complication of anesthesia    "woke up in OR- IV came out."  . Concussion    several- head injury- was in [place]  . DJD (degenerative joint disease)   . Hypertension   . Insomnia   . Neck fracture ([place])   . Tinnitus, bilateral     Past Surgical History:  Procedure Laterality Date  . ANKLE SURGERY Left   . Arm surgery Right    fracture repair  . CERVICAL SPINE SURGERY     bone graft from left hip  . HAND SURGERY Left    drains for Infection  . HIP SURGERY Left    "scrapped" x2  . KNEE ARTHROSCOPY Right   . LEG SURGERY Left    "1 inch took out"  .  SHOULDER ARTHROSCOPY Left 12/27/2016   Procedure: ARTHROSCOPY SHOULDER;  Surgeon: Melrose Nakayama, MD;  Location: [place];  Service: Orthopedics;  Laterality: Left;  Debridement, AC Decompression, Acromioplasty   . TOTAL HIP ARTHROPLASTY Left 05/10/2016   Procedure: TOTAL HIP ARTHROPLASTY ANTERIOR APPROACH;  Surgeon: Melrose Nakayama, MD;  Location: [place];  Service: Orthopedics;  Laterality: Left;  . TOTAL KNEE ARTHROPLASTY Right 10/02/2018   Procedure: TOTAL KNEE ARTHROPLASTY;  Surgeon: Melrose Nakayama, MD;  Location: [place];  Service: Orthopedics;  Laterality: Right;  . WRIST SURGERY Right    2 Pinns    Current Medications: Current Meds  Medication Sig  . amLODipine (NORVASC) 10 MG tablet Take 10 mg by mouth daily.  Marland Kitchen apixaban (ELIQUIS) 5 MG TABS tablet Take 1 tablet (5 mg total) by mouth 2 (two) times daily.  . cloNIDine (CATAPRES) 0.2 MG tablet Take 0.2 mg by mouth 3 (three) times daily.  . furosemide (LASIX) 20 MG tablet Take 40 mg by mouth every morning.  . hydrALAZINE (APRESOLINE) 100 MG tablet Take 100 mg by mouth 3 (three) times daily.  Marland Kitchen labetalol (NORMODYNE) 300 MG tablet Take 1 tablet (300 mg total) by mouth 2 (two) times daily.  Marland Kitchen  lisinopril-hydrochlorothiazide (PRINZIDE,ZESTORETIC) 20-25 MG tablet Take 1 tablet by mouth daily.  . Multiple Vitamins-Minerals (MULTIVITAMIN WITH MINERALS) tablet Take 1 tablet by mouth daily.  Marland Kitchen oxyCODONE 20 MG TABS Take 1 tablet (20 mg total) by mouth every 4 (four) hours as needed (severe pain). (Patient taking differently: Take 20 mg by mouth every 4 (four) hours. )  . potassium chloride (K-DUR) 10 MEQ tablet Take 10 mEq by mouth daily.     Allergies:   Patient has no known allergies.   Social History   Socioeconomic History  . Marital status: Legally Separated    Spouse name: Not on file  . Number of children: Not on file  . Years of education: Not on file  . Highest education level: Not on file  Occupational History  . Not on file  Tobacco  Use  . Smoking status: Former Smoker    Packs/day: 0.50    Years: 56.00    Pack years: 28.00    Types: Cigarettes  . Smokeless tobacco: Former Neurosurgeon    Types: Chew  . Tobacco comment: occasional - chew  Substance and Sexual Activity  . Alcohol use: Yes    Alcohol/week: 12.0 standard drinks    Types: 12 Cans of beer per week  . Drug use: No  . Sexual activity: Not on file  Other Topics Concern  . Not on file  Social History Narrative  . Not on file   Social Determinants of Health   Financial Resource Strain:   . Difficulty of Paying Living Expenses: Not on file  Food Insecurity:   . Worried About Programme researcher, broadcasting/film/video in the Last Year: Not on file  . Ran Out of Food in the Last Year: Not on file  Transportation Needs:   . Lack of Transportation (Medical): Not on file  . Lack of Transportation (Non-Medical): Not on file  Physical Activity:   . Days of Exercise per Week: Not on file  . Minutes of Exercise per Session: Not on file  Stress:   . Feeling of Stress : Not on file  Social Connections:   . Frequency of Communication with Friends and Family: Not on file  . Frequency of Social Gatherings with Friends and Family: Not on file  . Attends Religious Services: Not on file  . Active Member of Clubs or Organizations: Not on file  . Attends Banker Meetings: Not on file  . Marital Status: Not on file     Family History: The patient's family history is negative for Heart disease. ROS:   Please see the history of present illness.    All 14 point review of systems negative except as described per history of present illness  EKGs/Labs/Other Studies Reviewed:      Recent Labs: 10/03/2018: Magnesium 2.0; TSH 0.495 10/10/2018: BUN 8; Creatinine, Ser 0.58; Potassium 3.9; Sodium 128 10/17/2018: Hemoglobin 9.8; Platelets 404  Recent Lipid Panel No results found for: CHOL, TRIG, HDL, CHOLHDL, VLDL, LDLCALC, LDLDIRECT  Physical Exam:    VS:  BP (!) 142/82   Pulse  (!) 51   Ht 5\' 9"  (1.753 m)   Wt 261 lb (118.4 kg)   SpO2 94%   BMI 38.54 kg/m     Wt Readings from Last 3 Encounters:  09/27/19 261 lb (118.4 kg)  09/04/19 261 lb 9.6 oz (118.7 kg)  06/06/19 247 lb (112 kg)     GEN:  Well nourished, well developed in no acute distress HEENT: Normal NECK: No  JVD; No carotid bruits LYMPHATICS: No lymphadenopathy CARDIAC: RRR, no murmurs, no rubs, no gallops RESPIRATORY:  Clear to auscultation without rales, wheezing or rhonchi  ABDOMEN: Soft, non-tender, non-distended MUSCULOSKELETAL:  No edema; No deformity  SKIN: Warm and dry LOWER EXTREMITIES: no swelling NEUROLOGIC:  Alert and oriented x 3 PSYCHIATRIC:  Normal affect   ASSESSMENT:    1. Persistent atrial fibrillation (HCC)   2. Obstructive sleep apnea    PLAN:    In order of problems listed above:  1. Persistent atrial fibrillation have some doubts about having cardioversion done.  And now I hope with the proper management of sleep apnea get better chances to succeeding.  We will also talk about potentially using antiarrhythmic therapy but it is not interested in cardioversion there is no role for this medication.  I will continue anticoagulation.  I will bring him back to my office in 1 month to continue this discussion at that time we need to make a decision about which way we want to go rhythm versus rate control. 2. Obstructive sleep apnea managed with CPAP he is very satisfied and happy 3. Arthritis in multiple joints.  He is an Consulting civil engineer.   Medication Adjustments/Labs and Tests Ordered: Current medicines are reviewed at length with the patient today.  Concerns regarding medicines are outlined above.  No orders of the defined types were placed in this encounter.  Medication changes: No orders of the defined types were placed in this encounter.   Signed, Georgeanna Lea, MD, [facility] 09/27/2019 3:54 PM    [facility]

## 2019-10-01 ENCOUNTER — Other Ambulatory Visit: Payer: Self-pay | Admitting: Cardiology

## 2019-10-28 ENCOUNTER — Ambulatory Visit: Payer: Medicaid Other | Admitting: Cardiology

## 2020-01-22 ENCOUNTER — Other Ambulatory Visit: Payer: Self-pay | Admitting: Cardiology

## 2020-03-24 ENCOUNTER — Other Ambulatory Visit: Payer: Self-pay | Admitting: Orthopaedic Surgery

## 2020-03-27 ENCOUNTER — Telehealth: Payer: Self-pay | Admitting: Emergency Medicine

## 2020-03-27 NOTE — Telephone Encounter (Signed)
   [facility] Pre-operative Risk Assessment    HEARTCARE STAFF: - Please ensure there is not already an duplicate clearance open for this procedure. - Under Visit Info/Reason for Call, type in Other and utilize the format Clearance MM/DD/YY or Clearance TBD. Do not use dashes or single digits. - If request is for dental extraction, please clarify the # of teeth to be extracted.  Request for surgical clearance:  1. What type of surgery is being performed? Right Knee arthroscopy    2. When is this surgery scheduled? 04/14/2020   3. What type of clearance is required (medical clearance vs. Pharmacy clearance to hold med vs. Both)? both  4. Are there any medications that need to be held prior to surgery and how long? None specified   5. Practice name and name of physician performing surgery? [facility], Melrose Nakayama, MD   6. What is the office phone number? [phone]   7.   What is the office fax number? [phone]  8.   Anesthesia type (None, local, MAC, general) choice    R  03/27/2020, 11:08 AM  _________________________________________________________________   (provider comments below)

## 2020-03-27 NOTE — Telephone Encounter (Signed)
   Primary Cardiologist:Robert Bing Matter, MD  Chart reviewed as part of pre-operative protocol coverage. Because of Padraic C Parma's past medical history and time since last visit, they will require a follow-up visit in order to better assess preoperative cardiovascular risk.  At last OV in January 2021 there was plan for patient to consider cardioversion and return to the office in 1 month to discuss next steps for atrial fibrillation, rate vs rhythm control. Patient no-showed for visit in February, please reschedule. This will better inform plans for surgical recommendations.  Pre-op covering staff: - Please schedule appointment and call patient to inform them - Please contact requesting surgeon's office via preferred method (i.e, phone, fax) to inform them of need for appointment prior to surgery.   Laurann Montana, PA-C  03/27/2020, 12:22 PM

## 2020-03-27 NOTE — Telephone Encounter (Signed)
Left message for the pt to call back and schedule a pre op appt  With Dr. Bing Matter.

## 2020-03-31 NOTE — Telephone Encounter (Signed)
2n attempt to reach pt re: needing an appt for surgical clearance.  Left another message for pt to call back.

## 2020-03-31 NOTE — Telephone Encounter (Signed)
Pt is scheduled to see Dr. Servando Salina 7/30.  Will route back to the requesting surgeon's office to make them aware.

## 2020-04-03 ENCOUNTER — Telehealth: Payer: Self-pay | Admitting: Cardiology

## 2020-04-03 NOTE — Telephone Encounter (Signed)
i have Peter Thompson and wife on the phone and the are requesting to speak with a nurse about his current appt. PCP says that he will not release pat until an earlier appt time is set. Ultrasound is on 04/14/20. Please call back

## 2020-04-03 NOTE — Telephone Encounter (Signed)
Lets get more information exactly what kind of surgery he is going to have.  Because one time he supposed to have some surgery and we thought he was too high risk.  On top of that since I am on vacation next week I am not sure if I will be able to see him until following week.  It looks like surgery scheduled for 04/14/2020.

## 2020-04-03 NOTE — Telephone Encounter (Signed)
Spoke to the patient and patients wife just now. She let me know that he is scheduled for surgery on 04/14/20 and they are needing a sooner appointment than the appointment that they currently have scheduled for 04/10/20, one week from today. I told them that I would send a message to Dr. Servando Salina to see what we could do but she is currently fully booked for office visits at this time.

## 2020-04-06 NOTE — Telephone Encounter (Signed)
He needs to be seen, we have not seen him for more than 6 months

## 2020-04-06 NOTE — Telephone Encounter (Signed)
Called spoke to patient. Informed him we have nothing sooner at this time for an appointment. He verbally understood and plans to come this Friday as scheduled.

## 2020-04-08 NOTE — Patient Instructions (Signed)
DUE TO COVID-19 ONLY ONE VISITOR IS ALLOWED TO COME WITH YOU AND STAY IN THE WAITING ROOM ONLY DURING PRE OP AND PROCEDURE DAY OF SURGERY. THE 1 VISITOR MAY VISIT WITH YOU AFTER SURGERY IN YOUR PRIVATE ROOM DURING VISITING HOURS ONLY!  YOU NEED TO HAVE A COVID 19 TEST ON: 04/10/20 @           , THIS TEST MUST BE DONE BEFORE SURGERY, COME  [address], [place] , [address].  [facility]) , ONCE YOUR COVID TEST IS COMPLETED,  PLEASE BEGIN THE QUARANTINE INSTRUCTIONS AS OUTLINED IN YOUR HANDOUT.                Peter Thompson     Your procedure is scheduled on: 04/14/20   Report to [facility] Main  Entrance   Report to admitting at: 8:20 AM     Call this number if you have problems the morning of surgery [phone]    Remember:   NO SOLID FOOD AFTER MIDNIGHT THE NIGHT PRIOR TO SURGERY. NOTHING BY MOUTH EXCEPT CLEAR LIQUIDS UNTIL: 7:50 am . PLEASE FINISH ENSURE DRINK PER SURGEON ORDER  WHICH NEEDS TO BE COMPLETED AT: 7:50 am .   CLEAR LIQUID DIET   Foods Allowed                                                                     Foods Excluded  Coffee and tea, regular and decaf                             liquids that you cannot  Plain Jell-O any favor except red or purple                                           see through such as: Fruit ices (not with fruit pulp)                                     milk, soups, orange juice  Iced Popsicles                                    All solid food Carbonated beverages, regular and diet                                    Cranberry, grape and apple juices Sports drinks like Gatorade Lightly seasoned clear broth or consume(fat free) Sugar, honey syrup  Sample Menu Breakfast                                Lunch  Supper Cranberry juice                    Beef broth                            Chicken broth Jell-O                                     Grape juice                            Apple juice Coffee or tea                        Jell-O                                      Popsicle                                                Coffee or tea                        Coffee or tea  _____________________________________________________________________   BRUSH YOUR TEETH MORNING OF SURGERY AND RINSE YOUR MOUTH OUT, NO CHEWING GUM CANDY OR MINTS.     Take these medicines the morning of surgery with A SIP OF WATER: amlodipine,hydralazine,clonidine.                                You may not have any metal on your body including hair pins and              piercings  Do not wear jewelry, lotions, powders or perfumes, deodorant             Men may shave face and neck.   Do not bring valuables to the hospital. [facility] IS NOT             RESPONSIBLE   FOR VALUABLES.  Contacts, dentures or bridgework may not be worn into surgery.  Leave suitcase in the car. After surgery it may be brought to your room.     Patients discharged the day of surgery will not be allowed to drive home. IF YOU ARE HAVING SURGERY AND GOING HOME THE SAME DAY, YOU MUST HAVE AN ADULT TO DRIVE YOU HOME AND BE WITH YOU FOR 24 HOURS. YOU MAY GO HOME BY TAXI OR UBER OR ORTHERWISE, BUT AN ADULT MUST ACCOMPANY YOU HOME AND STAY WITH YOU FOR 24 HOURS.  Name and phone number of your driver:  Special Instructions: N/A              Please read over the following fact sheets you were given: _____________________________________________________________________          [facility] - Preparing for Surgery Before surgery, you can play an important role.  Because skin is not sterile, your skin needs to be as free of germs as possible.  You can reduce the number of germs on your skin by washing with  CHG (chlorahexidine gluconate) soap before surgery.  CHG is an antiseptic cleaner which kills germs and bonds with the skin to continue killing germs even after washing. Please DO NOT use if you have an  allergy to CHG or antibacterial soaps.  If your skin becomes reddened/irritated stop using the CHG and inform your nurse when you arrive at Short Stay. Do not shave (including legs and underarms) for at least 48 hours prior to the first CHG shower.  You may shave your face/neck. Please follow these instructions carefully:  1.  Shower with CHG Soap the night before surgery and the  morning of Surgery.  2.  If you choose to wash your hair, wash your hair first as usual with your  normal  shampoo.  3.  After you shampoo, rinse your hair and body thoroughly to remove the  shampoo.                           4.  Use CHG as you would any other liquid soap.  You can apply chg directly  to the skin and wash                       Gently with a scrungie or clean washcloth.  5.  Apply the CHG Soap to your body ONLY FROM THE NECK DOWN.   Do not use on face/ open                           Wound or open sores. Avoid contact with eyes, ears mouth and genitals (private parts).                       Wash face,  Genitals (private parts) with your normal soap.             6.  Wash thoroughly, paying special attention to the area where your surgery  will be performed.  7.  Thoroughly rinse your body with warm water from the neck down.  8.  DO NOT shower/wash with your normal soap after using and rinsing off  the CHG Soap.                9.  Pat yourself dry with a clean towel.            10.  Wear clean pajamas.            11.  Place clean sheets on your bed the night of your first shower and do not  sleep with pets. Day of Surgery : Do not apply any lotions/deodorants the morning of surgery.  Please wear clean clothes to the hospital/surgery center.  FAILURE TO FOLLOW THESE INSTRUCTIONS MAY RESULT IN THE CANCELLATION OF YOUR SURGERY PATIENT SIGNATURE_________________________________  NURSE  SIGNATURE__________________________________  ________________________________________________________________________   Peter Thompson  An incentive spirometer is a tool that can help keep your lungs clear and active. This tool measures how well you are filling your lungs with each breath. Taking long deep breaths may help reverse or decrease the chance of developing breathing (pulmonary) problems (especially infection) following:  A long period of time when you are unable to move or be active. BEFORE THE PROCEDURE   If the spirometer includes an indicator to show your best effort, your nurse or respiratory therapist will set it to a desired goal.  If possible, sit up straight or  lean slightly forward. Try not to slouch.  Hold the incentive spirometer in an upright position. INSTRUCTIONS FOR USE  1. Sit on the edge of your bed if possible, or sit up as far as you can in bed or on a chair. 2. Hold the incentive spirometer in an upright position. 3. Breathe out normally. 4. Place the mouthpiece in your mouth and seal your lips tightly around it. 5. Breathe in slowly and as deeply as possible, raising the piston or the ball toward the top of the column. 6. Hold your breath for 3-5 seconds or for as long as possible. Allow the piston or ball to fall to the bottom of the column. 7. Remove the mouthpiece from your mouth and breathe out normally. 8. Rest for a few seconds and repeat Steps 1 through 7 at least 10 times every 1-2 hours when you are awake. Take your time and take a few normal breaths between deep breaths. 9. The spirometer may include an indicator to show your best effort. Use the indicator as a goal to work toward during each repetition. 10. After each set of 10 deep breaths, practice coughing to be sure your lungs are clear. If you have an incision (the cut made at the time of surgery), support your incision when coughing by placing a pillow or rolled up towels firmly  against it. Once you are able to get out of bed, walk around indoors and cough well. You may stop using the incentive spirometer when instructed by your caregiver.  RISKS AND COMPLICATIONS  Take your time so you do not get dizzy or light-headed.  If you are in pain, you may need to take or ask for pain medication before doing incentive spirometry. It is harder to take a deep breath if you are having pain. AFTER USE  Rest and breathe slowly and easily.  It can be helpful to keep track of a log of your progress. Your caregiver can provide you with a simple table to help with this. If you are using the spirometer at home, follow these instructions: [place] IF:   You are having difficultly using the spirometer.  You have trouble using the spirometer as often as instructed.  Your pain medication is not giving enough relief while using the spirometer.  You develop fever of 100.5 F (38.1 C) or higher. SEEK IMMEDIATE MEDICAL CARE IF:   You cough up bloody sputum that had not been present before.  You develop fever of 102 F (38.9 C) or greater.  You develop worsening pain at or near the incision site. MAKE SURE YOU:   Understand these instructions.  Will watch your condition.  Will get help right away if you are not doing well or get worse. Document Released: 01/09/2007 Document Revised: 11/21/2011 Document Reviewed: 03/12/2007 [facility] Patient Information 2014 [place], [place].   ________________________________________________________________________

## 2020-04-09 ENCOUNTER — Encounter (HOSPITAL_COMMUNITY)
Admission: RE | Admit: 2020-04-09 | Discharge: 2020-04-09 | Disposition: A | Discharge status: Home / Self Care | Payer: Medicaid Other | Source: Ambulatory Visit | Attending: Orthopaedic Surgery | Admitting: Orthopaedic Surgery

## 2020-04-10 ENCOUNTER — Encounter: Payer: Self-pay | Admitting: Cardiology

## 2020-04-10 ENCOUNTER — Other Ambulatory Visit: Payer: Self-pay

## 2020-04-10 ENCOUNTER — Ambulatory Visit (INDEPENDENT_AMBULATORY_CARE_PROVIDER_SITE_OTHER): Payer: Medicaid Other | Admitting: Cardiology

## 2020-04-10 VITALS — BP 130/72 | HR 76 | Ht 69.0 in | Wt 233.0 lb

## 2020-04-10 DIAGNOSIS — G4733 Obstructive sleep apnea (adult) (pediatric): Secondary | ICD-10-CM

## 2020-04-10 DIAGNOSIS — I10 Essential (primary) hypertension: Secondary | ICD-10-CM

## 2020-04-10 DIAGNOSIS — I4819 Other persistent atrial fibrillation: Secondary | ICD-10-CM

## 2020-04-10 DIAGNOSIS — Z01810 Encounter for preprocedural cardiovascular examination: Secondary | ICD-10-CM | POA: Diagnosis not present

## 2020-04-10 NOTE — Patient Instructions (Addendum)
Medication Instructions:  Your physician recommends that you continue on your current medications as directed. Please refer to the Current Medication list given to you today.  *If you need a refill on your cardiac medications before your next appointment, please call your pharmacy*   Lab Work: None.  If you have labs (blood work) drawn today and your tests are completely normal, you will receive your results only by: Marland Kitchen MyChart Message (if you have MyChart) OR . A paper copy in the mail If you have any lab test that is abnormal or we need to change your treatment, we will call you to review the results.   Testing/Procedures:   [facility] [address] [place], [place] [address] Phone:  [phone]    Please arrive 15 minutes prior to your appointment time for registration and insurance purposes.  The test will take approximately 3 to 4 hours to complete; you may bring reading material.  If someone comes with you to your appointment, they will need to remain in the main lobby due to limited space in the testing area. **If you are pregnant or breastfeeding, please notify the nuclear lab prior to your appointment**  How to prepare for your Myocardial Perfusion Test: . Do not eat or drink 3 hours prior to your test, except you may have water. . Do not consume products containing caffeine (regular or decaffeinated) 12 hours prior to your test. (ex: coffee, chocolate, sodas, tea). . Hold labetolol the morning of the test.  . Do wear comfortable clothes (no dresses or overalls) and walking shoes, tennis shoes preferred (No heels or open toe shoes are allowed). . Do NOT wear cologne, perfume, aftershave, or lotions (deodorant is allowed). . If these instructions are not followed, your test will have to be rescheduled.  Please report to [address] for your test.  If you have questions or concerns about your appointment, you can call the [facility]  [place] [facility] at [phone].  If you cannot keep your appointment, please provide 24 hours notification to the Nuclear Lab, to avoid a possible $50 charge to your account.    Follow-Up: At [facility], you and your health needs are our priority.  As part of our continuing mission to provide you with exceptional heart care, we have created designated Provider Care Teams.  These Care Teams include your primary Cardiologist (physician) and Advanced Practice Providers (APPs -  Physician Assistants and Nurse Practitioners) who all work together to provide you with the care you need, when you need it.  We recommend signing up for the patient portal called "MyChart".  Sign up information is provided on this After Visit Summary.  MyChart is used to connect with patients for Virtual Visits (Telemedicine).  Patients are able to view lab/test results, encounter notes, upcoming appointments, etc.  Non-urgent messages can be sent to your provider as well.   To learn more about what you can do with MyChart, go to [email or web address].    Your next appointment:   3 month(s)  The format for your next appointment:   In Person  Provider:   Gypsy Balsam, MD   Other Instructions   Cardiac Nuclear Scan A cardiac nuclear scan is a test that measures blood flow to the heart when a person is resting and when he or she is exercising. The test looks for problems such as:  Not enough blood reaching a portion of the heart.  The heart muscle not  working normally. You may need this test if:  You have heart disease.  You have had abnormal lab results.  You have had heart surgery or a balloon procedure to open up blocked arteries (angioplasty).  You have chest pain.  You have shortness of breath. In this test, a radioactive dye (tracer) is injected into your bloodstream. After the tracer has traveled to your heart, an imaging device is used to measure how much of the tracer is  absorbed by or distributed to various areas of your heart. This procedure is usually done at a hospital and takes 2-4 hours. Tell a health care provider about:  Any allergies you have.  All medicines you are taking, including vitamins, herbs, eye drops, creams, and over-the-counter medicines.  Any problems you or family members have had with anesthetic medicines.  Any blood disorders you have.  Any surgeries you have had.  Any medical conditions you have.  Whether you are pregnant or may be pregnant. What are the risks? Generally, this is a safe procedure. However, problems may occur, including:  Serious chest pain and heart attack. This is only a risk if the stress portion of the test is done.  Rapid heartbeat.  Sensation of warmth in your chest. This usually passes quickly.  Allergic reaction to the tracer. What happens before the procedure?  Ask your health care provider about changing or stopping your regular medicines. This is especially important if you are taking diabetes medicines or blood thinners.  Follow instructions from your health care provider about eating or drinking restrictions.  Remove your jewelry on the day of the procedure. What happens during the procedure?  An IV will be inserted into one of your veins.  Your health care provider will inject a small amount of radioactive tracer through the IV.  You will wait for 20-40 minutes while the tracer travels through your bloodstream.  Your heart activity will be monitored with an electrocardiogram (ECG).  You will lie down on an exam table.  Images of your heart will be taken for about 15-20 minutes.  You may also have a stress test. For this test, one of the following may be done: ? You will exercise on a treadmill or stationary bike. While you exercise, your heart's activity will be monitored with an ECG, and your blood pressure will be checked. ? You will be given medicines that will increase blood  flow to parts of your heart. This is done if you are unable to exercise.  When blood flow to your heart has peaked, a tracer will again be injected through the IV.  After 20-40 minutes, you will get back on the exam table and have more images taken of your heart.  Depending on the type of tracer used, scans may need to be repeated 3-4 hours later.  Your IV line will be removed when the procedure is over. The procedure may vary among health care providers and hospitals. What happens after the procedure?  Unless your health care provider tells you otherwise, you may return to your normal schedule, including diet, activities, and medicines.  Unless your health care provider tells you otherwise, you may increase your fluid intake. This will help to flush the contrast dye from your body. Drink enough fluid to keep your urine pale yellow.  Ask your health care provider, or the department that is doing the test: ? When will my results be ready? ? How will I get my results? Summary  A cardiac nuclear  scan measures the blood flow to the heart when a person is resting and when he or she is exercising.  Tell your health care provider if you are pregnant.  Before the procedure, ask your health care provider about changing or stopping your regular medicines. This is especially important if you are taking diabetes medicines or blood thinners.  After the procedure, unless your health care provider tells you otherwise, increase your fluid intake. This will help flush the contrast dye from your body.  After the procedure, unless your health care provider tells you otherwise, you may return to your normal schedule, including diet, activities, and medicines. This information is not intended to replace advice given to you by your health care provider. Make sure you discuss any questions you have with your health care provider. Document Revised: 02/12/2018 Document Reviewed: 02/12/2018 [employer] Patient  Education  2020 [employer].

## 2020-04-10 NOTE — Progress Notes (Signed)
Cardiology Office Note:    Date:  04/10/2020   ID:  Peter Thompson, DOB 05-25-1966, MRN [id number]  PCP:  Quitman LivingsHassan, Sami, MD  Cardiologist:  Gypsy Balsamobert Krasowski, MD  Electrophysiologist:  None   Referring MD: Quitman LivingsHassan, Sami, MD     History of Present Illness:    Peter Thompson is a 54 y.o. male with a hx of  Atrial fibrillation on Eliquis, sleep apnea on CPAP who usually follows with Dr. Mauri BrooklynKraswoski and was last seen by him in January 2021. At that time did discuss DC cardioversion but per the notes the patient did not want to proceed with that and discuss all show possible initiation of anti arrhythmics.  The patient requested to be seen therefore he was placed in my schedule given the fact that his primary cardiologist is on vacation.  Past Medical History:  Diagnosis Date  . Anxiety   . Arthritis   . Bursitis   . Complication of anesthesia    "woke up in OR- IV came out."  . Concussion    several- head injury- was in milatary  . DJD (degenerative joint disease)   . Hypertension   . Insomnia   . Neck fracture (HCC)   . Tinnitus, bilateral     Past Surgical History:  Procedure Laterality Date  . ANKLE SURGERY Left   . Arm surgery Right    fracture repair  . CERVICAL SPINE SURGERY     bone graft from left hip  . HAND SURGERY Left    drains for Infection  . HIP SURGERY Left    "scrapped" x2  . KNEE ARTHROSCOPY Right   . LEG SURGERY Left    "1 inch took out"  . SHOULDER ARTHROSCOPY Left 12/27/2016   Procedure: ARTHROSCOPY SHOULDER;  Surgeon: Marcene CorningPeter Dalldorf, MD;  [facility];  Service: Orthopedics;  Laterality: Left;  Debridement, AC Decompression, Acromioplasty   . TOTAL HIP ARTHROPLASTY Left 05/10/2016   Procedure: TOTAL HIP ARTHROPLASTY ANTERIOR APPROACH;  Surgeon: Marcene CorningPeter Dalldorf, MD;  Location: [facility] OR;  Service: Orthopedics;  Laterality: Left;  . TOTAL KNEE ARTHROPLASTY Right 10/02/2018   Procedure: TOTAL KNEE ARTHROPLASTY;  Surgeon: Marcene Corningalldorf, Peter, MD;  Location:  [facility] OR;  Service: Orthopedics;  Laterality: Right;  . WRIST SURGERY Right    2 Pinns    Current Medications: Current Meds  Medication Sig  . amLODipine (NORVASC) 10 MG tablet Take 10 mg by mouth daily.  . cloNIDine (CATAPRES) 0.2 MG tablet Take 0.2 mg by mouth 3 (three) times daily.  Marland Kitchen. ELIQUIS 5 MG TABS tablet TAKE 1 TABLET BY MOUTH TWICE DAILY  . furosemide (LASIX) 20 MG tablet Take 40 mg by mouth every morning.  . furosemide (LASIX) 20 MG tablet Take 80 mg by mouth daily. Take for 1 week per Dr. Kirtland BouchardK  . hydrALAZINE (APRESOLINE) 100 MG tablet Take 100 mg by mouth 3 (three) times daily.  Marland Kitchen. labetalol (NORMODYNE) 300 MG tablet Take 1 tablet (300 mg total) by mouth 2 (two) times daily.  Marland Kitchen. lisinopril-hydrochlorothiazide (PRINZIDE,ZESTORETIC) 20-25 MG tablet Take 1 tablet by mouth daily.  . Multiple Vitamins-Minerals (MULTIVITAMIN WITH MINERALS) tablet Take 1 tablet by mouth daily.  Marland Kitchen. oxyCODONE 20 MG TABS Take 1 tablet (20 mg total) by mouth every 4 (four) hours as needed (severe pain).  . potassium chloride (K-DUR) 10 MEQ tablet Take 10 mEq by mouth daily.     Allergies:   Patient has no known allergies.   Social History   Socioeconomic History  .  Marital status: Legally Separated    Spouse name: Not on file  . Number of children: Not on file  . Years of education: Not on file  . Highest education level: Not on file  Occupational History  . Not on file  Tobacco Use  . Smoking status: Current Every Day Smoker    Packs/day: 0.50    Years: 56.00    Pack years: 28.00    Types: Cigarettes  . Smokeless tobacco: Former Neurosurgeon    Types: Chew  . Tobacco comment: occasional - chew  Substance and Sexual Activity  . Alcohol use: Yes    Alcohol/week: 12.0 standard drinks    Types: 12 Cans of beer per week  . Drug use: No  . Sexual activity: Not on file  Other Topics Concern  . Not on file  Social History Narrative  . Not on file   Social Determinants of Health   Financial Resource  Strain:   . Difficulty of Paying Living Expenses:   Food Insecurity:   . Worried About Programme researcher, broadcasting/film/video in the Last Year:   . Barista in the Last Year:   Transportation Needs:   . Freight forwarder (Medical):   Marland Kitchen Lack of Transportation (Non-Medical):   Physical Activity:   . Days of Exercise per Week:   . Minutes of Exercise per Session:   Stress:   . Feeling of Stress :   Social Connections:   . Frequency of Communication with Friends and Family:   . Frequency of Social Gatherings with Friends and Family:   . Attends Religious Services:   . Active Member of Clubs or Organizations:   . Attends Banker Meetings:   Marland Kitchen Marital Status:      Family History: The patient's family history is negative for Heart disease.  ROS:   Review of Systems  Constitution: Negative for decreased appetite, fever and weight gain.  HENT: Negative for congestion, ear discharge, hoarse voice and sore throat.   Eyes: Negative for discharge, redness, vision loss in right eye and visual halos.  Cardiovascular: Negative for chest pain, dyspnea on exertion, leg swelling, orthopnea and palpitations.  Respiratory: Negative for cough, hemoptysis, shortness of breath and snoring.   Endocrine: Negative for heat intolerance and polyphagia.  Hematologic/Lymphatic: Negative for bleeding problem. Does not bruise/bleed easily.  Skin: Negative for flushing, nail changes, rash and suspicious lesions.  Musculoskeletal: Negative for arthritis, joint pain, muscle cramps, myalgias, neck pain and stiffness.  Gastrointestinal: Negative for abdominal pain, bowel incontinence, diarrhea and excessive appetite.  Genitourinary: Negative for decreased libido, genital sores and incomplete emptying.  Neurological: Negative for brief paralysis, focal weakness, headaches and loss of balance.  Psychiatric/Behavioral: Negative for altered mental status, depression and suicidal ideas.  Allergic/Immunologic:  Negative for HIV exposure and persistent infections.    EKGs/Labs/Other Studies Reviewed:    The following studies were reviewed today:   EKG: None today.   Recent Labs: No results found for requested labs within last 8760 hours.  Recent Lipid Panel No results found for: CHOL, TRIG, HDL, CHOLHDL, VLDL, LDLCALC, LDLDIRECT  Physical Exam:    VS:  BP (!) 130/72 (BP Location: Left Arm, Patient Position: Sitting, Cuff Size: Normal)   Pulse 76   Ht 5\' 9"  (1.753 m)   Wt (!) 233 lb (105.7 kg)   SpO2 97%   BMI 34.41 kg/m     Wt Readings from Last 3 Encounters:  04/10/20 (!) 233 lb (105.7  kg)  09/27/19 261 lb (118.4 kg)  09/04/19 261 lb 9.6 oz (118.7 kg)     GEN: Well nourished, well developed in no acute distress HEENT: Normal NECK: No JVD; No carotid bruits LYMPHATICS: No lymphadenopathy CARDIAC: S1S2 noted,RRR, no murmurs, rubs, gallops RESPIRATORY:  Clear to auscultation without rales, wheezing or rhonchi  ABDOMEN: Soft, non-tender, non-distended, +bowel sounds, no guarding. EXTREMITIES: No edema, No cyanosis, no clubbing MUSCULOSKELETAL:  No deformity  SKIN: Warm and dry NEUROLOGIC:  Alert and oriented x 3, non-focal PSYCHIATRIC:  Normal affect, good insight  ASSESSMENT:    1. Preop cardiovascular exam   2. Persistent atrial fibrillation (HCC)   3. Essential hypertension   4. Obstructive sleep apnea    PLAN:    The patient is here for preoperative examination, he is sedentary due to his injury for the last several months.  He does have risk factors.  He did have a stress test back in 2018 which is unavailable.  Given his provocative exam it would be of great benefit given his sedentary lifestyle and his risk factor to proceed with a preoperative testing for his surgery.  I have educated patient about pharmacologic nuclear stress test he is agreeable to proceed with this testing.  If his stress test is normal the patient can proceed with his surgery at which time he  will need to hold his Eliquis for 6 full doses ( 3 days prior to surgery).  Then, restart his Eliquis minimal 12 hours post surgery at the discretion of the physician performing the procedure.  He has his surgery scheduled for May 09, 2020.  In terms of the atrial fibrillation now continue the patient on his rate control agent as well as his Eliquis 5 mg twice daily.  OSA-continue with CPAP   The patient is in agreement with the above plan. The patient left the office in stable condition.  The patient will follow up in 3 months with Dr. Bing Matter   Medication Adjustments/Labs and Tests Ordered: Current medicines are reviewed at length with the patient today.  Concerns regarding medicines are outlined above.  Orders Placed This Encounter  Procedures  . MYOCARDIAL PERFUSION IMAGING   No orders of the defined types were placed in this encounter.   Patient Instructions  Medication Instructions:  Your physician recommends that you continue on your current medications as directed. Please refer to the Current Medication list given to you today.  *If you need a refill on your cardiac medications before your next appointment, please call your pharmacy*   Lab Work: None.  If you have labs (blood work) drawn today and your tests are completely normal, you will receive your results only by: Marland Kitchen MyChart Message (if you have MyChart) OR . A paper copy in the mail If you have any lab test that is abnormal or we need to change your treatment, we will call you to review the results.   Testing/Procedures:   [facility] [address] [place], [place] [address] Phone:  [phone]    Please arrive 15 minutes prior to your appointment time for registration and insurance purposes.  The test will take approximately 3 to 4 hours to complete; you may bring reading material.  If someone comes with you to your appointment, they will need to remain in the main lobby  due to limited space in the testing area. **If you are pregnant or breastfeeding, please notify the nuclear lab prior to your appointment**  How to  prepare for your Myocardial Perfusion Test: . Do not eat or drink 3 hours prior to your test, except you may have water. . Do not consume products containing caffeine (regular or decaffeinated) 12 hours prior to your test. (ex: coffee, chocolate, sodas, tea). . Do wear comfortable clothes (no dresses or overalls) and walking shoes, tennis shoes preferred (No heels or open toe shoes are allowed). . Do NOT wear cologne, perfume, aftershave, or lotions (deodorant is allowed). . If these instructions are not followed, your test will have to be rescheduled.  Please report to [address] for your test.  If you have questions or concerns about your appointment, you can call the [facility] at [phone].  If you cannot keep your appointment, please provide 24 hours notification to the Nuclear Lab, to avoid a possible $50 charge to your account.    Follow-Up: At [facility], you and your health needs are our priority.  As part of our continuing mission to provide you with exceptional heart care, we have created designated Provider Care Teams.  These Care Teams include your primary Cardiologist (physician) and Advanced Practice Providers (APPs -  Physician Assistants and Nurse Practitioners) who all work together to provide you with the care you need, when you need it.  We recommend signing up for the patient portal called "MyChart".  Sign up information is provided on this After Visit Summary.  MyChart is used to connect with patients for Virtual Visits (Telemedicine).  Patients are able to view lab/test results, encounter notes, upcoming appointments, etc.  Non-urgent messages can be sent to your provider as well.   To learn more about what you can do with MyChart, go to [email or web address].    Your next  appointment:   3 month(s)  The format for your next appointment:   In Person  Provider:   Gypsy Balsam, MD   Other Instructions   Cardiac Nuclear Scan A cardiac nuclear scan is a test that measures blood flow to the heart when a person is resting and when he or she is exercising. The test looks for problems such as:  Not enough blood reaching a portion of the heart.  The heart muscle not working normally. You may need this test if:  You have heart disease.  You have had abnormal lab results.  You have had heart surgery or a balloon procedure to open up blocked arteries (angioplasty).  You have chest pain.  You have shortness of breath. In this test, a radioactive dye (tracer) is injected into your bloodstream. After the tracer has traveled to your heart, an imaging device is used to measure how much of the tracer is absorbed by or distributed to various areas of your heart. This procedure is usually done at a hospital and takes 2-4 hours. Tell a health care provider about:  Any allergies you have.  All medicines you are taking, including vitamins, herbs, eye drops, creams, and over-the-counter medicines.  Any problems you or family members have had with anesthetic medicines.  Any blood disorders you have.  Any surgeries you have had.  Any medical conditions you have.  Whether you are pregnant or may be pregnant. What are the risks? Generally, this is a safe procedure. However, problems may occur, including:  Serious chest pain and heart attack. This is only a risk if the stress portion of the test is done.  Rapid heartbeat.  Sensation of warmth in your chest. This usually passes quickly.  Allergic reaction to the tracer. What happens before the procedure?  Ask your health care provider about changing or stopping your regular medicines. This is especially important if you are taking diabetes medicines or blood thinners.  Follow instructions from your  health care provider about eating or drinking restrictions.  Remove your jewelry on the day of the procedure. What happens during the procedure?  An IV will be inserted into one of your veins.  Your health care provider will inject a small amount of radioactive tracer through the IV.  You will wait for 20-40 minutes while the tracer travels through your bloodstream.  Your heart activity will be monitored with an electrocardiogram (ECG).  You will lie down on an exam table.  Images of your heart will be taken for about 15-20 minutes.  You may also have a stress test. For this test, one of the following may be done: ? You will exercise on a treadmill or stationary bike. While you exercise, your heart's activity will be monitored with an ECG, and your blood pressure will be checked. ? You will be given medicines that will increase blood flow to parts of your heart. This is done if you are unable to exercise.  When blood flow to your heart has peaked, a tracer will again be injected through the IV.  After 20-40 minutes, you will get back on the exam table and have more images taken of your heart.  Depending on the type of tracer used, scans may need to be repeated 3-4 hours later.  Your IV line will be removed when the procedure is over. The procedure may vary among health care providers and hospitals. What happens after the procedure?  Unless your health care provider tells you otherwise, you may return to your normal schedule, including diet, activities, and medicines.  Unless your health care provider tells you otherwise, you may increase your fluid intake. This will help to flush the contrast dye from your body. Drink enough fluid to keep your urine pale yellow.  Ask your health care provider, or the department that is doing the test: ? When will my results be ready? ? How will I get my results? Summary  A cardiac nuclear scan measures the blood flow to the heart when a person  is resting and when he or she is exercising.  Tell your health care provider if you are pregnant.  Before the procedure, ask your health care provider about changing or stopping your regular medicines. This is especially important if you are taking diabetes medicines or blood thinners.  After the procedure, unless your health care provider tells you otherwise, increase your fluid intake. This will help flush the contrast dye from your body.  After the procedure, unless your health care provider tells you otherwise, you may return to your normal schedule, including diet, activities, and medicines. This information is not intended to replace advice given to you by your health care provider. Make sure you discuss any questions you have with your health care provider. Document Revised: 02/12/2018 Document Reviewed: 02/12/2018 Elsevier Patient Education  2020 [employer].      Adopting a Healthy Lifestyle.  Know what a healthy weight is for you (roughly BMI <25) and aim to maintain this   Aim for 7+ servings of fruits and vegetables daily   65-80+ fluid ounces of water or unsweet tea for healthy kidneys   Limit to max 1 drink of alcohol per day; avoid smoking/tobacco   Limit animal  fats in diet for cholesterol and heart health - choose grass fed whenever available   Avoid highly processed foods, and foods high in saturated/trans fats   Aim for low stress - take time to unwind and care for your mental health   Aim for 150 min of moderate intensity exercise weekly for heart health, and weights twice weekly for bone health   Aim for 7-9 hours of sleep daily   When it comes to diets, agreement about the perfect plan isnt easy to find, even among the experts. Experts at the [facility] of [employer] developed an idea known as the Healthy Eating Plate. Just imagine a plate divided into logical, healthy portions.   The emphasis is on diet quality:   Load up on vegetables and  fruits - one-half of your plate: Aim for color and variety, and remember that potatoes dont count.   Go for whole grains - one-quarter of your plate: Whole wheat, barley, wheat berries, quinoa, oats, brown rice, and foods made with them. If you want pasta, go with whole wheat pasta.   Protein power - one-quarter of your plate: Fish, chicken, beans, and nuts are all healthy, versatile protein sources. Limit red meat.   The diet, however, does go beyond the plate, offering a few other suggestions.   Use healthy plant oils, such as olive, canola, soy, corn, sunflower and peanut. Check the labels, and avoid partially hydrogenated oil, which have unhealthy trans fats.   If youre thirsty, drink water. Coffee and tea are good in moderation, but skip sugary drinks and limit milk and dairy products to one or two daily servings.   The type of carbohydrate in the diet is more important than the amount. Some sources of carbohydrates, such as vegetables, fruits, whole grains, and beans-are healthier than others.   Finally, stay active  Signed, Thomasene Ripple, DO  04/10/2020 3:22 PM    [facility]

## 2020-04-15 ENCOUNTER — Telehealth: Payer: Self-pay | Admitting: *Deleted

## 2020-04-15 NOTE — Telephone Encounter (Signed)
Patient given detailed instructions per Myocardial Perfusion Study Information Sheet for the test on 04/22/2020 at 0815. Patient notified to arrive 15 minutes early and that it is imperative to arrive on time for appointment to keep from having the test rescheduled.  If you need to cancel or reschedule your appointment, please call the office within 24 hours of your appointment. . Patient verbalized understanding., Peter Thompson No mychart available

## 2020-04-22 NOTE — Patient Instructions (Signed)
DUE TO COVID-19 ONLY ONE VISITOR IS ALLOWED TO COME WITH YOU AND STAY IN THE WAITING ROOM ONLY DURING PRE OP AND PROCEDURE.   IF YOU WILL BE ADMITTED INTO THE HOSPITAL YOU ARE ALLOWED ONE SUPPORT PERSON DURING VISITATION HOURS ONLY (10AM -8PM)   . The support person may change daily. . The support person must pass our screening, gel in and out, and wear a mask at all times, including in the patient's room. . Patients must also wear a mask when staff or their support person are in the room.   COVID SWAB TESTING MUST BE COMPLETED ON:  Friday, 05-01-2020 @    [address] [place], [place] [address]  (Must self quarantine after testing. Follow instructions on handout.)        Your procedure is scheduled on:  Tuesday, 05-05-2020   Report to [facility] Main  Entrance   Report to Short Stay at 5:30 AM   [facility])      Call this number if you have problems the morning of surgery [phone]     Do not eat food :After Midnight.   May have liquids until 4:30 AM   day of surgery  CLEAR LIQUID DIET  Foods Allowed                                                                     Foods Excluded  Water, Black Coffee and tea, regular and decaf             liquids that you cannot  Plain Jell-O in any flavor  (No red)                                     see through such as: Fruit ices (not with fruit pulp)                                      milk, soups, orange juice              Iced Popsicles (No red)                                      All solid food                                   Apple juices Sports drinks like Gatorade (No red) Lightly seasoned clear broth or consume(fat free)  Sugar, honey syrup   Complete one Ensure drink the morning of surgery at 4:30 AM  the day of surgery.    Oral Hygiene is also important to reduce your risk of infection.                                     Remember - BRUSH YOUR TEETH THE MORNING OF SURGERY WITH YOUR REGULAR  TOOTHPASTE   Do  NOT smoke after Midnight   Take these medicines the morning of surgery with A SIP OF WATER: Amlodipine, Clonidine, Labetalol, Hydralazine, Oxycodone              You may not have any metal on your body including jewelry, and body piercings               Do not wear lotions, powders, perfumes/cologne, or deodorant                          Men may shave face and neck.   Do not bring valuables to the hospital. [facility] IS NOT  RESPONSIBLE   FOR VALUABLES.   Contacts, dentures or bridgework may not be worn into surgery.   Bring small overnight bag day of surgery.      Special Instructions: Bring a copy of your healthcare power of attorney and living will documents  the day of surgery if you haven't scanned them in before.                Please read over the following fact sheets you were given: IF YOU HAVE QUESTIONS ABOUT YOUR PRE OP INSTRUCTIONS PLEASE CALL [phone]   [place] - Preparing for Surgery Before surgery, you can play an important role.  Because skin is not sterile, your skin needs to be as free of germs as possible.  You can reduce the number of germs on your skin by washing with CHG (chlorahexidine gluconate) soap before surgery.  CHG is an antiseptic cleaner which kills germs and bonds with the skin to continue killing germs even after washing. Please DO NOT use if you have an allergy to CHG or antibacterial soaps.  If your skin becomes reddened/irritated stop using the CHG and inform your nurse when you arrive at Short Stay. Do not shave (including legs and underarms) for at least 48 hours prior to the first CHG shower.  You may shave your face/neck.  Please follow these instructions carefully:  1.  Shower with CHG Soap the night before surgery and the  morning of surgery.  2.  If you choose to wash your hair, wash your hair first as usual with your normal  shampoo.  3.  After you shampoo, rinse your hair and body thoroughly to remove the  shampoo.                             4.  Use CHG as you would any other liquid soap.  You can apply chg directly to the skin and wash.  Gently with a scrungie or clean washcloth.  5.  Apply the CHG Soap to your body ONLY FROM THE NECK DOWN.   Do   not use on face/ open                           Wound or open sores. Avoid contact with eyes, ears mouth and   genitals (private parts).                       Wash face,  Genitals (private parts) with your normal soap.             6.  Wash thoroughly, paying special attention to the area where your    surgery  will be performed.  7.  Thoroughly rinse your  body with warm water from the neck down.  8.  DO NOT shower/wash with your normal soap after using and rinsing off the CHG Soap.                9.  Pat yourself dry with a clean towel.            10.  Wear clean pajamas.            11.  Place clean sheets on your bed the night of your first shower and do not  sleep with pets. Day of Surgery : Do not apply any lotions/deodorants the morning of surgery.  Please wear clean clothes to the hospital/surgery center.  FAILURE TO FOLLOW THESE INSTRUCTIONS MAY RESULT IN THE CANCELLATION OF YOUR SURGERY  PATIENT SIGNATURE_________________________________  NURSE SIGNATURE__________________________________  ________________________________________________________________________

## 2020-04-22 NOTE — Progress Notes (Signed)
COVID Vaccine Completed: Date COVID Vaccine completed: COVID vaccine manufacturer: Pfizer    [facility] [employer]   PCP - Quitman Livings, MD Cardiologist - Gypsy Balsam, MD.  Last OV 04-10-20  Chest x-ray -  EKG -  Stress Test - 2018 ECHO - 10-03-18 in Epic Cardiac Cath -   Sleep Study - 06-30-19 in Epic CPAP - Yes  Fasting Blood Sugar -  Checks Blood Sugar _____ times a day  Blood Thinner Instructions: Eliquis 5 mg.  Stop 3 days prior per cardiology Aspirin Instructions: Last Dose:  Anesthesia review: OSA, Afib,   Patient denies shortness of breath, fever, cough and chest pain at PAT appointment   Patient verbalized understanding of instructions that were given to them at the PAT appointment. Patient was also instructed that they will need to review over the PAT instructions again at home before surgery.

## 2020-04-29 ENCOUNTER — Encounter (HOSPITAL_COMMUNITY)
Admission: RE | Admit: 2020-04-29 | Discharge: 2020-04-29 | Disposition: A | Discharge status: Home / Self Care | Payer: Medicaid Other | Source: Ambulatory Visit | Attending: Orthopaedic Surgery | Admitting: Orthopaedic Surgery

## 2020-05-05 ENCOUNTER — Ambulatory Visit (HOSPITAL_COMMUNITY): Admission: RE | Admit: 2020-05-05 | Payer: Medicaid Other | Source: Home / Self Care | Admitting: Orthopaedic Surgery

## 2020-05-05 ENCOUNTER — Encounter (HOSPITAL_COMMUNITY): Admission: RE | Payer: Self-pay | Source: Home / Self Care

## 2020-05-05 SURGERY — ARTHROSCOPY, KNEE
Anesthesia: Choice | Site: Knee | Laterality: Left

## 2020-05-14 ENCOUNTER — Other Ambulatory Visit: Payer: Self-pay | Admitting: Cardiology

## 2020-05-22 ENCOUNTER — Encounter: Payer: Self-pay | Admitting: Cardiology

## 2020-07-15 DIAGNOSIS — M1990 Unspecified osteoarthritis, unspecified site: Secondary | ICD-10-CM | POA: Insufficient documentation

## 2020-07-15 DIAGNOSIS — I10 Essential (primary) hypertension: Secondary | ICD-10-CM | POA: Insufficient documentation

## 2020-07-15 DIAGNOSIS — H9313 Tinnitus, bilateral: Secondary | ICD-10-CM | POA: Insufficient documentation

## 2020-07-15 DIAGNOSIS — S060X9A Concussion with loss of consciousness of unspecified duration, initial encounter: Secondary | ICD-10-CM | POA: Insufficient documentation

## 2020-07-15 DIAGNOSIS — G4700 Insomnia, unspecified: Secondary | ICD-10-CM | POA: Insufficient documentation

## 2020-07-15 DIAGNOSIS — S060XAA Concussion with loss of consciousness status unknown, initial encounter: Secondary | ICD-10-CM | POA: Insufficient documentation

## 2020-07-15 DIAGNOSIS — T8859XA Other complications of anesthesia, initial encounter: Secondary | ICD-10-CM | POA: Insufficient documentation

## 2020-07-15 DIAGNOSIS — M719 Bursopathy, unspecified: Secondary | ICD-10-CM | POA: Insufficient documentation

## 2020-07-15 DIAGNOSIS — S129XXA Fracture of neck, unspecified, initial encounter: Secondary | ICD-10-CM | POA: Insufficient documentation

## 2020-07-15 DIAGNOSIS — F419 Anxiety disorder, unspecified: Secondary | ICD-10-CM | POA: Insufficient documentation

## 2020-07-16 ENCOUNTER — Other Ambulatory Visit: Payer: Self-pay

## 2020-07-16 ENCOUNTER — Encounter: Payer: Self-pay | Admitting: Cardiology

## 2020-07-16 ENCOUNTER — Ambulatory Visit (INDEPENDENT_AMBULATORY_CARE_PROVIDER_SITE_OTHER): Payer: Medicaid Other | Admitting: Cardiology

## 2020-07-16 VITALS — BP 120/60 | HR 75 | Ht 69.0 in | Wt 264.6 lb

## 2020-07-16 DIAGNOSIS — I4821 Permanent atrial fibrillation: Secondary | ICD-10-CM

## 2020-07-16 DIAGNOSIS — I10 Essential (primary) hypertension: Secondary | ICD-10-CM

## 2020-07-16 DIAGNOSIS — I4819 Other persistent atrial fibrillation: Secondary | ICD-10-CM

## 2020-07-16 DIAGNOSIS — F17200 Nicotine dependence, unspecified, uncomplicated: Secondary | ICD-10-CM | POA: Diagnosis not present

## 2020-07-16 NOTE — Patient Instructions (Signed)

## 2020-07-17 DIAGNOSIS — I4821 Permanent atrial fibrillation: Secondary | ICD-10-CM | POA: Insufficient documentation

## 2020-07-17 DIAGNOSIS — I4891 Unspecified atrial fibrillation: Secondary | ICD-10-CM | POA: Insufficient documentation

## 2020-07-17 NOTE — Progress Notes (Signed)
Cardiology Office Note:    Date:  07/17/2020   ID:  Peter Thompson, DOB 1965-12-26, MRN [id number]  PCP:  Quitman Livings, MD  Cardiologist:  Gypsy Balsam, MD    Referring MD: Quitman Livings, MD   No chief complaint on file. I am doing fine  History of Present Illness:    Peter Thompson is a 54 y.o. male with past medical history significant for permanent atrial fibrillation.  He did have cardioversion for however very quickly back to a atrial fibrillation.  Then there was an issue of obstructive sleep apnea this finally is managed appropriately however now he does not want to be converted to sinus rhythm.  Denies have any palpitations no shortness of breath no chest pain tightness squeezing pressure burning chest.  Discussion today revolve around pain in all of his joints.  He is an Consulting civil engineer and he exercise heart ended all difficulties with his life.  Now he is suffering great deal of pain in different joints.  He has been receiving narcotics for that.  Cardiac wise seems to be doing well.  Past Medical History:  Diagnosis Date  . Anemia 10/17/2018  . Anxiety   . Arthritis   . Bursitis   . Complication of anesthesia    "woke up in OR- IV came out."  . Concussion    several- head injury- was in milatary  . DJD (degenerative joint disease)   . Essential hypertension 10/17/2018  . Hypertension   . Insomnia   . Left-sided epistaxis 01/30/2019  . Neck fracture (HCC)   . Obstructive sleep apnea 09/04/2019  . Paroxysmal atrial fibrillation (HCC) 12/26/2018  . Persistent atrial fibrillation (HCC)   . Pre-op evaluation 12/05/2016  . Primary localized osteoarthritis of left hip 05/10/2016  . Primary osteoarthritis of left hip 05/10/2016  . Primary osteoarthritis of right knee 10/02/2018  . Smoking 12/05/2016  . Tinnitus, bilateral     Past Surgical History:  Procedure Laterality Date  . ANKLE SURGERY Left   . Arm surgery Right    fracture repair  . CERVICAL SPINE SURGERY     bone  graft from left hip  . HAND SURGERY Left    drains for Infection  . HIP SURGERY Left    "scrapped" x2  . KNEE ARTHROSCOPY Right   . LEG SURGERY Left    "1 inch took out"  . SHOULDER ARTHROSCOPY Left 12/27/2016   Procedure: ARTHROSCOPY SHOULDER;  Surgeon: Marcene Corning, MD;  Location[facility];  Service: Orthopedics;  Laterality: Left;  Debridement, AC Decompression, Acromioplasty   . TOTAL HIP ARTHROPLASTY Left 05/10/2016   Procedure: TOTAL HIP ARTHROPLASTY ANTERIOR APPROACH;  Surgeon: Marcene Corning, MD;  Location: [facility] OR;  Service: Orthopedics;  Laterality: Left;  . TOTAL KNEE ARTHROPLASTY Right 10/02/2018   Procedure: TOTAL KNEE ARTHROPLASTY;  Surgeon: Marcene Corning, MD;  Location: [facility] OR;  Service: Orthopedics;  Laterality: Right;  . WRIST SURGERY Right    2 Pinns    Current Medications: Current Meds  Medication Sig  . amLODipine (NORVASC) 10 MG tablet Take 10 mg by mouth daily.  . cloNIDine (CATAPRES) 0.2 MG tablet Take 0.2 mg by mouth 3 (three) times daily.  Marland Kitchen ELIQUIS 5 MG TABS tablet TAKE 1 TABLET BY MOUTH TWICE DAILY  . furosemide (LASIX) 20 MG tablet Take 80 mg by mouth daily. Take for 1 week per Dr. Kirtland Bouchard  . hydrALAZINE (APRESOLINE) 100 MG tablet Take 100 mg by mouth 3 (three) times daily.  Marland Kitchen labetalol (  NORMODYNE) 300 MG tablet Take 1 tablet (300 mg total) by mouth 2 (two) times daily.  Marland Kitchen lisinopril-hydrochlorothiazide (PRINZIDE,ZESTORETIC) 20-25 MG tablet Take 1 tablet by mouth daily.  . Multiple Vitamins-Minerals (MULTIVITAMIN WITH MINERALS) tablet Take 1 tablet by mouth daily.  Marland Kitchen oxyCODONE 20 MG TABS Take 1 tablet (20 mg total) by mouth every 4 (four) hours as needed (severe pain).  . potassium chloride (K-DUR) 10 MEQ tablet Take 10 mEq by mouth daily.     Allergies:   Patient has no known allergies.   Social History   Socioeconomic History  . Marital status: Legally Separated    Spouse name: Not on file  . Number of children: Not on file  . Years of education: Not on  file  . Highest education level: Not on file  Occupational History  . Not on file  Tobacco Use  . Smoking status: Current Every Day Smoker    Packs/day: 0.50    Years: 56.00    Pack years: 28.00    Types: Cigarettes  . Smokeless tobacco: Former Neurosurgeon    Types: Chew  . Tobacco comment: occasional - chew  Substance and Sexual Activity  . Alcohol use: Yes    Alcohol/week: 12.0 standard drinks    Types: 12 Cans of beer per week  . Drug use: No  . Sexual activity: Not on file  Other Topics Concern  . Not on file  Social History Narrative  . Not on file   Social Determinants of Health   Financial Resource Strain:   . Difficulty of Paying Living Expenses: Not on file  Food Insecurity:   . Worried About Programme researcher, broadcasting/film/video in the Last Year: Not on file  . Ran Out of Food in the Last Year: Not on file  Transportation Needs:   . Lack of Transportation (Medical): Not on file  . Lack of Transportation (Non-Medical): Not on file  Physical Activity:   . Days of Exercise per Week: Not on file  . Minutes of Exercise per Session: Not on file  Stress:   . Feeling of Stress : Not on file  Social Connections:   . Frequency of Communication with Friends and Family: Not on file  . Frequency of Social Gatherings with Friends and Family: Not on file  . Attends Religious Services: Not on file  . Active Member of Clubs or Organizations: Not on file  . Attends Banker Meetings: Not on file  . Marital Status: Not on file     Family History: The patient's family history is negative for Heart disease. ROS:   Please see the history of present illness.    All 14 point review of systems negative except as described per history of present illness  EKGs/Labs/Other Studies Reviewed:    EKG shows atrial fibrillation with controlled ventricular rate nonspecific ST segment changes  Recent Labs: No results found for requested labs within last 8760 hours.  Recent Lipid Panel No  results found for: CHOL, TRIG, HDL, CHOLHDL, VLDL, LDLCALC, LDLDIRECT  Physical Exam:    VS:  BP 120/60   Pulse 75   Ht 5\' 9"  (1.753 m)   Wt 264 lb 9.6 oz (120 kg)   SpO2 95%   BMI 39.07 kg/m     Wt Readings from Last 3 Encounters:  07/16/20 264 lb 9.6 oz (120 kg)  04/10/20 (!) 233 lb (105.7 kg)  09/27/19 261 lb (118.4 kg)     GEN:  Well nourished, well  developed in no acute distress HEENT: Normal NECK: No JVD; No carotid bruits LYMPHATICS: No lymphadenopathy CARDIAC: RRR, no murmurs, no rubs, no gallops RESPIRATORY:  Clear to auscultation without rales, wheezing or rhonchi  ABDOMEN: Soft, non-tender, non-distended MUSCULOSKELETAL:  No edema; No deformity  SKIN: Warm and dry LOWER EXTREMITIES: no swelling NEUROLOGIC:  Alert and oriented x 3 PSYCHIATRIC:  Normal affect   ASSESSMENT:    1. Persistent atrial fibrillation (HCC)   2. Permanent atrial fibrillation (HCC)   3. Essential hypertension   4. Smoking    PLAN:    In order of problems listed above:  1. Permanent atrial fibrillation.  He does not want to be converted to sinus rhythm.  He is anticoagulated which I will continue.  Even though his chads 2 Vascor is only 1 because of high blood pressure but he prefers to continue.  He is AV block pain associated with labetalol.  And is controlled. 2. Essential hypertension blood pressure today very well controlled. 3. Smoking still an issue and he understand he need to quit.  Cholesterol status unknown.  I did look at his K PN however there is no recent fasting lipid profile we will schedule him to have the test.   Medication Adjustments/Labs and Tests Ordered: Current medicines are reviewed at length with the patient today.  Concerns regarding medicines are outlined above.  Orders Placed This Encounter  Procedures  . EKG 12-Lead   Medication changes: No orders of the defined types were placed in this encounter.   Signed, Georgeanna Lea, MD, [facility] 07/17/2020  8:05 AM    [facility]

## 2020-10-05 NOTE — Progress Notes (Signed)
[facility]    Provider:  Dr Lucia Gaskins Requesting Provider: Quitman Livings, MD Primary Care Provider:  Quitman Livings, MD  CC:  Headaches 24x7  HPI:  Peter Thompson is a 55 y.o. male here as requested by Quitman Livings, MD for worsening headaches for 2 months. PMHx afib, smoking, OSA 09/04/2019. He has severe sleep apnea managed by pulmonary team (compliance?). I received hand-written notes from Dr. Roseanne Reno that I cannot read whatsoever.I spoke with patient prior to appointment(10 minutes)to ask if he can bring the cpap or if it had a chip or if he knew the doctor who manages it, he states he only sleeps 3 hours, he uses the cpap every night, he can;t remember who the doctor it, it does not have a chip. He also says he has had 19 surgeries and he has headaches 24x7 relieved briefly by oxycodone.   He is here today alone. Headaches started 5 months, never had headaches before. He uses his cpap 1-4 hours a night, he is in a lot of pain, he has chronic pain 14 surgeries various areas including the neck and pelvis nad knee and others. He is in chronic pain management, Dr. Roseanne Reno manages those, he gets neck injections from Dr. Modesto Charon. He says he has a wire in his neck, no inciting events, he did not fall or hit his head, they are in the back of the head, radiates from the neck to the back of the head, pressure, shoots up his neck and the back of the head occ, mostly pressure, does not spread to the frontal areas, no light or sound sensitivity, no nausea or vomiting, his pain killers help shortly, pain is constant in the setting of constant neck pain. No new meds, no illnesses, no head trauma no inciting events to explain, unclear of started acutely, he can't think straight, can't remember words, the pain is severe 7/10, constant, no vision changes, bending over makes it worse.   meds tried that can be used in headache management(from a review of records): tylenol, norvasc, asa, clonidine,  oopioids(chronic use), labetalol, lisinopril, robaxin, reglan, metoprolol, naproxen, nortriptyline, zofran,   Reviewed notes, labs and imaging from outside physicians, which showed:   MRI cervical spine 11/21/2017:reviewed report   FINDINGS:  . Bones: Marrow edema within the bilateral lamina of C2 and within the left articulating facet of C3  . Spinal cord: Normal size, contour, and signal without evidence of mass or demyelinating lesion  . Alignment: Anatomic without subluxation.  . Degenerative changes:  . C1-C2: No significant central canal or neural foraminal stenosis.  . C2-C3: Severe left facet arthrosis with periarticular marrow and soft tissue edema. Moderately severe left foraminal stenosis.Marland Kitchen Postinflammatory or postsurgical fusion of the right facet joint.  . C3-C4: Fusion of the right facet joint. No definite stenosis.  Marland Kitchen C4-C5: Status post laminectomy. Metallic artifact posteriorly obscures the central canal. No definite stenosis.  Marland Kitchen C5-C6: Status post laminectomy. Metallic artifact posteriorly obscures the central canal. No definite stenosis. Fusion of the bilateral facet joints  . C6-C7: No significant central canal or neural foraminal stenosis. Fusion of the bilateral facet joints  . C7-T1: No significant central canal or neural foraminal stenosis.  . Additional findings: None     IMPRESSION:  Facet arthrosis on the left at C2-3 with periarticular marrow edema and joint effusion suggesting a degree of inflammation or instability.. This may also be a source of localized neck pain and/or left C3radiculopathy   Marrow edema within the  bilateral C2 lamina concerning for stress fractures. CT of the neck would be useful for further evaluation.   Postsurgical changes of laminectomy centered at the C5 level. No definite complicating features.   CT cervical spine 01/16/2018: FINDINGS: reviewed images Alignment: Normal.  Skull base and vertebrae: No fracture or osseous lesion.  Interbody and posterior element ankylosis at C3-4, C5-6, and C6-7. Cerclage wire around the base of the C5 and C6 spinous processes.  Soft tissues and spinal canal: No prevertebral fluid or swelling. 10 mm sebaceous cyst in the posterior neck left of midline at the C2-3 level. Moderate carotid artery calcified atherosclerosis.  Disc levels:  C2-3: Moderate to severe left facet arthrosis without stenosis.  C3-4: Moderate osseous neural foraminal stenosis on the right. No spinal stenosis.  C4-5: Mild left facet arthrosis without stenosis.  C5-6: No stenosis.  C6-7: No stenosis.  C7-T1: Posterior element spurring and ligamentous calcification without significant stenosis.  Upper chest: Clear lung apices.  Other: None.  IMPRESSION: 1. 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 2. Moderate right neural foraminal stenosis at C3-4. 3. Advanced left facet arthrosis at C2-3 without stenosis.   Review of Systems: Patient complains of symptoms per HPI as well as the following symptoms: chronic pain . Pertinent negatives and positives per HPI. All others negative.   Social History   Socioeconomic History  . Marital status: Legally Separated    Spouse name: Not on file  . Number of children: 0  . Years of education: Not on file  . Highest education level: Some college, no degree  Occupational History  . Not on file  Tobacco Use  . Smoking status: Current Every Day Smoker    Packs/day: 0.50    Years: 56.00    Pack years: 28.00    Types: Cigarettes  . Smokeless tobacco: Former NeurosurgeonUser    Types: Chew  . Tobacco comment: smokes less than 0.5 ppd now  Vaping Use  . Vaping Use: Never used  Substance and Sexual Activity  . Alcohol use: Yes    Alcohol/week: 12.0 standard drinks    Types: 12 Cans of beer per week  . Drug use: No  . Sexual activity: Not on file  Other Topics Concern  . Not on file  Social History Narrative   Lives alone   "ambidextrous, write with my  right hand"   Caffeine: 2 cups coffee/day   Social Determinants of Health   Financial Resource Strain: Not on file  Food Insecurity: Not on file  Transportation Needs: Not on file  Physical Activity: Not on file  Stress: Not on file  Social Connections: Not on file  Intimate Partner Violence: Not on file    Family History  Problem Relation Age of Onset  . Cancer Other   . Heart disease Neg Hx   . Headache Neg Hx   . Migraines Neg Hx     Past Medical History:  Diagnosis Date  . Anemia 10/17/2018  . Anxiety   . Arthritis   . Bursitis   . Complication of anesthesia    "woke up in OR- IV came out."  . Concussion    several- head injury- was in milatary  . DJD (degenerative joint disease)   . Essential hypertension 10/17/2018  . Hypertension   . Insomnia   . Left-sided epistaxis 01/30/2019  . Neck fracture (HCC)   . Obstructive sleep apnea 09/04/2019  . Paroxysmal atrial fibrillation (HCC) 12/26/2018  . Persistent atrial fibrillation (HCC)   .  Pre-op evaluation 12/05/2016  . Primary localized osteoarthritis of left hip 05/10/2016  . Primary osteoarthritis of left hip 05/10/2016  . Primary osteoarthritis of right knee 10/02/2018  . Smoking 12/05/2016  . Tinnitus, bilateral     Patient Active Problem List   Diagnosis Date Noted  . Cervicogenic headache 10/06/2020  . Permanent atrial fibrillation (HCC) 07/17/2020  . Tinnitus, bilateral   . Neck fracture (HCC)   . Insomnia   . Hypertension   . DJD (degenerative joint disease)   . Complication of anesthesia   . Bursitis   . Arthritis   . Anxiety   . Obstructive sleep apnea 09/04/2019  . Left-sided epistaxis 01/30/2019  . Paroxysmal atrial fibrillation (HCC) 12/26/2018  . Essential hypertension 10/17/2018  . Anemia 10/17/2018  . Persistent atrial fibrillation (HCC)   . Primary osteoarthritis of right knee 10/02/2018  . Pre-op evaluation 12/05/2016  . Smoking 12/05/2016  . Primary localized osteoarthritis of left hip  05/10/2016  . Primary osteoarthritis of left hip 05/10/2016    Past Surgical History:  Procedure Laterality Date  . ANKLE SURGERY Left   . Arm surgery Right    fracture repair  . CERVICAL SPINE SURGERY     bone graft from left hip  . HAND SURGERY Left    drains for Infection  . HIP SURGERY Left    "scrapped" x2  . KNEE ARTHROSCOPY Right   . LEG SURGERY Left    "1 inch took out"  . SHOULDER ARTHROSCOPY Left 12/27/2016   Procedure: ARTHROSCOPY SHOULDER;  Surgeon: Marcene Corning, MD;  Location: [facility] OR;  Service: Orthopedics;  Laterality: Left;  Debridement, AC Decompression, Acromioplasty   . TOTAL HIP ARTHROPLASTY Left 05/10/2016   Procedure: TOTAL HIP ARTHROPLASTY ANTERIOR APPROACH;  Surgeon: Marcene Corning, MD;  Location: [facility] OR;  Service: Orthopedics;  Laterality: Left;  . TOTAL KNEE ARTHROPLASTY Right 10/02/2018   Procedure: TOTAL KNEE ARTHROPLASTY;  Surgeon: Marcene Corning, MD;  Location: [facility] OR;  Service: Orthopedics;  Laterality: Right;  . WRIST SURGERY Right    2 Pinns    Current Outpatient Medications  Medication Sig Dispense Refill  . ALPRAZolam (XANAX) 0.25 MG tablet Take 1-2 tabs (0.25mg -0.50mg ) 30-60 minutes before procedure. May repeat if needed.Do not drive. 4 tablet 0  . amLODipine (NORVASC) 10 MG tablet Take 10 mg by mouth daily.    . cloNIDine (CATAPRES) 0.2 MG tablet Take 0.2 mg by mouth 3 (three) times daily.    Marland Kitchen ELIQUIS 5 MG TABS tablet TAKE 1 TABLET BY MOUTH TWICE DAILY 180 tablet 1  . furosemide (LASIX) 20 MG tablet Take 40 mg by mouth every morning.    . furosemide (LASIX) 20 MG tablet Take 80 mg by mouth daily. Take for 1 week per Dr. Kirtland Bouchard    . hydrALAZINE (APRESOLINE) 100 MG tablet Take 100 mg by mouth 3 (three) times daily.    Marland Kitchen labetalol (NORMODYNE) 300 MG tablet Take 1 tablet (300 mg total) by mouth 2 (two) times daily. 60 tablet 0  . lisinopril-hydrochlorothiazide (PRINZIDE,ZESTORETIC) 20-25 MG tablet Take 1 tablet by mouth daily.    . Multiple  Vitamins-Minerals (MULTIVITAMIN WITH MINERALS) tablet Take 1 tablet by mouth daily.    Marland Kitchen oxyCODONE 20 MG TABS Take 1 tablet (20 mg total) by mouth every 4 (four) hours as needed (severe pain). 60 tablet 0  . potassium chloride (K-DUR) 10 MEQ tablet Take 10 mEq by mouth daily.     No current facility-administered medications for this visit.  Allergies as of 10/06/2020  . (No Known Allergies)    Vitals: BP 104/64 (BP Location: Right Arm, Patient Position: Sitting)   Pulse 76   Ht 5\' 9"  (1.753 m)   Wt 272 lb (123.4 kg)   BMI 40.17 kg/m  Last Weight:  Wt Readings from Last 1 Encounters:  10/06/20 272 lb (123.4 kg)   Last Height:   Ht Readings from Last 1 Encounters:  10/06/20 5\' 9"  (1.753 m)     Physical exam: Exam: Gen: NAD, conversant, well nourised, obese, well groomed                     CV: RRR, no MRG. No Carotid Bruits. + LE peripheral edema, warm, nontender Eyes: Conjunctivae clear without exudates or hemorrhage Neck: Dectreased ROM in the cervical spine   Neuro: Detailed Neurologic Exam  Speech:    Speech is normal; fluent and spontaneous with normal comprehension.  Cognition:    The patient is oriented to person, place, and time;     recent and remote memory intact;     language fluent;     normal attention, concentration,     fund of knowledge Cranial Nerves:    The pupils are equal, round, and reactive to light. Pupils too small to visualize fundi. Visual fields are full to finger confrontation. Extraocular movements are intact. Trigeminal sensation is intact and the muscles of mastication are normal. The face is symmetric. The palate elevates in the midline. Hearing intact. Voice is normal. Shoulder shrug is normal. The tongue has normal motion without fasciculations.   Coordination:    No dysmetria or ataxia  Gait:    antalgic Motor Observation:    No asymmetry, no atrophy, and no involuntary movements noted. Tone:    Normal muscle tone.     Posture:    Posture is normal. normal erect    Strength:     Strength is V/V in the upper limbs. Appears intact in the lower extremities but difficult due to pain and swelling.      Sensation: intact to LT     Reflex Exam:  DTR's: Absent AJs, deferred knees due to paina nd swelling, 1+ biceps    Toes:    The toes are downgoing bilaterally.   Clonus:    Clonus is absent.    Assessment/Plan:  55 year old chronic pain patient here for new onset headaches. 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   - Appears to be cervicogenic headache. I provided occipital nerve blocks today, if they help would discuss RFA with Dr. Modesto CharonWong (will send him notes)  HAD MRI in 11/2017, since then no surgeries or new hardware - MRi of the brain w/wo contrast: MRI brain due to concerning symptoms of morning headaches, positional headaches,intractable acute on chronic headache  to look for space occupying mass, hemorrhage or other cause. Neuro exam is non focal. Xanax for the MRI (Do not take with opioids)  -  I would encourage his pcp to follow his severe OSA.  - Could not read written notes provided from requesting physician, would appreciate legible notes from Dr. Ginette OttoHassan's office.  Orders Placed This Encounter  Procedures  . MR BRAIN W WO CONTRAST  . Basic Metabolic Panel   Meds ordered this encounter  Medications  . ALPRAZolam (XANAX) 0.25 MG tablet    Sig: Take 1-2 tabs (0.25mg -0.50mg ) 30-60 minutes before procedure. May repeat if needed.Do not drive.    Dispense:  4 tablet  Refill:  0   Performed by Dr. Lucia Gaskins M.D. in the 30-gauge needle was used. All procedures a documented below were medically necessary, reasonable and appropriate based on the patient's history, medical diagnosis and physician opinion. Verbal informed consent was obtained from the patient, patient was informed of potential risk of procedure, including bruising, bleeding, hematoma formation, infection, muscle weakness,  muscle pain, numbness, transient hypertension, transient hyperglycemia and transient insomnia among others. All areas injected were topically clean with isopropyl rubbing alcohol. Nonsterile nonlatex gloves were worn during the procedure.  1. Greater occipital nerve block [phone]). The greater occipital nerve site was identified at the nuchal line medial to the occipital artery. Medication was injected into the left and right occipital nerve areas and suboccipital areas. Patient's condition is associated with inflammation of the greater occipital nerve and associated multiple groups. Injection was deemed medically necessary, reasonable and appropriate. Injection represents a separate and unique surgical service.  2. Lesser occipital nerve block [phone]). The lesser occipital nerve site was identified approximately 2 cm lateral to the greater occipital nerve. Occasion was injected into the left and right occipital nerve areas. Patient's condition is associated with inflammation of the lesser occipital nerve and associated muscle groups. Injection was deemed medically necessary, reasonable and appropriate. Injection represents a separate and unique surgical service.  Cc: Quitman Livings, MD,  Quitman Livings, MD, Dr. Timmie Foerster, MD  [facility] [address] [place], [place] [address]  Phone [phone] Fax [phone]

## 2020-10-06 ENCOUNTER — Encounter: Payer: Self-pay | Admitting: Neurology

## 2020-10-06 ENCOUNTER — Ambulatory Visit: Payer: Medicaid Other | Admitting: Neurology

## 2020-10-06 ENCOUNTER — Telehealth: Payer: Self-pay | Admitting: Neurology

## 2020-10-06 VITALS — BP 104/64 | HR 76 | Ht 69.0 in | Wt 272.0 lb

## 2020-10-06 DIAGNOSIS — R51 Headache with orthostatic component, not elsewhere classified: Secondary | ICD-10-CM | POA: Diagnosis not present

## 2020-10-06 DIAGNOSIS — R519 Headache, unspecified: Secondary | ICD-10-CM

## 2020-10-06 DIAGNOSIS — G4486 Cervicogenic headache: Secondary | ICD-10-CM

## 2020-10-06 DIAGNOSIS — G8929 Other chronic pain: Secondary | ICD-10-CM

## 2020-10-06 DIAGNOSIS — M5481 Occipital neuralgia: Secondary | ICD-10-CM | POA: Diagnosis not present

## 2020-10-06 HISTORY — DX: Cervicogenic headache: G44.86

## 2020-10-06 MED ORDER — ALPRAZOLAM 0.25 MG PO TABS: ORAL_TABLET | ORAL | 0 refills | Status: DC

## 2020-10-06 NOTE — Telephone Encounter (Signed)
[facility] blue pending. It take take up to 15 business days to hear back on approvals.

## 2020-10-06 NOTE — Progress Notes (Signed)
Nerve block w/o steroid: °Pt signed consent ° °0.5% Bupivocaine 3 mL °LOT: [id number] °EXP: 01/2022 °NDC: 55150-250-50 ° °2% Lidocaine 3 mL °LOT: [id number] °EXP: 11/2021 °NDC: 55150-255-20 ° °

## 2020-10-06 NOTE — Patient Instructions (Addendum)
MRI Brain May discuss with Dr. Modesto Charon to get repeat MRI cervical spine If the nerve blocks today help I recommend Radio Frequency Ablation of the occipital nerves (Dr. Modesto Charon) Xanax for the MRI (Do not take with opioids)  Occipital Nerve Block Patient Information  Description: The occipital nerves originate in the cervical (neck) spinal cord and travel upward through muscle and tissue to supply sensation to the back of the head and top of the scalp.  In addition, the nerves control some of the muscles of the scalp.  Occipital neuralgia is an irritation of these nerves which can cause headaches, numbness of the scalp, and neck discomfort.     The occipital nerve block will interrupt nerve transmission through these nerves and can relieve pain and spasm.  The block consists of insertion of a small needle under the skin in the back of the head to deposit local anesthetic (numbing medicine) and/or steroids around the nerve.  The entire block usually lasts less than 5 minutes.  Conditions which may be treated by occipital blocks:   Muscular pain and spasm of the scalp  Nerve irritation, back of the head  Headaches  Upper neck pain  Preparation for the injection:  1. Do not eat any solid food or dairy products within 8 hours of your appointment. 2. You may drink clear liquids up to 3 hours before appointment.  Clear liquids include water, black coffee, juice or soda.  No milk or cream please. 3. You may take your regular medication, including pain medications, with a sip of water before you appointment.  Diabetics should hold regular insulin (if taken separately) and take 1/2 normal NPH dose the morning of the procedure.  Carry some sugar containing items with you to your appointment. 4. A driver must accompany you and be prepared to drive you home after your procedure. 5. Bring all your current medications with you. 6. An IV may be inserted and sedation may be given at the discretion of the  physician. 7. A blood pressure cuff, EKG, and other monitors will often be applied during the procedure.  Some patients may need to have extra oxygen administered for a short period. 8. You will be asked to provide medical information, including your allergies and medications, prior to the procedure.  We must know immediately if you are taking blood thinners (like Coumadin/Warfarin) or if you are allergic to IV iodine contrast (dye).  We must know if you could possible be pregnant.  9. Do not wear a high collared shirt or turtleneck.  Tie long hair up in the back if possible.  Possible side-effects:   Bleeding from needle site  Infection (rare, may require surgery)  Nerve injury (rare)  Hair on back of neck can be tinged with iodine scrub (this will wash out)  Light-headedness (temporary)  Pain at injection site (several days)  Decreased blood pressure (rare, temporary)  Seizure (very rare)  Call if you experience:   Hives or difficulty breathing ( go to the emergency room)  Inflammation or drainage at the injection site(s)  Please note:  Although the local anesthetic injected can often make your painful muscles or headache feel good for several hours after the injection, the pain may return.  It takes 3-7 days for steroids to work.  You may not notice any pain relief for at least one week.  Alprazolam tablets What is this medicine? ALPRAZOLAM (al PRAY zoe lam) is a benzodiazepine. It is used to treat anxiety and panic  attacks. This medicine may be used for other purposes; ask your health care provider or pharmacist if you have questions. COMMON BRAND NAME(S): Xanax What should I tell my health care provider before I take this medicine? They need to know if you have any of these conditions:  depression or other mental health disease  history of alcohol or medicine abuse or addiction  kidney disease  liver disease  lung disease, asthma, or breathing  problem  seizures  suicidal thoughts, plans or attempt  an unusual or allergic reaction to alprazolam, other benzodiazepines, foods, dyes, or preservatives  pregnant or trying to get pregnant  breast-feeding How should I use this medicine? Take this medicine by mouth. Take it as directed on the prescription label. Do not take it more often than directed. Keep taking it unless your health care provider tells you to stop. A special MedGuide will be given to you by the pharmacist with each prescription and refill. Be sure to read this information carefully each time. Talk to your health care provider about the use of this medicine in children. Special care may be needed. Patients over 78 years of age may have a stronger reaction and need a smaller dose. Overdosage: If you think you have taken too much of this medicine contact a poison control center or emergency room at once. NOTE: This medicine is only for you. Do not share this medicine with others. What if I miss a dose? If you miss a dose, take it as soon as you can. If it is almost time for your next dose, take only that dose. Do not take double or extra doses. What may interact with this medicine? Do not take this medicine with any of the following medications:  certain antivirals for HIV or hepatitis  certain medicines for fungal infections like ketoconazole, itraconazole, or posaconazole  clarithromycin  grapefruit juice  narcotic medicines for cough  sodium oxybate This medicine may also interact with the following medications:  alcohol  antihistamines for allergy, cough and cold  certain medicines for anxiety or sleep  certain medicines for depression like amitriptyline, fluoxetine, fluvoxamine, nefazodone, sertraline  certain medicines for seizures like carbamazepine, phenobarbital, phenytoin, primidone  cimetidine  digoxin  erythromycin  male hormones, like estrogens or progestins and birth control  pills, patches, rings, or injections  general anesthetics like halothane, isoflurane, methoxyflurane, propofol  medicines that relax muscles  narcotic medicines for pain  phenothiazines like chlorpromazine, mesoridazine, prochlorperazine, thioridazine This list may not describe all possible interactions. Give your health care provider a list of all the medicines, herbs, non-prescription drugs, or dietary supplements you use. Also tell them if you smoke, drink alcohol, or use illegal drugs. Some items may interact with your medicine. What should I watch for while using this medicine? Visit your health care provider for regular checks on your progress. Tell your health care provider if your symptoms do not start to get better or if they get worse. Do not stop taking except on your doctor's advice. You may develop a severe reaction. Your doctor will tell you how much medicine to take. You may get drowsy or dizzy. Do not drive, use machinery, or do anything that needs mental alertness until you know how this medicine affects you. To reduce the risk of dizzy and fainting spells, do not stand or sit up quickly, especially if you are an older patient. Alcohol may increase dizziness and drowsiness. Avoid alcoholic drinks. If you are taking another medicine that also causes  drowsiness, you may have more side effects. Give your health care provider a list of all medicines you use. Your doctor will tell you how much medicine to take. Do not take more medicine than directed. Call emergency for help if you have problems breathing or unusual sleepiness. Women should inform their health care provider if they wish to become pregnant or think they might be pregnant. Do not breast-feed while taking this medicine. Talk to your health care provider for more information. What side effects may I notice from receiving this medicine? Side effects that you should report to your doctor or health care professional as soon as  possible:  allergic reactions like skin rash, itching or hives, swelling of the face, lips, or tongue  confusion  loss of balance or coordination  signs and symptoms of low blood pressure like dizziness; feeling faint or lightheaded, falls; unusually weak or tired  suicidal thoughts or other mood changes  trouble breathing  trouble saying things clearly Side effects that usually do not require medical attention (report to your doctor or health care professional if they continue or are bothersome):  changes in sex drive  drowsiness  dry mouth  tiredness This list may not describe all possible side effects. Call your doctor for medical advice about side effects. You may report side effects to FDA at 1-800-FDA-1088. Where should I keep my medicine? Keep out of the reach of children and pets. This medicine can be abused. Keep it in a safe place to protect it from theft. Do not share it with anyone. It is only for you. Selling or giving away this medicine is dangerous and against the law. Store at room temperature between 20 and 25 degrees C (68 and 77 degrees F). Get rid of any unused medicine after the expiration date. This medicine may cause harm and death if it is taken by other adults, children, or pets. It is important to get rid of the medicine as soon as you no longer need it or it is expired. You can do this in two ways:  Take the medicine to a medicine take-back program. Check with your pharmacy or law enforcement to find a location.  If you cannot return the medicine, check the label or package insert to see if the medicine should be thrown out in the garbage or flushed down the toilet. If you are not sure, ask your health care provider. If it is safe to put it in the trash, take the medicine out of the container. Mix the medicine with cat litter, dirt, coffee grounds, or other unwanted substance. Seal the mixture in a bag or container. Put it in the trash. NOTE: This sheet is  a summary. It may not cover all possible information. If you have questions about this medicine, talk to your doctor, pharmacist, or health care provider.  2021 Elsevier/Gold Standard (2020-03-02 15:56:13)

## 2020-10-08 LAB — BASIC METABOLIC PANEL

## 2020-10-08 NOTE — Telephone Encounter (Signed)
Check status it is still pending.

## 2020-10-13 ENCOUNTER — Other Ambulatory Visit: Payer: Self-pay | Admitting: Neurology

## 2020-10-14 NOTE — Telephone Encounter (Signed)
Checked the status on the portal it is still pending.  

## 2020-10-19 NOTE — Telephone Encounter (Signed)
scheduled 2/9 1030AM

## 2020-10-19 NOTE — Telephone Encounter (Signed)
mcd healthy blue Berkley Harvey: [id number] (exp. 10/06/20 to 11/06/20) order faxed to [facility] they will reach out to the patient to schedule.

## 2020-10-20 ENCOUNTER — Telehealth: Payer: Self-pay | Admitting: Neurology

## 2020-10-20 ENCOUNTER — Other Ambulatory Visit: Payer: Self-pay | Admitting: Neurology

## 2020-10-20 MED ORDER — DIAZEPAM 2 MG PO TABS
ORAL_TABLET | ORAL | 0 refills | Status: DC
Start: 2020-10-20 — End: 2020-11-09

## 2020-10-20 NOTE — Telephone Encounter (Signed)
Spoke with patient and advised of Valium instructions including that he should have a driver while on this medication. Pt verbalized understanding and appreciation for the call.

## 2020-10-20 NOTE — Telephone Encounter (Signed)
Pt called, having a MRI tomorrow at 10a. Medication ordered for MRI is not at the pharmacy. Would like to switch to Valium. Would like a call from the nurse.

## 2020-10-20 NOTE — Telephone Encounter (Signed)
Spoke with pt. Xanax transmission to pharmacy failed on 10/06/20. Pt stated he has had multiple MRIs in the past and he remembered he has taken Valium and it helped better. His MRI is at 10 AM tomorrow.

## 2020-10-20 NOTE — Telephone Encounter (Signed)
Done, thanks

## 2020-10-20 NOTE — Telephone Encounter (Signed)
Called pt and LVM asking for call back. When he calls back, please let him know Dr Lucia Gaskins sent Valium to his pharmacy. The instructions are as follows: Take 1-2 tabs by mouth 30- 60 minutes before MRI. May repeat once in one hour if necessary. He will need someone to drive him to and from the MRI and while he is on this medication.

## 2020-10-21 NOTE — Telephone Encounter (Signed)
Peter Thompson with [facility] messaged me and informed me.  "Patient failed study this morning.  He said the meds was not enough for him. He did not rescheduled."

## 2020-10-27 ENCOUNTER — Telehealth: Payer: Self-pay | Admitting: Neurology

## 2020-10-27 NOTE — Telephone Encounter (Signed)
PT asked for nurse to call him regarding his last visit with Dr. Lucia Gaskins

## 2020-10-27 NOTE — Telephone Encounter (Signed)
I called the pt back and LVM asking for call back. Advised office currently closed but opens at 8 AM tomorrow.

## 2020-10-28 NOTE — Telephone Encounter (Signed)
Patient called and has asked to reschedule his MRI there in [facility].

## 2020-10-28 NOTE — Telephone Encounter (Addendum)
Patient called. He is asking to reschedule his MRI there in [facility]. He also asked if Dr Lucia Gaskins would prescribe Xanax if she is only going to give him 2 mg Valium as the last time he had an MRI he took two 10 mg tablets. Pt said he would try the Xanax. I told him I would put in a request to MRI dept and Dr Lucia Gaskins regarding medication. He verbalized appreciation.   Request to reschedule MRI documented in existing MRI phone note and sent to [facility]. This phone note will be sent to Dr Lucia Gaskins.

## 2020-10-31 NOTE — Telephone Encounter (Signed)
We can send in the MRI to [facility] but that will delay his results as they have to mail [place] a copy of the CD. I would prefer if he went to a [facility] facility but it is his choice. When he is scheduled I can send in Xanax, let me know when he is scheduled [facility] Thompson thanks

## 2020-11-02 NOTE — Telephone Encounter (Signed)
Just an fyi  I called the patient and spoke with him and I informed him it is very hard to get the sight location changed for his MRI to go to another location and to try [facility] again for the open MRI and that Dr. Lucia Gaskins would order a higher dose of valium.  He was adamant that he is going to call his insurance and get someone to contact me.. I told him he is more than welcome to do that.

## 2020-11-02 NOTE — Telephone Encounter (Signed)
Tell him we will have ti try it again at [facility] open mri. I am happy to give him a higher dose of valium if he likes.

## 2020-11-02 NOTE — Telephone Encounter (Signed)
Please see phone note from 10/27/20.

## 2020-11-02 NOTE — Telephone Encounter (Signed)
He was scheduled at [facility] for the open MRI but he couldn't do it. But his insurance is very particular on the location and since it is already approved I can't get a hold of anyone from his insurance company to see if the location can be changed.

## 2020-11-04 NOTE — Telephone Encounter (Signed)
Patient called stating he spoke with his insurance about having the MRI in [place] and his insurance told him to just send the order to [place]. I told him I cannot do that until I know the location has been changed and I know I haven't been able to change the location because I have not been able to get a hold of anyone with his insurance.   I started a new case on availity portal since his authorization is about to expire and I started it the case for [facility] as the location.

## 2020-11-09 ENCOUNTER — Other Ambulatory Visit: Payer: Self-pay | Admitting: Neurology

## 2020-11-09 MED ORDER — DIAZEPAM 10 MG PO TABS: ORAL_TABLET | ORAL | 0 refills | Status: DC

## 2020-11-09 NOTE — Telephone Encounter (Signed)
Done, sent in 10mg  valium tables thanks

## 2020-11-09 NOTE — Telephone Encounter (Signed)
Updated mcd healthy blue Berkley Harvey: [id number] (exp. 11/04/20 to 12/04/20) for [facility]

## 2020-11-09 NOTE — Telephone Encounter (Signed)
pt is scheduled at [facility] for Thursday 11/12/20 to arrive at 10:15 am. pt is aware of time & day.   He also stated he needed the Valium sent to his pharmacy.

## 2020-11-10 ENCOUNTER — Other Ambulatory Visit: Payer: Self-pay | Admitting: Cardiology

## 2020-11-10 NOTE — Telephone Encounter (Signed)
Noted, thank you

## 2020-11-10 NOTE — Telephone Encounter (Signed)
Prescription refill request for Eliquis received. Indication: atrial fibrillation Last office visit:11/21 krasowski Scr: needs labs Age: 55 Weight: 123.4 kg  Prescription refilled

## 2020-12-07 ENCOUNTER — Telehealth: Payer: Self-pay | Admitting: Neurology

## 2020-12-07 NOTE — Telephone Encounter (Signed)
Pt asking to be sedated for his brain scan, please call

## 2020-12-07 NOTE — Telephone Encounter (Signed)
Called patient who stated he tried Valium for MRI, tried getting MRI twice. He's unable to lay down for MRI due to his head pounding. He stated he's had MRIs in hospital under sedation. I advised will send his request to Dr Lucia Gaskins. Patient verbalized understanding, appreciation.

## 2020-12-08 NOTE — Addendum Note (Signed)
Addended by: Naomie Dean B on: 12/08/2020 03:49 PM   Modules accepted: Orders

## 2020-12-08 NOTE — Telephone Encounter (Signed)
I ordered it with sedation at [facility] thanks

## 2020-12-09 NOTE — Telephone Encounter (Signed)
I called the pt but VM answered. Cannot leave details per DPR. I did not leave a message but instead will try to call back tomorrow AM.

## 2020-12-09 NOTE — Telephone Encounter (Signed)
Bethany, I am not sure we can get his MRI approved, with his insurance he may need to go to a higher level of care institution to get it approved and completed. How would he feel if we referred him to the [facility] or [facility] or [facility] headache clinics? They are each leaders in the wrold of headache, wouldn;t be a bad idea to get their opinion anyway given his persistent daily headaches. Please ask him and let me know.

## 2020-12-09 NOTE — Telephone Encounter (Signed)
Pending with medicaid healthy blue. I uploaded documentation on the portal.

## 2020-12-10 NOTE — Telephone Encounter (Signed)
Medicaid healthy blue ended up approving his MRI Brain.. I faxed information to [facility] for the sedated MRI. They will reach out to the patient to schedule.  MCD Healthy blue Berkley Harvey: [id number] (exp. 12/09/20 to 02/07/21).

## 2020-12-16 ENCOUNTER — Telehealth (HOSPITAL_COMMUNITY): Payer: Self-pay

## 2020-12-21 NOTE — Telephone Encounter (Signed)
[facility] is scheduled at [facility] for 01/07/21 to have a sedated MRI. I put H&P form on nurse Bethany's desk for it to be filled out.

## 2020-12-22 ENCOUNTER — Telehealth: Payer: Self-pay | Admitting: *Deleted

## 2020-12-22 NOTE — Telephone Encounter (Signed)
LVM on pt's home and cell #s asking for a call back to schedule an appointment. If he calls back please set him up with one of the NPs (there are openings this week).

## 2020-12-22 NOTE — Telephone Encounter (Signed)
Patient is going to have a sedated MRI brain at [facility].  We received a fax from the radiology department containing a mandatory H&P and airway assessment form.  The patient must be seen and evaluated by Dr Lucia Gaskins or NP less than 30 days from his MRI appointment which is scheduled for January 07, 2021.  They also will need a copy of the most recent office notes on the patient.  The form states if they cannot obtain the information within 48 hours prior to the scheduled appointment the patient will be rescheduled.

## 2020-12-30 NOTE — Telephone Encounter (Addendum)
I called the patient's wife, Lupita Leash (on [place]) and left voicemail asking for patient to return our call as soon as possible.  I expressed the importance of this call.  I called the patient and LVM asking for him to call us back ASAP.  I let him know it was very important as this is regarding an upcoming test next week.  Left office number in messages.  When he calls back, please get him scheduled for an appointment soonest available with either Molly Maduro, or Dr. Lucia Gaskins.  He needs to be seen next week at least 48 hours before the 28th for a brief physical for the sedated MRI.  I currently have an appointment held with [facility] for Monday 4/25 at 9:30. Hopefully this works for him.  Let him know that he has to be seen or his MRI cannot be done.

## 2020-12-30 NOTE — Telephone Encounter (Signed)
Called the pt's wife again and LVM asking for either her or the patient to call us back asap. The pt needs to be scheduled for an appt that is needed before he can have his test next week. Left office number in message.

## 2020-12-30 NOTE — Telephone Encounter (Signed)
Pt's wife returned call. She will be r/s MRI due to pt being ill but she is still needing to speak to RN. She will call back tomorrow to speak to RN due to her being at work and calling on her break.

## 2021-01-04 ENCOUNTER — Ambulatory Visit (HOSPITAL_COMMUNITY): Payer: Medicaid Other

## 2021-01-04 NOTE — Telephone Encounter (Signed)
Patient wife Lupita Leash called stating that [facility] Thompson can not have the sedated MRI on 01/07/21 because he is on antibiotics form an infection in his lungs. I informed her that they would have to call [facility] to reschedule that appointment since it is a sedated MRI.

## 2021-01-05 NOTE — Telephone Encounter (Signed)
Spoke with patient and discussed per Dr. Lucia Gaskins that we are going to refer him to an academic center/higher level of care as discussed previously. We are not able to obtain another MRI authorization for him and this one is expiring soon.  The patient stated he had to cancel his MRI because he had a lung infection.  He is currently recovering.  He believes it was caused by CPAP.  He uses it every night.  He stated he saw primary care last week and again this morning.  He plans on rescheduling the MRI before the authorization expires on May 29.  The patient is aware that he needs to be seen for a short physical so we can fill out a form that is required by the radiology department.  I asked him to give [place] a call as soon as he gets his MRI scheduled.  He can see a nurse practitioner for this, whoever is available soonest.  Also, I kindly let the patient know that we have tried to call him several times and did not hear back from him.  He said he does not check his messages.  I asked him to try if at all possible to answer/return our calls.  The patient verbalized understanding and appreciation for the call.

## 2021-01-06 ENCOUNTER — Other Ambulatory Visit: Payer: Self-pay | Admitting: Neurology

## 2021-01-06 DIAGNOSIS — G8929 Other chronic pain: Secondary | ICD-10-CM

## 2021-01-06 DIAGNOSIS — R519 Headache, unspecified: Secondary | ICD-10-CM

## 2021-01-06 NOTE — Telephone Encounter (Signed)
Noted thank you

## 2021-01-07 ENCOUNTER — Ambulatory Visit (HOSPITAL_COMMUNITY): Admission: RE | Admit: 2021-01-07 | Payer: Medicaid Other | Source: Ambulatory Visit

## 2021-01-07 ENCOUNTER — Ambulatory Visit (HOSPITAL_COMMUNITY): Admission: RE | Admit: 2021-01-07 | Payer: Medicaid Other | Source: Home / Self Care

## 2021-01-07 ENCOUNTER — Encounter (HOSPITAL_COMMUNITY): Admission: RE | Payer: Self-pay | Source: Home / Self Care

## 2021-01-07 ENCOUNTER — Encounter (HOSPITAL_COMMUNITY): Payer: Self-pay

## 2021-01-07 SURGERY — MRI WITH ANESTHESIA
Anesthesia: General

## 2021-01-12 NOTE — Telephone Encounter (Signed)
I called [facility] [phone]) and spoke with Toniann Fail to confirm correct fax number and protocol to send referral. Toniann Fail stated that I would need to fax notes from previous 3 visits or notes from the last 6 months (in this case notes from only visit on 1/25). Toniann Fail states that the wait is about 3-4 months. After our call, I filled out their fax form to send the referral and faxed to [phone]).

## 2021-01-21 ENCOUNTER — Other Ambulatory Visit: Payer: Self-pay

## 2021-01-21 DIAGNOSIS — S060X9A Concussion with loss of consciousness of unspecified duration, initial encounter: Secondary | ICD-10-CM | POA: Insufficient documentation

## 2021-01-21 DIAGNOSIS — F1910 Other psychoactive substance abuse, uncomplicated: Secondary | ICD-10-CM

## 2021-01-21 DIAGNOSIS — Z658 Other specified problems related to psychosocial circumstances: Secondary | ICD-10-CM

## 2021-01-21 DIAGNOSIS — Z7189 Other specified counseling: Secondary | ICD-10-CM

## 2021-01-21 DIAGNOSIS — M1990 Unspecified osteoarthritis, unspecified site: Secondary | ICD-10-CM | POA: Insufficient documentation

## 2021-01-21 DIAGNOSIS — Z9889 Other specified postprocedural states: Secondary | ICD-10-CM | POA: Insufficient documentation

## 2021-01-21 DIAGNOSIS — S060XAA Concussion with loss of consciousness status unknown, initial encounter: Secondary | ICD-10-CM | POA: Insufficient documentation

## 2021-01-21 DIAGNOSIS — E785 Hyperlipidemia, unspecified: Secondary | ICD-10-CM

## 2021-01-21 DIAGNOSIS — K2900 Acute gastritis without bleeding: Secondary | ICD-10-CM | POA: Insufficient documentation

## 2021-01-21 DIAGNOSIS — R945 Abnormal results of liver function studies: Secondary | ICD-10-CM

## 2021-01-21 DIAGNOSIS — E669 Obesity, unspecified: Secondary | ICD-10-CM

## 2021-01-21 DIAGNOSIS — M545 Low back pain, unspecified: Secondary | ICD-10-CM | POA: Insufficient documentation

## 2021-01-21 HISTORY — DX: Other psychoactive substance abuse, uncomplicated: F19.10

## 2021-01-21 HISTORY — DX: Other specified problems related to psychosocial circumstances: Z65.8

## 2021-01-21 HISTORY — DX: Obesity, unspecified: E66.9

## 2021-01-21 HISTORY — DX: Hyperlipidemia, unspecified: E78.5

## 2021-01-21 HISTORY — DX: Abnormal results of liver function studies: R94.5

## 2021-01-21 HISTORY — DX: Other specified counseling: Z71.89

## 2021-01-21 HISTORY — DX: Other specified postprocedural states: Z98.890

## 2021-01-21 HISTORY — DX: Low back pain, unspecified: M54.50

## 2021-01-26 ENCOUNTER — Ambulatory Visit: Payer: Medicaid Other | Admitting: Cardiology

## 2021-01-29 ENCOUNTER — Ambulatory Visit (INDEPENDENT_AMBULATORY_CARE_PROVIDER_SITE_OTHER): Payer: Medicaid Other | Admitting: Cardiology

## 2021-01-29 ENCOUNTER — Encounter: Payer: Self-pay | Admitting: Cardiology

## 2021-01-29 ENCOUNTER — Other Ambulatory Visit: Payer: Self-pay

## 2021-01-29 VITALS — BP 120/62 | HR 79 | Ht 69.0 in | Wt 268.0 lb

## 2021-01-29 DIAGNOSIS — IMO0001 Reserved for inherently not codable concepts without codable children: Secondary | ICD-10-CM

## 2021-01-29 DIAGNOSIS — I4821 Permanent atrial fibrillation: Secondary | ICD-10-CM

## 2021-01-29 DIAGNOSIS — I10 Essential (primary) hypertension: Secondary | ICD-10-CM | POA: Diagnosis not present

## 2021-01-29 DIAGNOSIS — F17200 Nicotine dependence, unspecified, uncomplicated: Secondary | ICD-10-CM

## 2021-01-29 DIAGNOSIS — E785 Hyperlipidemia, unspecified: Secondary | ICD-10-CM

## 2021-01-29 NOTE — Progress Notes (Signed)
Cardiology Office Note:    Date:  01/29/2021   ID:  HJALMER IOVINO, DOB 14-Jul-1966, MRN [id number]  PCP:  Quitman Livings, MD  Cardiologist:  Gypsy Balsam, MD    Referring MD: Quitman Livings, MD   Chief Complaint  Patient presents with  . Follow-up  I am doing fine cardiac wise  History of Present Illness:    Peter Thompson is a 55 y.o. male with past medical history significant for permanent atrial fibrillation, he did have cardioversion done but very quickly returned to atrial fibrillation.  Now he does not want to have any cardioversion, he also have history of obstructive sleep apnea, multiple pains, essential hypertension, anxiety. Comes today 2 months of follow-up he said couple nights ago he woke up in the middle of the night with his heart beating fast he check his heart rate was 110.  He got scared also get some shortness of breath.  But eventually everything subsided.  Since that time he is doing fine.  Like always he complained to have pain in his legs this is a chronic complaint.  Past Medical History:  Diagnosis Date  . Abnormal results of liver function studies 01/21/2021  . Anemia 10/17/2018  . Anxiety   . Arthritis   . Bursitis   . Cervicogenic headache 10/06/2020  . Complication of anesthesia    "woke up in OR- IV came out."  . Concussion    several- head injury- was in [facility]  . DJD (degenerative joint disease)   . Essential hypertension 10/17/2018  . History of arthroscopic procedure on shoulder 01/21/2021  . Hypertension   . Insomnia   . Left-sided epistaxis 01/30/2019  . Low back pain 01/21/2021  . Neck fracture (HCC)   . Obesity 01/21/2021  . Obstructive sleep apnea 09/04/2019  . Osteoarthrosis, unspecified whether generalized or localized, other specified sites   . Other and unspecified hyperlipidemia 01/21/2021  . Other specified counseling 01/21/2021  . Other, mixed, or unspecified nondependent drug abuse, unspecified 01/21/2021  . Paroxysmal atrial  fibrillation (HCC) 12/26/2018  . Persistent atrial fibrillation (HCC)   . Pre-op evaluation 12/05/2016  . Primary localized osteoarthritis of left hip 05/10/2016  . Primary osteoarthritis of left hip 05/10/2016  . Primary osteoarthritis of right knee 10/02/2018  . Psychosocial circumstance 01/21/2021  . Smoking 12/05/2016  . Tinnitus, bilateral     Past Surgical History:  Procedure Laterality Date  . ANKLE SURGERY Left   . Arm surgery Right    fracture repair  . CERVICAL SPINE SURGERY     bone graft from left hip  . HAND SURGERY Left    drains for Infection  . HIP SURGERY Left    "scrapped" x2  . KNEE ARTHROSCOPY Right   . LEG SURGERY Left    "1 inch took out"  . SHOULDER ARTHROSCOPY Left 12/27/2016   Procedure: ARTHROSCOPY SHOULDER;  Surgeon: Marcene Corning, MD;  Location: [facility] OR;  Service: Orthopedics;  Laterality: Left;  Debridement, AC Decompression, Acromioplasty   . TOTAL HIP ARTHROPLASTY Left 05/10/2016   Procedure: TOTAL HIP ARTHROPLASTY ANTERIOR APPROACH;  Surgeon: Marcene Corning, MD;  Location: [facility] OR;  Service: Orthopedics;  Laterality: Left;  . TOTAL KNEE ARTHROPLASTY Right 10/02/2018   Procedure: TOTAL KNEE ARTHROPLASTY;  Surgeon: Marcene Corning, MD;  Location: [facility] OR;  Service: Orthopedics;  Laterality: Right;  . WRIST SURGERY Right    2 Pinns    Current Medications: Current Meds  Medication Sig  . amLODipine (NORVASC) 10 MG tablet  Take 10 mg by mouth daily.  . cloNIDine (CATAPRES) 0.2 MG tablet Take 0.2 mg by mouth 3 (three) times daily.  Marland Kitchen ELIQUIS 5 MG TABS tablet TAKE 1 TABLET BY MOUTH TWICE DAILY  . furosemide (LASIX) 20 MG tablet Take 80 mg by mouth daily. Take for 1 week per Dr. Kirtland Bouchard  . hydrALAZINE (APRESOLINE) 100 MG tablet Take 100 mg by mouth 3 (three) times daily.  Marland Kitchen labetalol (NORMODYNE) 300 MG tablet Take 1 tablet (300 mg total) by mouth 2 (two) times daily.  Marland Kitchen lisinopril-hydrochlorothiazide (PRINZIDE,ZESTORETIC) 20-25 MG tablet Take 1 tablet by mouth daily.  .  Multiple Vitamins-Minerals (MULTIVITAMIN WITH MINERALS) tablet Take 1 tablet by mouth daily.  Marland Kitchen oxyCODONE 20 MG TABS Take 1 tablet (20 mg total) by mouth every 4 (four) hours as needed (severe pain).  . potassium chloride (K-DUR) 10 MEQ tablet Take 10 mEq by mouth daily.  Marland Kitchen PROAIR HFA 108 (90 Base) MCG/ACT inhaler Inhale 2 puffs into the lungs 4 (four) times daily as needed.     Allergies:   Patient has no known allergies.   Social History   Socioeconomic History  . Marital status: Legally Separated    Spouse name: Not on file  . Number of children: 0  . Years of education: Not on file  . Highest education level: Some college, no degree  Occupational History  . Not on file  Tobacco Use  . Smoking status: Former Smoker    Packs/day: 0.50    Years: 56.00    Pack years: 28.00    Types: Cigarettes    Quit date: 12/25/2020    Years since quitting: 0.0  . Smokeless tobacco: Former Neurosurgeon    Types: Chew  . Tobacco comment: smokes less than 0.5 ppd now  Vaping Use  . Vaping Use: Never used  Substance and Sexual Activity  . Alcohol use: Yes    Alcohol/week: 12.0 standard drinks    Types: 12 Cans of beer per week  . Drug use: No  . Sexual activity: Not on file  Other Topics Concern  . Not on file  Social History Narrative   Lives alone   "ambidextrous, write with my right hand"   Caffeine: 2 cups coffee/day   Social Determinants of Health   Financial Resource Strain: Not on file  Food Insecurity: Not on file  Transportation Needs: Not on file  Physical Activity: Not on file  Stress: Not on file  Social Connections: Not on file     Family History: The patient's family history includes Cancer in an other family member. There is no history of Heart disease, Headache, or Migraines. ROS:   Please see the history of present illness.    All 14 point review of systems negative except as described per history of present illness  EKGs/Labs/Other Studies Reviewed:      Recent  Labs: 10/06/2020: BUN CANCELED; Creatinine, Ser CANCELED; Potassium CANCELED; Sodium CANCELED  Recent Lipid Panel No results found for: CHOL, TRIG, HDL, CHOLHDL, VLDL, LDLCALC, LDLDIRECT  Physical Exam:    VS:  BP 120/62 (BP Location: Left Arm, Patient Position: Sitting, Cuff Size: Normal)   Pulse 79   Ht 5\' 9"  (1.753 m)   Wt 268 lb (121.6 kg)   SpO2 96%   BMI 39.58 kg/m     Wt Readings from Last 3 Encounters:  01/29/21 268 lb (121.6 kg)  10/06/20 272 lb (123.4 kg)  07/16/20 264 lb 9.6 oz (120 kg)  GEN:  Well nourished, well developed in no acute distress HEENT: Normal NECK: No JVD; No carotid bruits LYMPHATICS: No lymphadenopathy CARDIAC: Irregularly irregular, no murmurs, no rubs, no gallops RESPIRATORY:  Clear to auscultation without rales, wheezing or rhonchi  ABDOMEN: Soft, non-tender, non-distended MUSCULOSKELETAL:  No edema; No deformity  SKIN: Warm and dry LOWER EXTREMITIES: no swelling NEUROLOGIC:  Alert and oriented x 3 PSYCHIATRIC:  Normal affect   ASSESSMENT:    1. Hyperlipidemia, unspecified hyperlipidemia type   2. Permanent atrial fibrillation (HCC)   3. Essential (primary) hypertension   4. Smoking    PLAN:    In order of problems listed above:  1. Permanent atrial fibrillation rate controlled anticoagulated which I will continue. 2. Essential hypertension blood pressure seems to be well controlled continue present management. 3. Smoking obviously probably had a discussion about the lab strongly recommend to quit. 4. Dyslipidemia we will check his fasting lipid profile today.   Medication Adjustments/Labs and Tests Ordered: Current medicines are reviewed at length with the patient today.  Concerns regarding medicines are outlined above.  Orders Placed This Encounter  Procedures  . Lipid panel  . EKG 12-Lead   Medication changes: No orders of the defined types were placed in this encounter.   Signed, Georgeanna Lea, MD,  [facility] 01/29/2021 12:00 PM    [facility]

## 2021-01-29 NOTE — Patient Instructions (Addendum)
Medication Instructions:  Your physician recommends that you continue on your current medications as directed. Please refer to the Current Medication list given to you today.  *If you need a refill on your cardiac medications before your next appointment, please call your pharmacy*   Lab Work: Your physician recommends that you return for lab work in: TODAY Lipids If you have labs (blood work) drawn today and your tests are completely normal, you will receive your results only by: . MyChart Message (if you have MyChart) OR . A paper copy in the mail If you have any lab test that is abnormal or we need to change your treatment, we will call you to review the results.   Testing/Procedures: None   Follow-Up: At [facility], you and your health needs are our priority.  As part of our continuing mission to provide you with exceptional heart care, we have created designated Provider Care Teams.  These Care Teams include your primary Cardiologist (physician) and Advanced Practice Providers (APPs -  Physician Assistants and Nurse Practitioners) who all work together to provide you with the care you need, when you need it.  We recommend signing up for the patient portal called "MyChart".  Sign up information is provided on this After Visit Summary.  MyChart is used to connect with patients for Virtual Visits (Telemedicine).  Patients are able to view lab/test results, encounter notes, upcoming appointments, etc.  Non-urgent messages can be sent to your provider as well.   To learn more about what you can do with MyChart, go to [email or web address].    Your next appointment:   6 month(s)  The format for your next appointment:   In Person  Provider:   Robert Krasowski, MD   Other Instructions   

## 2021-01-30 LAB — LIPID PANEL
Chol/HDL Ratio: 3.6 ratio (ref 0.0–5.0)
Cholesterol, Total: 130 mg/dL (ref 100–199)
HDL: 36 mg/dL — ABNORMAL LOW (ref >39)
LDL Chol Calc (NIH): 72 mg/dL (ref 0–99)
Triglycerides: 120 mg/dL (ref 0–149)
VLDL Cholesterol Cal: 22 mg/dL (ref 5–40)

## 2021-02-04 ENCOUNTER — Telehealth: Payer: Self-pay | Admitting: Cardiology

## 2021-02-04 NOTE — Telephone Encounter (Signed)
Patient was returning call for results 

## 2021-02-04 NOTE — Telephone Encounter (Signed)
Left message for patient to return call.

## 2021-02-05 ENCOUNTER — Ambulatory Visit: Payer: Medicaid Other | Admitting: Cardiology

## 2021-02-09 ENCOUNTER — Telehealth: Payer: Self-pay

## 2021-02-09 NOTE — Telephone Encounter (Signed)
Patient notified of test results 

## 2021-02-09 NOTE — Telephone Encounter (Signed)
-----   Message from Georgeanna Lea, MD sent at 02/02/2021 10:31 AM EDT ----- Cholesterol looks good, continue present management

## 2021-02-11 ENCOUNTER — Other Ambulatory Visit: Payer: Self-pay | Admitting: Cardiology

## 2021-02-11 NOTE — Telephone Encounter (Signed)
27m, 121.6kg, scr 0.58 10/06/20, lovw/krasowski 01/29/21

## 2021-02-16 NOTE — Telephone Encounter (Signed)
Follow Up:      Pt's wife is calling back about his lab results. Please call pt with his results.

## 2021-02-17 NOTE — Telephone Encounter (Signed)
Left message for patient to return call.

## 2021-02-19 NOTE — Telephone Encounter (Signed)
Tried to call no answer no voicemail

## 2021-02-23 NOTE — Telephone Encounter (Signed)
Patient was notified on 02/09/21

## 2021-03-04 DIAGNOSIS — K429 Umbilical hernia without obstruction or gangrene: Secondary | ICD-10-CM | POA: Insufficient documentation

## 2021-03-17 ENCOUNTER — Ambulatory Visit: Payer: Medicaid Other | Admitting: Cardiology

## 2021-05-26 ENCOUNTER — Telehealth: Payer: Self-pay

## 2021-05-26 NOTE — Telephone Encounter (Signed)
   [facility] Pre-operative Risk Assessment    Patient Name: Peter Thompson  DOB: 03/25/1966 MRN: [id number]  HEARTCARE STAFF:  - IMPORTANT!!!!!! Under Visit Info/Reason for Call, type in Other and utilize the format Clearance MM/DD/YY or Clearance TBD. Do not use dashes or single digits. - Please review there is not already an duplicate clearance open for this procedure. - If request is for dental extraction, please clarify the # of teeth to be extracted. - If the patient is currently at the dentist's office, call Pre-Op Callback Staff (MA/nurse) to input urgent request.  - If the patient is not currently in the dentist office, please route to the Pre-Op pool.  Request for surgical clearance:  What type of surgery is being performed? Open repair of umbilical hernia with mesh  When is this surgery scheduled? 06/14/21  What type of clearance is required (medical clearance vs. Pharmacy clearance to hold med vs. Both)? Both  Are there any medications that need to be held prior to surgery and how long? Eliquis  Practice name and name of physician performing surgery? [facility]- Dr. Orrin Brigham  What is the office phone number? [phone]   7.   What is the office fax number? [phone]  8.   Anesthesia type (None, local, MAC, general) ? General   Lowella Grip 05/26/2021, 1:46 PM  _________________________________________________________________   (provider comments below)

## 2021-05-27 NOTE — Telephone Encounter (Signed)
Patient with diagnosis of afib on Eliquis for anticoagulation.    Procedure: open repair of umbilical hernia with mesh Date of procedure: 06/14/21  CHA2DS2-VASc Score = 1  This indicates a 0.6% annual risk of stroke. The patient's score is based upon: CHF History: 0 HTN History: 1 Diabetes History: 0 Stroke History: 0 Vascular Disease History: 0 Age Score: 0 Gender Score: 0   Hasn't had labs checked in 2.5 years. Needs BMET and CBC before clearance can be provided.  If labs are stable and renal function is normal, he can hold Eliquis for 2-3 days prior to procedure.

## 2021-05-28 ENCOUNTER — Other Ambulatory Visit: Payer: Self-pay | Admitting: *Deleted

## 2021-05-28 ENCOUNTER — Other Ambulatory Visit: Payer: Self-pay | Admitting: Physician Assistant

## 2021-05-28 DIAGNOSIS — Z79899 Other long term (current) drug therapy: Secondary | ICD-10-CM

## 2021-05-28 DIAGNOSIS — I4821 Permanent atrial fibrillation: Secondary | ICD-10-CM

## 2021-05-28 DIAGNOSIS — I10 Essential (primary) hypertension: Secondary | ICD-10-CM

## 2021-05-28 NOTE — Telephone Encounter (Addendum)
   Name: Peter Thompson  DOB: 1966/03/07  MRN: [id number]   Primary Cardiologist: Gypsy Balsam, MD  Chart reviewed as part of pre-operative protocol coverage for open repair of umbilical hernia with mesh on 06/14/2021.  He has history of permanent atrial fibrillation without desire for cardioversion, OSA, hypertension, and anxiety.  He last seen on 01/29/2021 by Dr. Bing Matter.    Patient contacted 05/28/2021 to reevaluate symptoms.  Since that time, he has not had any chest pain or shortness of breath.  He reports his activity is significantly limited by his pain of his lower extremities and multiple orthopedic surgeries, estimated at 1-4 METS.  He is able to walk without any chest pain or shortness of breath.  Based on ACC/AHA guidelines, the patient would be at acceptable risk for the planned procedure without further cardiovascular testing or an office visit.  He was advised that if he develops new symptoms prior to surgery to contact our office to arrange for a follow-up visit, and he verbalized understanding.  Pharmacy has reviewed his chart with recommendation for BMET and CBC before clearance can be provided.    If renal function normal and labs stable, he can hold Eliquis for 2 to 3 days prior to the procedure.   Lennon Alstrom, PA-C 05/28/2021, 1:08 PM

## 2021-05-28 NOTE — Telephone Encounter (Signed)
Called pt.  I couldn't hardly understand him.  He stated he was driving and will call when he gets to his destination.

## 2021-05-31 ENCOUNTER — Telehealth: Payer: Self-pay | Admitting: *Deleted

## 2021-05-31 NOTE — Telephone Encounter (Signed)
Message Received: 3 days ago Peter Thompson, Peter Thompson, Peter Thompson  P Cv [facility] Preop Callback I had already placed the lab orders - feel free to correct if placed incorrectly. Thank you!   Telephone 05/26/2021 [facility] at [facility], [place], [place] Clearance 06/14/21 Reason for call        Conversation: Clearance 06/14/21 (Newest Message First) May 28, 2021 Peter Thompson, [place]     2:11 PM Note Called pt.  I couldn't hardly understand him.  He stated he was driving and will call when he gets to his destination.           2:08 PM Peter Thompson, Peter Thompson attempted to contact Peter Thompson (Not Available)   Peter Thompson, Peter Thompson to C[facility] Preop Callback     2:01 PM I had already placed the lab orders - feel free to correct if placed incorrectly. Thank you!  Peter Thompson, Peter Thompson to [facility] Preop     2:00 PM I had already placed the lab orders - feel free to correct if placed incorrectly. Thank you!  Peter Thompson, Peter Thompson to [facility] Pre[facility]     1:35 PM Does pt need just labs or Peter Thompson Visit?       1:11 PM Peter Thompson, Peter Thompson routed this conversation to Cv Div Preop Callback  Peter Thompson, [place]     1:11 PM Note   Name: Peter Thompson  DOB: 05/20/1966  MRN: [id number]    Primary Cardiologist: Peter Thompson, Peter Thompson   Chart reviewed as part of pre-operative protocol coverage for open repair of umbilical hernia with mesh on 06/14/2021.  He has history of permanent atrial fibrillation without desire for cardioversion, OSA, hypertension, and anxiety.  He last seen on 01/29/2021 by Dr. Agustin Thompson.     Patient contacted 05/28/2021 to reevaluate symptoms.  Since that time, he has not had any chest pain or shortness of breath.  He reports his activity is significantly limited by his pain of his lower extremities and multiple orthopedic surgeries, estimated at 1-4 METS.  He is able to walk without any chest pain or shortness of breath.  Based on ACC/AHA guidelines,  the patient would be at acceptable risk for the planned procedure without further cardiovascular testing or an office visit.  He was advised that if he develops new symptoms prior to surgery to contact our office to arrange for a follow-up visit, and he verbalized understanding.   Pharmacy has reviewed his chart with recommendation for BMET and CBC before clearance can be provided.     If renal function normal and labs stable, he can hold Eliquis for 2 to 3 days prior to the procedure.     Peter Thompson, Peter Thompson 05/28/2021, 1:08 PM      May 27, 2021      11:19 AM Peter Thompson, Peter Thompson, Peter Thompson routed this conversation to Cv Div Preop  Peter Thompson, Peter Thompson, Peter Thompson     9:45 AM Note Patient with diagnosis of afib on Eliquis for anticoagulation.     Procedure: open repair of umbilical hernia with mesh Date of procedure: 06/14/21   CHA2DS2-VASc Score = 1  This indicates a 0.6% annual risk of stroke. The patient's score is based upon: CHF History: 0 HTN History: 1 Diabetes History: 0 Stroke History: 0 Vascular Disease History: 0 Age Score: 0 Gender Score: 0    Hasn't had labs checked in 2.5 years. Needs BMET and CBC before clearance can be provided.  If labs are stable and renal function is normal, he can hold Eliquis for 2-3 days prior to procedure.     May 26, 2021 Ledora Bottcher, [place] to [facility] Pre[facility]     5:04 PM Can you please give recs for eliquis?      1:47 PM Lowella Grip, CMA routed this conversation to C[facility] Preop  Lowella Grip, CMA     1:47 PM Note     [facility] Pre-operative Risk Assessment    Patient Name: Peter Thompson  DOB: 1966-06-23 MRN: [id number]   HEARTCARE STAFF:   - IMPORTANT!!!!!! Under Visit Info/Reason for Call, type in Other and utilize the format Clearance MM/DD/YY or Clearance TBD. Do not use dashes or single digits. - Please review there is not already an duplicate clearance open  for this procedure. - If request is for dental extraction, please clarify the # of teeth to be extracted. - If the patient is currently at the dentist's office, call Pre-Op Callback Staff (MA/nurse) to input urgent request.  - If the patient is not currently in the dentist office, please route to the Pre-Op pool.   Request for surgical clearance:   What type of surgery is being performed? Open repair of umbilical hernia with mesh   When is this surgery scheduled? 06/14/21   What type of clearance is required (medical clearance vs. Pharmacy clearance to hold med vs. Both)? Both   Are there any medications that need to be held prior to surgery and how long? Eliquis   Practice name and name of physician performing surgery? [facility]- Dr. Orrin Brigham   What is the office phone number? [phone]   7.   What is the office fax number? [phone]   8.   Anesthesia type (None, local, MAC, general) ? General     Lowella Grip 05/26/2021, 1:46 PM  _________________________________________________________________   (provider comments below)

## 2021-05-31 NOTE — Telephone Encounter (Signed)
Left voicemail on patients and patient's ex wife Lupita Leash, per DPR to call our office as we need the patient to have blood work done for his surgical clearance.

## 2021-05-31 NOTE — Telephone Encounter (Signed)
See previous notes where we have s/w the pt in regards to needing lab work for pre op clearance. Looks like the pt is followed by Dr. Bing Matter in [place] office. Left message for the pt to call [place] office to set up lab appt or if he can just walk in for his lab work.

## 2021-05-31 NOTE — Telephone Encounter (Signed)
-----   Message from Jacquelyn D Visser, PA-C sent at 05/28/2021  2:01 PM EDT ----- I had already placed the lab orders - feel free to correct if placed incorrectly. Thank you!  

## 2021-05-31 NOTE — Telephone Encounter (Signed)
Patient called and explained to him he needed blood work done before his surgical clearance. He will come into the office this week and have it done.

## 2021-05-31 NOTE — Telephone Encounter (Signed)
-----   Message from Lennon Alstrom, PA-C sent at 05/28/2021  2:01 PM EDT ----- I had already placed the lab orders - feel free to correct if placed incorrectly. Thank you!

## 2021-06-01 NOTE — Telephone Encounter (Signed)
Coming on board preop team today. Marisue Ivan already cleared patient below, just needs BMET/CBC as outlined. I see in a separate phone note patient is aware. Will keep in preop box until they result. Jacquelyn - please let preop team know when labs result to your inbox and we will route a message to pharm team here. Thx.

## 2021-06-02 NOTE — Telephone Encounter (Signed)
   Peter Thompson with [facility] calling, she would like to get an update for pt's preop clearance

## 2021-06-02 NOTE — Telephone Encounter (Signed)
Will route to callback team to notify surgery team that we are waiting on patient to come get his labs drawn before we can provide final recommendations. Please remind pt as well.

## 2021-06-02 NOTE — Telephone Encounter (Signed)
Returned the call to [facility] to make her aware that we was waiting on pt to come and get some lab work done before we can send clearance.

## 2021-06-02 NOTE — Telephone Encounter (Signed)
Placed call to pt.  He plans to go either Tomorrow or Friday of this week for his lab work.  Will route back to [facility] to make her aware that we are waiting on that in order to give pt clearance.

## 2021-06-04 ENCOUNTER — Other Ambulatory Visit: Payer: Self-pay

## 2021-06-04 DIAGNOSIS — I4821 Permanent atrial fibrillation: Secondary | ICD-10-CM

## 2021-06-04 DIAGNOSIS — I10 Essential (primary) hypertension: Secondary | ICD-10-CM

## 2021-06-05 LAB — CBC
Hematocrit: 32.2 % — ABNORMAL LOW (ref 37.5–51.0)
Hemoglobin: 11 g/dL — ABNORMAL LOW (ref 13.0–17.7)
MCH: 30.4 pg (ref 26.6–33.0)
MCHC: 34.2 g/dL (ref 31.5–35.7)
MCV: 89 fL (ref 79–97)
Platelets: 204 10*3/uL (ref 150–450)
RBC: 3.62 x10E6/uL — ABNORMAL LOW (ref 4.14–5.80)
RDW: 12 % (ref 11.6–15.4)
WBC: 5 10*3/uL (ref 3.4–10.8)

## 2021-06-05 LAB — BASIC METABOLIC PANEL
BUN/Creatinine Ratio: 19 (ref 9–20)
BUN: 18 mg/dL (ref 6–24)
CO2: 24 mmol/L (ref 20–29)
Calcium: 9.7 mg/dL (ref 8.7–10.2)
Chloride: 95 mmol/L — ABNORMAL LOW (ref 96–106)
Creatinine, Ser: 0.96 mg/dL (ref 0.76–1.27)
Glucose: 113 mg/dL — ABNORMAL HIGH (ref 65–99)
Potassium: 4 mmol/L (ref 3.5–5.2)
Sodium: 135 mmol/L (ref 134–144)
eGFR: 93 mL/min/{1.73_m2} (ref >59)

## 2021-06-07 NOTE — Telephone Encounter (Signed)
   Patient Name: Peter Thompson  DOB: 1966/01/31 MRN: [id number]  Primary Cardiologist: Gypsy Balsam, MD  Chart reviewed as part of pre-operative protocol coverage. Pre-op clearance addressed by colleagues in earlier phone notes. To summarize recommendations:  - Per Marisue Ivan, PA-C, " Based on ACC/AHA guidelines, the patient would be at acceptable risk for the planned procedure without further cardiovascular testing or an office visit.  He was advised that if he develops new symptoms prior to surgery to contact our office to arrange for a follow-up visit, and he verbalized understanding."  - Per pharmD, "Pt ok to hold Eliquis for 2-3 days prior to procedure."  Will route this bundled recommendation to requesting provider via Epic fax function and remove from pre-op pool. Please call with questions.  Laurann Montana, PA-C 06/07/2021, 11:32 AM

## 2021-06-07 NOTE — Telephone Encounter (Signed)
Plt 204k, CrCl > 157mL/min.  Pt ok to hold Eliquis for 2-3 days prior to procedure.

## 2021-06-07 NOTE — Telephone Encounter (Signed)
Pt's lab results are in. Will forward this note back to pre op to review lab results that were needed for pre op clearance.

## 2021-06-07 NOTE — Telephone Encounter (Signed)
This is a duplicate message. Please disregard. Have already routed to pharm in original clearance request. Will close this out.

## 2021-06-07 NOTE — Progress Notes (Unsigned)
Will forward to pre op pool as these labs were needed for pre op clearance.

## 2021-06-07 NOTE — Telephone Encounter (Signed)
Will route back to pharm now that labs back. Leafy Kindle PA-C has cleared patient on 9/16 so will plan to bundle finalized recs.

## 2021-06-08 ENCOUNTER — Telehealth: Payer: Self-pay

## 2021-06-08 NOTE — Telephone Encounter (Signed)
Able to reach pt regarding his recent lab work, [employer], PA-C from [facility] had a chance to review their results and advised   "Addendum to prior note: please make sure patient knows to f/u PCP for his anemia. He previously had lower values in 2020 but this was in the context of knee surgery. It's better but not all the way normal. We also finalized his clearance to the surgeon for his hernia repair today. Thx!"  Peter Thompson reports "I already new that, they told me". Advised if any questions, pt stated "no". End of phone conversation at that time, pt hung up phone.

## 2021-08-09 ENCOUNTER — Other Ambulatory Visit: Payer: Self-pay | Admitting: Cardiology

## 2021-08-09 NOTE — Telephone Encounter (Signed)
Prescription refill request for Eliquis received. Indication:Afib Last office visit:5/22 Scr:0.9 Age: 55 Weight:121.6 kg  Prescription refilled

## 2021-09-07 DIAGNOSIS — B353 Tinea pedis: Secondary | ICD-10-CM | POA: Insufficient documentation

## 2021-09-07 DIAGNOSIS — S9032XA Contusion of left foot, initial encounter: Secondary | ICD-10-CM | POA: Insufficient documentation

## 2021-09-07 DIAGNOSIS — I872 Venous insufficiency (chronic) (peripheral): Secondary | ICD-10-CM | POA: Insufficient documentation

## 2021-10-21 DIAGNOSIS — H903 Sensorineural hearing loss, bilateral: Secondary | ICD-10-CM | POA: Insufficient documentation

## 2021-10-22 ENCOUNTER — Ambulatory Visit (INDEPENDENT_AMBULATORY_CARE_PROVIDER_SITE_OTHER): Payer: Medicaid Other | Admitting: Cardiology

## 2021-10-22 ENCOUNTER — Other Ambulatory Visit: Payer: Self-pay

## 2021-10-22 ENCOUNTER — Encounter: Payer: Self-pay | Admitting: Cardiology

## 2021-10-22 VITALS — BP 126/76 | HR 92 | Ht 70.0 in | Wt 270.2 lb

## 2021-10-22 DIAGNOSIS — F17200 Nicotine dependence, unspecified, uncomplicated: Secondary | ICD-10-CM

## 2021-10-22 DIAGNOSIS — G4733 Obstructive sleep apnea (adult) (pediatric): Secondary | ICD-10-CM | POA: Diagnosis not present

## 2021-10-22 DIAGNOSIS — I10 Essential (primary) hypertension: Secondary | ICD-10-CM

## 2021-10-22 DIAGNOSIS — I4819 Other persistent atrial fibrillation: Secondary | ICD-10-CM

## 2021-10-22 DIAGNOSIS — I4821 Permanent atrial fibrillation: Secondary | ICD-10-CM | POA: Diagnosis not present

## 2021-10-22 NOTE — Patient Instructions (Signed)

## 2021-10-22 NOTE — Addendum Note (Signed)
Addended by: Edwyna Shell I on: 10/22/2021 03:41 PM   Modules accepted: Orders

## 2021-10-22 NOTE — Progress Notes (Signed)
Cardiology Office Note:    Date:  10/22/2021   ID:  Peter Thompson, DOB 11/10/1965, MRN [id number]  PCP:  Quitman Livings, MD  Cardiologist:  Gypsy Balsam, MD    Referring MD: Quitman Livings, MD   Chief Complaint  Patient presents with   Afib check up    History of Present Illness:    Peter Thompson is a 56 y.o. male with past medical history significant for permanent atrial fibrillation.  Years ago he got 1 cardioversion done however quickly after that return to atrial fibrillation since that time he does not want to do anything about it he is happy just being in atrial fibrillation.  He is anticoagulated which I will continue.  He does have history obstructive sleep apnea, he essential hypertension anxiety multiple pain.  He is an Brewing technologist. Comes today to my office for follow-up.  Overall cardiac wise seems to be doing well described to have fatigue shortness of breath tiredness.  This is a chronic ongoing complaint.  Past Medical History:  Diagnosis Date   Abnormal results of liver function studies 01/21/2021   Anemia 10/17/2018   Anxiety    Arthritis    Bursitis    Cervicogenic headache 10/06/2020   Complication of anesthesia    "woke up in OR- IV came out."   Concussion    several- head injury- was in milatary   DJD (degenerative joint disease)    Essential hypertension 10/17/2018   History of arthroscopic procedure on shoulder 01/21/2021   Hypertension    Insomnia    Left-sided epistaxis 01/30/2019   Low back pain 01/21/2021   Neck fracture (HCC)    Obesity 01/21/2021   Obstructive sleep apnea 09/04/2019   Osteoarthrosis, unspecified whether generalized or localized, other specified sites    Other and unspecified hyperlipidemia 01/21/2021   Other specified counseling 01/21/2021   Other, mixed, or unspecified nondependent drug abuse, unspecified 01/21/2021   Paroxysmal atrial fibrillation (HCC) 12/26/2018   Persistent atrial fibrillation (HCC)    Pre-op evaluation  12/05/2016   Primary localized osteoarthritis of left hip 05/10/2016   Primary osteoarthritis of left hip 05/10/2016   Primary osteoarthritis of right knee 10/02/2018   Psychosocial circumstance 01/21/2021   Smoking 12/05/2016   Tinnitus, bilateral     Past Surgical History:  Procedure Laterality Date   ANKLE SURGERY Left    Arm surgery Right    fracture repair   CERVICAL SPINE SURGERY     bone graft from left hip   HAND SURGERY Left    drains for Infection   HIP SURGERY Left    "scrapped" x2   KNEE ARTHROSCOPY Right    LEG SURGERY Left    "1 inch took out"   SHOULDER ARTHROSCOPY Left 12/27/2016   Procedure: ARTHROSCOPY SHOULDER;  Surgeon: Marcene Corning, MD;  Location: [facility] OR;  Service: Orthopedics;  Laterality: Left;  Debridement, AC Decompression, Acromioplasty    TOTAL HIP ARTHROPLASTY Left 05/10/2016   Procedure: TOTAL HIP ARTHROPLASTY ANTERIOR APPROACH;  Surgeon: Marcene Corning, MD;  Location: [facility] OR;  Service: Orthopedics;  Laterality: Left;   TOTAL KNEE ARTHROPLASTY Right 10/02/2018   Procedure: TOTAL KNEE ARTHROPLASTY;  Surgeon: Marcene Corning, MD;  Location: [facility] OR;  Service: Orthopedics;  Laterality: Right;   WRIST SURGERY Right    2 Pinns    Current Medications: Current Meds  Medication Sig   amLODipine (NORVASC) 10 MG tablet Take 10 mg by mouth daily.   cloNIDine (CATAPRES) 0.2 MG tablet Take  0.2 mg by mouth 3 (three) times daily.   ELIQUIS 5 MG TABS tablet TAKE 1 TABLET BY MOUTH TWICE DAILY (Patient taking differently: Take 5 mg by mouth 2 (two) times daily.)   furosemide (LASIX) 20 MG tablet Take 80 mg by mouth daily. Take for 1 week per Dr. Kirtland Bouchard   hydrALAZINE (APRESOLINE) 100 MG tablet Take 100 mg by mouth 3 (three) times daily.   labetalol (NORMODYNE) 300 MG tablet Take 1 tablet (300 mg total) by mouth 2 (two) times daily.   lisinopril-hydrochlorothiazide (PRINZIDE,ZESTORETIC) 20-25 MG tablet Take 1 tablet by mouth daily.   Multiple Vitamins-Minerals (MULTIVITAMIN WITH  MINERALS) tablet Take 1 tablet by mouth daily.   oxyCODONE 20 MG TABS Take 1 tablet (20 mg total) by mouth every 4 (four) hours as needed (severe pain).   potassium chloride (K-DUR) 10 MEQ tablet Take 10 mEq by mouth daily.   PROAIR HFA 108 (90 Base) MCG/ACT inhaler Inhale 2 puffs into the lungs 4 (four) times daily as needed for wheezing or shortness of breath.     Allergies:   Patient has no known allergies.   Social History   Socioeconomic History   Marital status: Legally Separated    Spouse name: Not on file   Number of children: 0   Years of education: Not on file   Highest education level: Some college, no degree  Occupational History   Not on file  Tobacco Use   Smoking status: Former    Packs/day: 0.50    Years: 56.00    Pack years: 28.00    Types: Cigarettes    Quit date: 12/25/2020    Years since quitting: 0.8   Smokeless tobacco: Former    Types: Chew   Tobacco comments:    smokes less than 0.5 ppd now  Vaping Use   Vaping Use: Never used  Substance and Sexual Activity   Alcohol use: Yes    Alcohol/week: 12.0 standard drinks    Types: 12 Cans of beer per week   Drug use: No   Sexual activity: Not on file  Other Topics Concern   Not on file  Social History Narrative   Lives alone   "ambidextrous, write with my right hand"   Caffeine: 2 cups coffee/day   Social Determinants of Health   Financial Resource Strain: Not on file  Food Insecurity: Not on file  Transportation Needs: Not on file  Physical Activity: Not on file  Stress: Not on file  Social Connections: Not on file     Family History: The patient's family history includes Cancer in an other family member. There is no history of Heart disease, Headache, or Migraines. ROS:   Please see the history of present illness.    All 14 point review of systems negative except as described per history of present illness  EKGs/Labs/Other Studies Reviewed:      Recent Labs: 06/04/2021: BUN 18;  Creatinine, Ser 0.96; Hemoglobin 11.0; Platelets 204; Potassium 4.0; Sodium 135  Recent Lipid Panel    Component Value Date/Time   CHOL 130 01/29/2021 1159   TRIG 120 01/29/2021 1159   HDL 36 (L) 01/29/2021 1159   CHOLHDL 3.6 01/29/2021 1159   LDLCALC 72 01/29/2021 1159    Physical Exam:    VS:  BP 126/76 (BP Location: Left Arm, Patient Position: Sitting)    Pulse 92    Ht 5\' 10"  (1.778 m)    Wt 270 lb 3.2 oz (122.6 kg)    SpO2  95%    BMI 38.77 kg/m     Wt Readings from Last 3 Encounters:  10/22/21 270 lb 3.2 oz (122.6 kg)  01/29/21 268 lb (121.6 kg)  10/06/20 272 lb (123.4 kg)     GEN:  Well nourished, well developed in no acute distress HEENT: Normal NECK: No JVD; No carotid bruits LYMPHATICS: No lymphadenopathy CARDIAC: Irregularly irregular, no murmurs, no rubs, no gallops RESPIRATORY:  Clear to auscultation without rales, wheezing or rhonchi  ABDOMEN: Soft, non-tender, non-distended MUSCULOSKELETAL:  No edema; No deformity  SKIN: Warm and dry LOWER EXTREMITIES: no swelling NEUROLOGIC:  Alert and oriented x 3 PSYCHIATRIC:  Normal affect   ASSESSMENT:    1. Permanent atrial fibrillation (HCC)   2. Essential hypertension   3. Obstructive sleep apnea   4. Smoking   5. Persistent atrial fibrillation (HCC)   6. Primary hypertension    PLAN:    In order of problems listed above:  Permanent atrial fibrillation rate appears to be controlled on today's EKG.  I will schedule him to have an echocardiogram to assess left ventricle ejection fraction.  I am worried that sometimes his rate is not controlled which can lead to obesity to cardiomyopathy. Essential hypertension: Blood pressure seems to be well controlled continue present management. Dyslipidemia I did review his K PN which show LDL of 72 HDL 36 this is from Jan 29, 2021.  He is on Lipitor 40 which is high intense statin I will continue with that. Smoking likely he quit a year ago and encouraged him to stay away  from smoking.   Medication Adjustments/Labs and Tests Ordered: Current medicines are reviewed at length with the patient today.  Concerns regarding medicines are outlined above.  No orders of the defined types were placed in this encounter.  Medication changes: No orders of the defined types were placed in this encounter.   Signed, Georgeanna Lea, MD, [facility] 10/22/2021 3:10 PM    [facility]

## 2021-10-26 ENCOUNTER — Ambulatory Visit (INDEPENDENT_AMBULATORY_CARE_PROVIDER_SITE_OTHER): Payer: Medicaid Other

## 2021-10-26 ENCOUNTER — Other Ambulatory Visit: Payer: Self-pay

## 2021-10-26 DIAGNOSIS — I10 Essential (primary) hypertension: Secondary | ICD-10-CM | POA: Diagnosis not present

## 2021-10-26 DIAGNOSIS — I4821 Permanent atrial fibrillation: Secondary | ICD-10-CM

## 2021-10-26 DIAGNOSIS — F17200 Nicotine dependence, unspecified, uncomplicated: Secondary | ICD-10-CM | POA: Diagnosis not present

## 2021-10-26 DIAGNOSIS — I4819 Other persistent atrial fibrillation: Secondary | ICD-10-CM

## 2021-10-26 DIAGNOSIS — G4733 Obstructive sleep apnea (adult) (pediatric): Secondary | ICD-10-CM

## 2021-10-26 LAB — ECHOCARDIOGRAM COMPLETE: S' Lateral: 3.5 cm

## 2021-11-05 ENCOUNTER — Telehealth: Payer: Self-pay

## 2021-11-05 NOTE — Telephone Encounter (Signed)
Left multiple message to return my call, as a last attempt I've mailed a letter requesting a call back.

## 2021-11-05 NOTE — Telephone Encounter (Signed)
-----   Message from Georgeanna Lea, MD sent at 11/01/2021  2:35 PM EST ----- Echocardiogram showed preserved left ventricle ejection fraction, moderately dilated left atrium, otherwise normal

## 2021-11-17 ENCOUNTER — Telehealth: Payer: Self-pay | Admitting: Cardiology

## 2021-11-17 NOTE — Telephone Encounter (Signed)
Left VM for pt to call back.

## 2021-11-17 NOTE — Telephone Encounter (Signed)
Patient's wife is calling due to receiving a letter about our office trying to contact them for the patient's echo results. She is requesting her husband be called back since she is leaving for work soon. A detailed message can be left with the results if he does not answer. Please advise.  ?

## 2021-11-17 NOTE — Telephone Encounter (Signed)
Pt came in to go over his echo results,  no nurse available explained to him id have some reach out in regards to that .  ?Pt also states he would like an LE VENOUS procedure done. ? ?Best number [phone]  ?

## 2021-11-18 NOTE — Telephone Encounter (Signed)
Pt states that while having his echo he was telling Laverna Peace that he has pain and discoloration in his legs. Laverna Peace suggested he talk to you regarding a vascular study on his legs. ?

## 2021-11-23 ENCOUNTER — Other Ambulatory Visit: Payer: Self-pay

## 2021-11-23 DIAGNOSIS — I4821 Permanent atrial fibrillation: Secondary | ICD-10-CM

## 2021-11-24 NOTE — Telephone Encounter (Signed)
Patient informed of the date and time of his arterial duplex of the lower extremities. Patient had no further questions at this time. ?

## 2021-11-30 ENCOUNTER — Ambulatory Visit (INDEPENDENT_AMBULATORY_CARE_PROVIDER_SITE_OTHER): Payer: Medicaid Other

## 2021-11-30 DIAGNOSIS — I4821 Permanent atrial fibrillation: Secondary | ICD-10-CM

## 2021-11-30 DIAGNOSIS — M79605 Pain in left leg: Secondary | ICD-10-CM

## 2021-11-30 DIAGNOSIS — M79604 Pain in right leg: Secondary | ICD-10-CM

## 2021-12-16 ENCOUNTER — Other Ambulatory Visit: Payer: Self-pay

## 2021-12-16 DIAGNOSIS — E785 Hyperlipidemia, unspecified: Secondary | ICD-10-CM

## 2021-12-21 ENCOUNTER — Ambulatory Visit (INDEPENDENT_AMBULATORY_CARE_PROVIDER_SITE_OTHER): Payer: Medicaid Other

## 2021-12-21 DIAGNOSIS — R109 Unspecified abdominal pain: Secondary | ICD-10-CM | POA: Diagnosis not present

## 2021-12-21 DIAGNOSIS — E785 Hyperlipidemia, unspecified: Secondary | ICD-10-CM

## 2021-12-23 ENCOUNTER — Telehealth: Payer: Self-pay

## 2021-12-23 NOTE — Telephone Encounter (Signed)
Called pt to discuss results of aortic U/S. He wanted to know what the plan is now that he has completed the Abdominal U/S and the Lower Extremity U/S? ?

## 2022-01-25 ENCOUNTER — Other Ambulatory Visit (HOSPITAL_BASED_OUTPATIENT_CLINIC_OR_DEPARTMENT_OTHER): Payer: Self-pay

## 2022-01-25 MED ORDER — OXYCODONE HCL 20 MG PO TABS
ORAL_TABLET | ORAL | 0 refills | Status: DC
  Filled 2022-01-25: qty 161, 27d supply, fill #0

## 2022-01-25 MED ORDER — OXYCODONE HCL 20 MG PO TABS: ORAL_TABLET | ORAL | 0 refills | Status: DC

## 2022-02-14 ENCOUNTER — Other Ambulatory Visit: Payer: Self-pay

## 2022-02-14 MED ORDER — APIXABAN 5 MG PO TABS: 5.0000 mg | ORAL_TABLET | Freq: Two times a day (BID) | ORAL | 1 refills | Status: DC

## 2022-02-14 NOTE — Telephone Encounter (Signed)
Prescription refill request for Eliquis received. Indication:Afib Last office visit:2/23 Scr:0.9 Age: 56 Weight:122.6 kg  Prescription refilled

## 2022-02-22 ENCOUNTER — Other Ambulatory Visit (HOSPITAL_BASED_OUTPATIENT_CLINIC_OR_DEPARTMENT_OTHER): Payer: Self-pay

## 2022-02-22 MED ORDER — OXYCODONE HCL 20 MG PO TABS
ORAL_TABLET | ORAL | 0 refills | Status: DC
  Filled 2022-02-22 – 2022-03-04 (×2): qty 180, 30d supply, fill #0

## 2022-03-04 ENCOUNTER — Other Ambulatory Visit (HOSPITAL_BASED_OUTPATIENT_CLINIC_OR_DEPARTMENT_OTHER): Payer: Self-pay

## 2022-03-07 ENCOUNTER — Other Ambulatory Visit (HOSPITAL_BASED_OUTPATIENT_CLINIC_OR_DEPARTMENT_OTHER): Payer: Self-pay

## 2022-03-22 ENCOUNTER — Other Ambulatory Visit (HOSPITAL_BASED_OUTPATIENT_CLINIC_OR_DEPARTMENT_OTHER): Payer: Self-pay

## 2022-04-07 ENCOUNTER — Encounter: Payer: Self-pay | Admitting: Cardiology

## 2022-04-07 ENCOUNTER — Ambulatory Visit: Payer: Medicaid Other | Admitting: Cardiology

## 2022-04-21 ENCOUNTER — Encounter: Payer: Self-pay | Admitting: Cardiology

## 2022-04-21 ENCOUNTER — Ambulatory Visit (INDEPENDENT_AMBULATORY_CARE_PROVIDER_SITE_OTHER): Payer: Medicaid Other | Admitting: Cardiology

## 2022-04-21 VITALS — BP 120/70 | HR 74 | Ht 70.0 in | Wt 266.8 lb

## 2022-04-21 DIAGNOSIS — I739 Peripheral vascular disease, unspecified: Secondary | ICD-10-CM | POA: Diagnosis not present

## 2022-04-21 DIAGNOSIS — G4733 Obstructive sleep apnea (adult) (pediatric): Secondary | ICD-10-CM

## 2022-04-21 DIAGNOSIS — I4821 Permanent atrial fibrillation: Secondary | ICD-10-CM

## 2022-04-21 DIAGNOSIS — I10 Essential (primary) hypertension: Secondary | ICD-10-CM | POA: Diagnosis not present

## 2022-04-21 MED ORDER — CLOPIDOGREL BISULFATE 75 MG PO TABS: 75.0000 mg | ORAL_TABLET | Freq: Every day | ORAL | 3 refills | Status: DC

## 2022-04-21 MED ORDER — EZETIMIBE 10 MG PO TABS: 10.0000 mg | ORAL_TABLET | Freq: Every day | ORAL | 3 refills | Status: DC

## 2022-04-21 NOTE — Patient Instructions (Addendum)
Medication Instructions:  Your physician has recommended you make the following change in your medication:   START: Plavix 75mg  1 tablet daily in addition to Eliquis  START: Zetia 10mg  1 daily    Lab Work: None Ordered If you have labs (blood work) drawn today and your tests are completely normal, you will receive your results only by: MyChart Message (if you have MyChart) OR A paper copy in the mail If you have any lab test that is abnormal or we need to change your treatment, we will call you to review the results.   Testing/Procedures: None Ordered   Follow-Up: At [facility], you and your health needs are our priority.  As part of our continuing mission to provide you with exceptional heart care, we have created designated Provider Care Teams.  These Care Teams include your primary Cardiologist (physician) and Advanced Practice Providers (APPs -  Physician Assistants and Nurse Practitioners) who all work together to provide you with the care you need, when you need it.  We recommend signing up for the patient portal called "MyChart".  Sign up information is provided on this After Visit Summary.  MyChart is used to connect with patients for Virtual Visits (Telemedicine).  Patients are able to view lab/test results, encounter notes, upcoming appointments, etc.  Non-urgent messages can be sent to your provider as well.   To learn more about what you can do with MyChart, go to .    Your next appointment:   6 month(s)  The format for your next appointment:   In Person  Provider:   [facility], MD    Other Instructions NA

## 2022-04-21 NOTE — Addendum Note (Signed)
Addended by: Baldo Ash D on: 04/21/2022 04:19 PM   Modules accepted: Orders

## 2022-04-21 NOTE — Progress Notes (Signed)
Cardiology Office Note:    Date:  04/21/2022   ID:  Peter Thompson, DOB 1966-06-01, MRN [id number]  PCP:  Quitman Livings, MD  Cardiologist:  Gypsy Balsam, MD    Referring MD: Quitman Livings, MD   Chief Complaint  Patient presents with   Leg Swelling    History of Present Illness:    Peter Thompson is a 56 y.o. male with complex past medical history getting include permanent atrial fibrillation, years ago he had 1 attempt to cardiovert him very quickly went back to atrial fibrillation since that time did not want to do anything about it.  He is anticoagulated.  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  Recently restarted investigating his vasculature in the lower extremities it look like he does have some inflow disease as well as completely occluded right SFA.  He is coming today to talk about this.  He does not have any wounds on his leg.  He wears military shoes with some socks and does not want to remove his shoes and socks.  Denies having any unhealing ulcer over there.  He does have pain in his legs all the time I am not sure what part of the symptomatology is related to vascular problem.  He got multiple orthopedic injuries to his lower extremities.  That is because of his [employer].  Cardiac wise seems to be doing fine denies have any chest pain tightness squeezing pressure burning chest no palpitations no dizziness.  He was a limited support and upset about the fact that nobody called him and told him about the vascular test.  I apologized to him and explained to him what was fine including the fact that he had completely occluded right SFA.  Then couple minutes later he told me that he wanted somebody to explain him what was wrong with his legs he simply forgot what I just told him moment ago.  He looks sleepy during my interview.  He said he cannot sleep.  He used to take Xanax for  it but now he does not have it.  Past Medical History:  Diagnosis Date   Abnormal results of liver function studies 01/21/2021   Anemia 10/17/2018   Anxiety    Arthritis    Bursitis    Cervicogenic headache 10/06/2020   Complication of anesthesia    "woke up in OR- IV came out."   Concussion    several- head injury- was in [facility]   DJD (degenerative joint disease)    Essential hypertension 10/17/2018   History of arthroscopic procedure on shoulder 01/21/2021   Hypertension    Insomnia    Left-sided epistaxis 01/30/2019   Low back pain 01/21/2021   Neck fracture (HCC)    Obesity 01/21/2021   Obstructive sleep apnea 09/04/2019   Osteoarthrosis, unspecified whether generalized or localized, other specified sites    Other and unspecified hyperlipidemia 01/21/2021   Other specified counseling 01/21/2021   Other, mixed, or unspecified nondependent drug abuse, unspecified 01/21/2021   Paroxysmal atrial fibrillation (HCC) 12/26/2018   Persistent atrial fibrillation (HCC)    Pre-op evaluation 12/05/2016   Primary localized osteoarthritis of left hip 05/10/2016   Primary osteoarthritis of left hip 05/10/2016   Primary osteoarthritis of right knee 10/02/2018   Psychosocial circumstance 01/21/2021   Smoking 12/05/2016   Tinnitus, bilateral     Past Surgical History:  Procedure Laterality Date   ANKLE  SURGERY Left    Arm surgery Right    fracture repair   CERVICAL SPINE SURGERY     bone graft from left hip   HAND SURGERY Left    drains for Infection   HIP SURGERY Left    "scrapped" x2   KNEE ARTHROSCOPY Right    LEG SURGERY Left    "1 inch took out"   SHOULDER ARTHROSCOPY Left 12/27/2016   Procedure: ARTHROSCOPY SHOULDER;  Surgeon: Marcene Corning, MD;  Location[facility] OR;  Service: Orthopedics;  Laterality: Left;  Debridement, AC Decompression, Acromioplasty    TOTAL HIP ARTHROPLASTY Left 05/10/2016   Procedure: TOTAL HIP ARTHROPLASTY ANTERIOR APPROACH;  Surgeon: Marcene Corning, MD;  Location: [facility]  OR;  Service: Orthopedics;  Laterality: Left;   TOTAL KNEE ARTHROPLASTY Right 10/02/2018   Procedure: TOTAL KNEE ARTHROPLASTY;  Surgeon: Marcene Corning, MD;  Location: [facility] OR;  Service: Orthopedics;  Laterality: Right;   WRIST SURGERY Right    2 Pinns    Current Medications: Current Meds  Medication Sig   amLODipine (NORVASC) 10 MG tablet Take 10 mg by mouth daily.   apixaban (ELIQUIS) 5 MG TABS tablet Take 1 tablet (5 mg total) by mouth 2 (two) times daily.   cloNIDine (CATAPRES) 0.2 MG tablet Take 0.2 mg by mouth 3 (three) times daily.   furosemide (LASIX) 20 MG tablet Take 80 mg by mouth daily. Take for 1 week per Dr. Kirtland Bouchard   hydrALAZINE (APRESOLINE) 100 MG tablet Take 100 mg by mouth 3 (three) times daily.   labetalol (NORMODYNE) 300 MG tablet Take 1 tablet (300 mg total) by mouth 2 (two) times daily.   lisinopril-hydrochlorothiazide (PRINZIDE,ZESTORETIC) 20-25 MG tablet Take 1 tablet by mouth daily.   Multiple Vitamins-Minerals (MULTIVITAMIN WITH MINERALS) tablet Take 1 tablet by mouth daily.   oxyCODONE 20 MG TABS Take 1 tablet (20 mg total) by mouth every 4 (four) hours as needed (severe pain).   potassium chloride (K-DUR) 10 MEQ tablet Take 10 mEq by mouth daily.   PROAIR HFA 108 (90 Base) MCG/ACT inhaler Inhale 2 puffs into the lungs 4 (four) times daily as needed for wheezing or shortness of breath.     Allergies:   Patient has no known allergies.   Social History   Socioeconomic History   Marital status: Legally Separated    Spouse name: Not on file   Number of children: 0   Years of education: Not on file   Highest education level: Some college, no degree  Occupational History   Not on file  Tobacco Use   Smoking status: Former    Packs/day: 0.50    Years: 56.00    Total pack years: 28.00    Types: Cigarettes    Quit date: 12/25/2020    Years since quitting: 1.3   Smokeless tobacco: Former    Types: Chew   Tobacco comments:    smokes less than 0.5 ppd now  Vaping  Use   Vaping Use: Never used  Substance and Sexual Activity   Alcohol use: Yes    Alcohol/week: 12.0 standard drinks of alcohol    Types: 12 Cans of beer per week   Drug use: No   Sexual activity: Not on file  Other Topics Concern   Not on file  Social History Narrative   Lives alone   "ambidextrous, write with my right hand"   Caffeine: 2 cups coffee/day   Social Determinants of Health   Financial Resource Strain: Not on file  Food  Insecurity: Not on file  Transportation Needs: Not on file  Physical Activity: Not on file  Stress: Not on file  Social Connections: Not on file     Family History: The patient's family history includes Cancer in an other family member. There is no history of Heart disease, Headache, or Migraines. ROS:   Please see the history of present illness.    All 14 point review of systems negative except as described per history of present illness  EKGs/Labs/Other Studies Reviewed:      Recent Labs: 06/04/2021: BUN 18; Creatinine, Ser 0.96; Hemoglobin 11.0; Platelets 204; Potassium 4.0; Sodium 135  Recent Lipid Panel    Component Value Date/Time   CHOL 130 01/29/2021 1159   TRIG 120 01/29/2021 1159   HDL 36 (L) 01/29/2021 1159   CHOLHDL 3.6 01/29/2021 1159   LDLCALC 72 01/29/2021 1159    Physical Exam:    VS:  BP 120/70 (BP Location: Left Arm, Patient Position: Sitting)   Pulse 74   Ht 5\' 10"  (1.778 m)   Wt 266 lb 12.8 oz (121 kg)   SpO2 94%   BMI 38.28 kg/m     Wt Readings from Last 3 Encounters:  04/21/22 266 lb 12.8 oz (121 kg)  10/22/21 270 lb 3.2 oz (122.6 kg)  01/29/21 268 lb (121.6 kg)     GEN:  Well nourished, well developed in no acute distress HEENT: Normal NECK: No JVD; No carotid bruits LYMPHATICS: No lymphadenopathy CARDIAC: Irregularly irregular, no murmurs, no rubs, no gallops RESPIRATORY:  Clear to auscultation without rales, wheezing or rhonchi  ABDOMEN: Soft, non-tender, non-distended MUSCULOSKELETAL:  No  edema; No deformity  SKIN: Warm and dry LOWER EXTREMITIES: no swelling but he wears military boots and does not want to remove them NEUROLOGIC:  Alert and oriented x 3 PSYCHIATRIC:  Normal affect   ASSESSMENT:    1. Permanent atrial fibrillation (HCC)   2. Primary hypertension   3. Obstructive sleep apnea (adult) (pediatric)   4. Peripheral vascular disease, unspecified (HCC)    PLAN:    In order of problems listed above:  Permanent atrial fibrillation, he is anticoagulated which I will continue. Essential hypertension blood pressure seems to well-controlled continue present management. Peripheral vascular disease, he is on anticoagulation, will add Plavix to his medical regimen.  I will also add Zetia to his medications, in the future we will consider Pletal Obstructive sleep apnea to be followed by primary care physician  Interestingly he will said that he have no more time for a visit today and simply got up and left the office.  Will try to contact him regarding Plavix and Zetia   Medication Adjustments/Labs and Tests Ordered: Current medicines are reviewed at length with the patient today.  Concerns regarding medicines are outlined above.  No orders of the defined types were placed in this encounter.  Medication changes: No orders of the defined types were placed in this encounter.   Signed, 01/31/21, MD, [facility] 04/21/2022 4:07 PM    [facility]

## 2022-05-04 ENCOUNTER — Telehealth: Payer: Self-pay

## 2022-05-04 NOTE — Telephone Encounter (Signed)
Dr. Bing Matter reviewed Sleep study results under media

## 2022-06-06 ENCOUNTER — Telehealth: Payer: Self-pay | Admitting: Cardiology

## 2022-06-06 NOTE — Telephone Encounter (Signed)
Office calling to requesting pt's Echo report. Please advise

## 2022-06-06 NOTE — Telephone Encounter (Signed)
Report faxed to [phone] at [facility].

## 2022-07-11 DIAGNOSIS — G44221 Chronic tension-type headache, intractable: Secondary | ICD-10-CM | POA: Insufficient documentation

## 2022-08-10 ENCOUNTER — Other Ambulatory Visit: Payer: Self-pay | Admitting: *Deleted

## 2022-08-10 DIAGNOSIS — I4819 Other persistent atrial fibrillation: Secondary | ICD-10-CM

## 2022-08-10 MED ORDER — APIXABAN 5 MG PO TABS: 5.0000 mg | ORAL_TABLET | Freq: Two times a day (BID) | ORAL | 1 refills | Status: DC

## 2022-08-10 NOTE — Telephone Encounter (Signed)
Prescription refill request for Eliquis received. Indication: Afib  Last office visit: 04/21/22 Bing Matter)  Scr: 1.12 (07/11/22) Age: 56 Weight: 121kg  Appropriate dose and refill sent to requested pharmacy.

## 2022-11-10 ENCOUNTER — Telehealth: Payer: Self-pay | Admitting: Cardiology

## 2022-11-10 NOTE — Telephone Encounter (Signed)
Patient would like to know if he should be taking Plavix and Eliquis as prescribed. Pt states hee has been told by several pharmacists that he should not be taking two blood thinners. Please advise. Best number [phone]

## 2022-11-10 NOTE — Telephone Encounter (Signed)
LVM for pt to call regarding message

## 2022-11-11 NOTE — Telephone Encounter (Signed)
Advised of last office visit and why he was taking both medications. Pt verbalized understanding and had no additional questions.  Permanent atrial fibrillation, he is anticoagulated which I will continue.  Essential hypertension blood pressure seems to well-controlled continue present management.  Peripheral vascular disease, he is on anticoagulation, will add Plavix to his medical regimen.  I will also add Zetia to his medications  Plavix is in addition to Eliquis.

## 2022-12-16 ENCOUNTER — Ambulatory Visit: Payer: Medicaid Other | Attending: Cardiology | Admitting: Cardiology

## 2022-12-20 ENCOUNTER — Encounter: Payer: Self-pay | Admitting: Cardiology

## 2023-02-10 ENCOUNTER — Inpatient Hospital Stay (HOSPITAL_COMMUNITY)
Admission: EM | Admit: 2023-02-10 | Discharge: 2023-02-15 | DRG: 486 | Disposition: A | Discharge status: Home Health | Payer: Medicaid Other | Attending: Internal Medicine | Admitting: Internal Medicine

## 2023-02-10 ENCOUNTER — Other Ambulatory Visit: Payer: Self-pay | Admitting: Orthopaedic Surgery

## 2023-02-10 ENCOUNTER — Other Ambulatory Visit: Payer: Self-pay

## 2023-02-10 ENCOUNTER — Encounter (HOSPITAL_COMMUNITY): Payer: Self-pay

## 2023-02-10 DIAGNOSIS — E876 Hypokalemia: Secondary | ICD-10-CM | POA: Diagnosis present

## 2023-02-10 DIAGNOSIS — F419 Anxiety disorder, unspecified: Secondary | ICD-10-CM | POA: Diagnosis present

## 2023-02-10 DIAGNOSIS — G4733 Obstructive sleep apnea (adult) (pediatric): Secondary | ICD-10-CM | POA: Diagnosis present

## 2023-02-10 DIAGNOSIS — Z96659 Presence of unspecified artificial knee joint: Secondary | ICD-10-CM

## 2023-02-10 DIAGNOSIS — M00861 Arthritis due to other bacteria, right knee: Secondary | ICD-10-CM | POA: Diagnosis not present

## 2023-02-10 DIAGNOSIS — Z7951 Long term (current) use of inhaled steroids: Secondary | ICD-10-CM

## 2023-02-10 DIAGNOSIS — Z6835 Body mass index (BMI) 35.0-35.9, adult: Secondary | ICD-10-CM | POA: Diagnosis not present

## 2023-02-10 DIAGNOSIS — R7881 Bacteremia: Secondary | ICD-10-CM | POA: Diagnosis present

## 2023-02-10 DIAGNOSIS — G8929 Other chronic pain: Secondary | ICD-10-CM | POA: Diagnosis present

## 2023-02-10 DIAGNOSIS — I480 Paroxysmal atrial fibrillation: Secondary | ICD-10-CM | POA: Diagnosis not present

## 2023-02-10 DIAGNOSIS — F17200 Nicotine dependence, unspecified, uncomplicated: Secondary | ICD-10-CM

## 2023-02-10 DIAGNOSIS — T8459XA Infection and inflammatory reaction due to other internal joint prosthesis, initial encounter: Secondary | ICD-10-CM

## 2023-02-10 DIAGNOSIS — Z7902 Long term (current) use of antithrombotics/antiplatelets: Secondary | ICD-10-CM | POA: Diagnosis not present

## 2023-02-10 DIAGNOSIS — D649 Anemia, unspecified: Secondary | ICD-10-CM | POA: Diagnosis not present

## 2023-02-10 DIAGNOSIS — I739 Peripheral vascular disease, unspecified: Secondary | ICD-10-CM

## 2023-02-10 DIAGNOSIS — Y831 Surgical operation with implant of artificial internal device as the cause of abnormal reaction of the patient, or of later complication, without mention of misadventure at the time of the procedure: Secondary | ICD-10-CM | POA: Diagnosis present

## 2023-02-10 DIAGNOSIS — E785 Hyperlipidemia, unspecified: Secondary | ICD-10-CM | POA: Diagnosis present

## 2023-02-10 DIAGNOSIS — I4819 Other persistent atrial fibrillation: Secondary | ICD-10-CM | POA: Diagnosis present

## 2023-02-10 DIAGNOSIS — E871 Hypo-osmolality and hyponatremia: Secondary | ICD-10-CM | POA: Diagnosis present

## 2023-02-10 DIAGNOSIS — J449 Chronic obstructive pulmonary disease, unspecified: Secondary | ICD-10-CM | POA: Diagnosis present

## 2023-02-10 DIAGNOSIS — Z96642 Presence of left artificial hip joint: Secondary | ICD-10-CM | POA: Diagnosis present

## 2023-02-10 DIAGNOSIS — I70203 Unspecified atherosclerosis of native arteries of extremities, bilateral legs: Secondary | ICD-10-CM | POA: Diagnosis present

## 2023-02-10 DIAGNOSIS — Z7901 Long term (current) use of anticoagulants: Secondary | ICD-10-CM | POA: Diagnosis not present

## 2023-02-10 DIAGNOSIS — Z87891 Personal history of nicotine dependence: Secondary | ICD-10-CM | POA: Diagnosis not present

## 2023-02-10 DIAGNOSIS — A280 Pasteurellosis: Secondary | ICD-10-CM | POA: Diagnosis present

## 2023-02-10 DIAGNOSIS — Z79899 Other long term (current) drug therapy: Secondary | ICD-10-CM

## 2023-02-10 DIAGNOSIS — I10 Essential (primary) hypertension: Secondary | ICD-10-CM | POA: Diagnosis not present

## 2023-02-10 DIAGNOSIS — T8453XA Infection and inflammatory reaction due to internal right knee prosthesis, initial encounter: Principal | ICD-10-CM | POA: Diagnosis present

## 2023-02-10 DIAGNOSIS — M009 Pyogenic arthritis, unspecified: Secondary | ICD-10-CM | POA: Diagnosis present

## 2023-02-10 DIAGNOSIS — IMO0001 Reserved for inherently not codable concepts without codable children: Secondary | ICD-10-CM

## 2023-02-10 DIAGNOSIS — M00061 Staphylococcal arthritis, right knee: Secondary | ICD-10-CM | POA: Diagnosis not present

## 2023-02-10 DIAGNOSIS — E669 Obesity, unspecified: Secondary | ICD-10-CM | POA: Diagnosis present

## 2023-02-10 DIAGNOSIS — R739 Hyperglycemia, unspecified: Secondary | ICD-10-CM | POA: Diagnosis present

## 2023-02-10 LAB — BASIC METABOLIC PANEL
Anion gap: 12 (ref 5–15)
BUN: 19 mg/dL (ref 6–20)
CO2: 29 mmol/L (ref 22–32)
Calcium: 9.1 mg/dL (ref 8.9–10.3)
Chloride: 88 mmol/L — ABNORMAL LOW (ref 98–111)
Creatinine, Ser: 0.78 mg/dL (ref 0.61–1.24)
GFR, Estimated: >60 mL/min (ref >60)
Glucose, Bld: 167 mg/dL — ABNORMAL HIGH (ref 70–99)
Potassium: 3.2 mmol/L — ABNORMAL LOW (ref 3.5–5.1)
Sodium: 129 mmol/L — ABNORMAL LOW (ref 135–145)

## 2023-02-10 LAB — TSH: TSH: 0.471 u[IU]/mL (ref 0.350–4.500)

## 2023-02-10 LAB — CBC
HCT: 34.3 % — ABNORMAL LOW (ref 39.0–52.0)
Hemoglobin: 11.8 g/dL — ABNORMAL LOW (ref 13.0–17.0)
MCH: 29.8 pg (ref 26.0–34.0)
MCHC: 34.4 g/dL (ref 30.0–36.0)
MCV: 86.6 fL (ref 80.0–100.0)
Platelets: 274 10*3/uL (ref 150–400)
RBC: 3.96 MIL/uL — ABNORMAL LOW (ref 4.22–5.81)
RDW: 13.2 % (ref 11.5–15.5)
WBC: 12.7 10*3/uL — ABNORMAL HIGH (ref 4.0–10.5)
nRBC: 0 % (ref 0.0–0.2)

## 2023-02-10 LAB — GLUCOSE, CAPILLARY: Glucose-Capillary: 139 mg/dL — ABNORMAL HIGH (ref 70–99)

## 2023-02-10 LAB — HIV ANTIBODY (ROUTINE TESTING W REFLEX): HIV Screen 4th Generation wRfx: NONREACTIVE

## 2023-02-10 LAB — MAGNESIUM: Magnesium: 2.1 mg/dL (ref 1.7–2.4)

## 2023-02-10 MED ORDER — POTASSIUM CHLORIDE CRYS ER 20 MEQ PO TBCR
40.0000 meq | EXTENDED_RELEASE_TABLET | Freq: Once | ORAL | Status: AC
  Administered 2023-02-10: 40 meq via ORAL
  Filled 2023-02-10: qty 2

## 2023-02-10 MED ORDER — ALBUTEROL SULFATE HFA 108 (90 BASE) MCG/ACT IN AERS: 2.0000 | INHALATION_SPRAY | Freq: Four times a day (QID) | RESPIRATORY_TRACT | Status: DC | PRN

## 2023-02-10 MED ORDER — VANCOMYCIN HCL 1250 MG/250ML IV SOLN
1250.0000 mg | Freq: Two times a day (BID) | INTRAVENOUS | Status: DC
  Administered 2023-02-11: 1250 mg via INTRAVENOUS
  Filled 2023-02-10 (×2): qty 250

## 2023-02-10 MED ORDER — MORPHINE SULFATE (PF) 4 MG/ML IV SOLN
4.0000 mg | Freq: Once | INTRAVENOUS | Status: AC
  Administered 2023-02-10: 4 mg via INTRAVENOUS
  Filled 2023-02-10: qty 1

## 2023-02-10 MED ORDER — SODIUM CHLORIDE 0.9 % IV SOLN: INTRAVENOUS | Status: DC

## 2023-02-10 MED ORDER — EZETIMIBE 10 MG PO TABS: 10.0000 mg | ORAL_TABLET | Freq: Every day | ORAL | Status: DC

## 2023-02-10 MED ORDER — SODIUM CHLORIDE 0.9 % IV SOLN
1.0000 g | INTRAVENOUS | Status: AC
  Administered 2023-02-10: 1 g via INTRAVENOUS
  Filled 2023-02-10: qty 10

## 2023-02-10 MED ORDER — ACETAMINOPHEN 325 MG PO TABS: 650.0000 mg | ORAL_TABLET | Freq: Four times a day (QID) | ORAL | Status: DC | PRN

## 2023-02-10 MED ORDER — LABETALOL HCL 300 MG PO TABS
300.0000 mg | ORAL_TABLET | Freq: Two times a day (BID) | ORAL | Status: DC
  Administered 2023-02-10 – 2023-02-15 (×10): 300 mg via ORAL
  Filled 2023-02-10 (×11): qty 1

## 2023-02-10 MED ORDER — CLONIDINE HCL 0.1 MG PO TABS
0.2000 mg | ORAL_TABLET | Freq: Three times a day (TID) | ORAL | Status: DC
  Administered 2023-02-10 – 2023-02-15 (×16): 0.2 mg via ORAL
  Filled 2023-02-10 (×17): qty 2

## 2023-02-10 MED ORDER — POTASSIUM CHLORIDE CRYS ER 20 MEQ PO TBCR
40.0000 meq | EXTENDED_RELEASE_TABLET | ORAL | Status: AC
  Administered 2023-02-10 (×2): 40 meq via ORAL
  Filled 2023-02-10 (×2): qty 2

## 2023-02-10 MED ORDER — SODIUM CHLORIDE 0.9 % IV SOLN: 2.0000 g | INTRAVENOUS | Status: DC

## 2023-02-10 MED ORDER — FUROSEMIDE 40 MG PO TABS
80.0000 mg | ORAL_TABLET | Freq: Every day | ORAL | Status: DC
  Administered 2023-02-11 – 2023-02-14 (×4): 80 mg via ORAL
  Filled 2023-02-10 (×5): qty 2

## 2023-02-10 MED ORDER — ONDANSETRON HCL 4 MG/2ML IJ SOLN: 4.0000 mg | Freq: Four times a day (QID) | INTRAMUSCULAR | Status: DC | PRN

## 2023-02-10 MED ORDER — ACETAMINOPHEN 650 MG RE SUPP: 650.0000 mg | Freq: Four times a day (QID) | RECTAL | Status: DC | PRN

## 2023-02-10 MED ORDER — VANCOMYCIN HCL 2000 MG/400ML IV SOLN
2000.0000 mg | Freq: Once | INTRAVENOUS | Status: AC
  Administered 2023-02-10: 2000 mg via INTRAVENOUS
  Filled 2023-02-10: qty 400

## 2023-02-10 MED ORDER — SODIUM CHLORIDE 0.9 % IV SOLN
1.0000 g | Freq: Once | INTRAVENOUS | Status: AC
  Administered 2023-02-10: 1 g via INTRAVENOUS
  Filled 2023-02-10: qty 10

## 2023-02-10 MED ORDER — ONDANSETRON HCL 4 MG PO TABS: 4.0000 mg | ORAL_TABLET | Freq: Four times a day (QID) | ORAL | Status: DC | PRN

## 2023-02-10 MED ORDER — OXYCODONE HCL 5 MG PO TABS
20.0000 mg | ORAL_TABLET | ORAL | Status: DC
  Administered 2023-02-10 – 2023-02-15 (×30): 20 mg via ORAL
  Filled 2023-02-10 (×31): qty 4

## 2023-02-10 MED ORDER — OXYCODONE-ACETAMINOPHEN 5-325 MG PO TABS
1.0000 | ORAL_TABLET | Freq: Once | ORAL | Status: AC
  Administered 2023-02-10: 1 via ORAL
  Filled 2023-02-10: qty 1

## 2023-02-10 MED ORDER — HYDRALAZINE HCL 20 MG/ML IJ SOLN: 10.0000 mg | INTRAMUSCULAR | Status: DC | PRN

## 2023-02-10 MED ORDER — ALBUTEROL SULFATE (2.5 MG/3ML) 0.083% IN NEBU: 2.5000 mg | INHALATION_SOLUTION | Freq: Four times a day (QID) | RESPIRATORY_TRACT | Status: DC | PRN

## 2023-02-10 NOTE — Progress Notes (Signed)
Pharmacy Antibiotic Note  Peter Thompson is a 57 y.o. male admitted on 02/10/2023 with  septic joint/PJI .  Pharmacy has been consulted for Vanco dosing.  Active Problem(s): Sent from orthopedic office with  R knee pain, x 2d and swelling. At [facility] 5/31 underwent arthrocentesis of the right knee. They thought that the fluid looked infectious/purulent so they sent him to the ED.   PMH:  PAF on Eliquis, HTN, HLD, obesity, chronic neck pain/fx on opioids (x 78yrs), abnormal LFT's 2022, anemia, anxiety, arthritis, bursitis, HAs, DJD, h/o shoulder surgery, insomnia, OSA, tobacco  ID: r/o PJI, WBC 12.7  5/31: Rocephin>> 5/31: Vanco>>  Plan: Vanco 2g IV x 1 Vancomycin 1250 mg IV Q 12 hrs. Goal AUC 400-550. Expected AUC: 480 SCr used: 0.8  Height: 5\' 10"  (177.8 cm) Weight: 113.4 kg (250 lb) IBW/kg (Calculated) : 73  Temp (24hrs), Avg:98 F (36.7 C), Min:98 F (36.7 C), Max:98 F (36.7 C)  Recent Labs  Lab 02/10/23 1325  WBC 12.7*  CREATININE 0.78    Estimated Creatinine Clearance: 128.5 mL/min (by C-G formula based on SCr of 0.78 mg/dL).    No Known Allergies   S. Merilynn Finland, PharmD, BCPS Clinical Staff Pharmacist [email or web address]  Pasty Spillers 02/10/2023 3:38 PM

## 2023-02-10 NOTE — Progress Notes (Signed)
A consult was received from an ED physician for vancomycin per pharmacy dosing.  The patient's profile has been reviewed for ht/wt/allergies/indication/available labs.    A one time order has been placed for vancomycin 2000 mg IV.    Further antibiotics/pharmacy consults should be ordered by admitting physician if indicated.                       Thank you, Lynden Ang, PharmD, BCPS 02/10/2023  12:58 PM

## 2023-02-10 NOTE — H&P (Addendum)
History and Physical    Peter Thompson ZOX:[id number] DOB: [birth year] DOA: 02/10/2023  PCP: Quitman Livings, MD  Patient coming from: Home  I have personally briefly reviewed patient's old medical records in [facility]  Chief Complaint: Right knee pain  HPI: Peter Thompson is a 57 y.o. male with medical history significant of paroxysmal atrial fibrillation on anticoagulation, hypertension, hyperlipidemia, obesity, chronic neck pain on opioids sent to ED from orthopedics office.  According to patient, he suddenly started right knee pain 2 days ago and then noticed some swelling.  Went to see orthopedics Dr. Jerl Santos in the office, underwent arthrocentesis of the right knee.  They thought that the fluid looked infectious/purulent so they sent him to the ED.  Patient denies any fever, chills, sweating, chest pain, shortness of breath, any trauma to the knee or any other complaint.  He has been on opioids, oxycodone 20 mg p.o. every 4 hours as needed for almost 38 years, when he had neck surgery done since he was in the [employer].  ED Course: Upon arrival to ED, he was hemodynamically stable.  Labs showed mild hyponatremia and mild hypokalemia.  Mild leukocytosis.  He was given Rocephin and vancomycin and hospitalist were consulted for admission.  Per EDP, orthopedics is aware of his hospitalization and they plan to do washout of the knee at some point in time.  Review of Systems: As per HPI otherwise negative.    Past Medical History:  Diagnosis Date   Abnormal results of liver function studies 01/21/2021   Anemia 10/17/2018   Anxiety    Arthritis    Bursitis    Cervicogenic headache 10/06/2020   Complication of anesthesia    "woke up in OR- IV came out."   Concussion    several- head injury- was in milatary   DJD (degenerative joint disease)    Essential hypertension 10/17/2018   History of arthroscopic procedure on shoulder 01/21/2021   Hypertension    Insomnia    Left-sided epistaxis  01/30/2019   Low back pain 01/21/2021   Neck fracture (HCC)    Obesity 01/21/2021   Obstructive sleep apnea 09/04/2019   Osteoarthrosis, unspecified whether generalized or localized, other specified sites    Other and unspecified hyperlipidemia 01/21/2021   Other specified counseling 01/21/2021   Other, mixed, or unspecified nondependent drug abuse, unspecified 01/21/2021   Paroxysmal atrial fibrillation (HCC) 12/26/2018   Persistent atrial fibrillation (HCC)    Pre-op evaluation 12/05/2016   Primary localized osteoarthritis of left hip 05/10/2016   Primary osteoarthritis of left hip 05/10/2016   Primary osteoarthritis of right knee 10/02/2018   Psychosocial circumstance 01/21/2021   Smoking 12/05/2016   Tinnitus, bilateral     Past Surgical History:  Procedure Laterality Date   ANKLE SURGERY Left    Arm surgery Right    fracture repair   CERVICAL SPINE SURGERY     bone graft from left hip   HAND SURGERY Left    drains for Infection   HIP SURGERY Left    "scrapped" x2   KNEE ARTHROSCOPY Right    LEG SURGERY Left    "1 inch took out"   SHOULDER ARTHROSCOPY Left 12/27/2016   Procedure: ARTHROSCOPY SHOULDER;  Surgeon: Marcene Corning, MD;  Location: [facility] OR;  Service: Orthopedics;  Laterality: Left;  Debridement, AC Decompression, Acromioplasty    TOTAL HIP ARTHROPLASTY Left 05/10/2016   Procedure: TOTAL HIP ARTHROPLASTY ANTERIOR APPROACH;  Surgeon: Marcene Corning, MD;  Location: [facility] OR;  Service:  Orthopedics;  Laterality: Left;   TOTAL KNEE ARTHROPLASTY Right 10/02/2018   Procedure: TOTAL KNEE ARTHROPLASTY;  Surgeon: Marcene Corning, MD;  Location: [facility] OR;  Service: Orthopedics;  Laterality: Right;   WRIST SURGERY Right    2 Pinns     reports that he quit smoking about 2 years ago. His smoking use included cigarettes. He has a 28.00 pack-year smoking history. He has quit using smokeless tobacco.  His smokeless tobacco use included chew. He reports current alcohol use of about 12.0 standard  drinks of alcohol per week. He reports that he does not use drugs.  No Known Allergies  Family History  Problem Relation Age of Onset   Cancer Other    Heart disease Neg Hx    Headache Neg Hx    Migraines Neg Hx     Prior to Admission medications   Medication Sig Start Date End Date Taking? Authorizing Provider  amLODipine (NORVASC) 10 MG tablet Take 10 mg by mouth daily.    [provider]  apixaban (ELIQUIS) 5 MG TABS tablet Take 1 tablet (5 mg total) by mouth 2 (two) times daily. 08/10/22   Georgeanna Lea, MD  cloNIDine (CATAPRES) 0.2 MG tablet Take 0.2 mg by mouth 3 (three) times daily.    [provider]  clopidogrel (PLAVIX) 75 MG tablet Take 1 tablet (75 mg total) by mouth daily. 04/21/22   Georgeanna Lea, MD  ezetimibe (ZETIA) 10 MG tablet Take 1 tablet (10 mg total) by mouth daily. 04/21/22   Georgeanna Lea, MD  furosemide (LASIX) 20 MG tablet Take 80 mg by mouth daily. Take for 1 week per Dr. Kirtland Bouchard    [provider]  hydrALAZINE (APRESOLINE) 100 MG tablet Take 100 mg by mouth 3 (three) times daily.    [provider]  labetalol (NORMODYNE) 300 MG tablet Take 1 tablet (300 mg total) by mouth 2 (two) times daily. 10/04/18   Elodia Florence, PA-C  lisinopril-hydrochlorothiazide (PRINZIDE,ZESTORETIC) 20-25 MG tablet Take 1 tablet by mouth daily.    [provider]  Multiple Vitamins-Minerals (MULTIVITAMIN WITH MINERALS) tablet Take 1 tablet by mouth daily.    [provider]  oxyCODONE 20 MG TABS Take 1 tablet (20 mg total) by mouth every 4 (four) hours as needed (severe pain). 10/04/18   Elodia Florence, PA-C  potassium chloride (K-DUR) 10 MEQ tablet Take 10 mEq by mouth daily.    [provider]  PROAIR HFA 108 [phone] Base) MCG/ACT inhaler Inhale 2 puffs into the lungs 4 (four) times daily as needed for wheezing or shortness of breath. 12/29/20   [provider]    Physical Exam: Vitals:   02/10/23 1212  02/10/23 1226  BP: 125/73   Pulse: 82   Resp: 16   Temp: 98 F (36.7 C)   TempSrc: Oral   SpO2: 98%   Weight:  113.4 kg  Height:  5\' 10"  (1.778 m)    Constitutional: NAD, calm, comfortable Vitals:   02/10/23 1212 02/10/23 1226  BP: 125/73   Pulse: 82   Resp: 16   Temp: 98 F (36.7 C)   TempSrc: Oral   SpO2: 98%   Weight:  113.4 kg  Height:  5\' 10"  (1.778 m)   Eyes: PERRL, lids and conjunctivae normal ENMT: Mucous membranes are moist. Posterior pharynx clear of any exudate or lesions.Normal dentition.  Neck: normal, supple, no masses, no thyromegaly Respiratory: clear to auscultation bilaterally, no wheezing, no crackles. Normal respiratory effort. No  accessory muscle use.  Cardiovascular: Regular rate and rhythm, no murmurs / rubs / gallops. No extremity edema. 2+ pedal pulses. No carotid bruits.  Abdomen: no tenderness, no masses palpated. No hepatosplenomegaly. Bowel sounds positive.  Musculoskeletal: no clubbing / cyanosis.  Ace wrap in the right knee.  Right knee appears to be swollen compared to the left knee.  Decreased range of motion due to pain. Skin: no rashes, lesions, ulcers. No induration Neurologic: CN 2-12 grossly intact. Sensation intact, DTR normal. Strength 5/5 in all 4.  Psychiatric: Normal judgment and insight. Alert and oriented x 3. Normal mood.    Labs on Admission: I have personally reviewed following labs and imaging studies  CBC: Recent Labs  Lab 02/10/23 1325  WBC 12.7*  HGB 11.8*  HCT 34.3*  MCV 86.6  PLT 274   Basic Metabolic Panel: Recent Labs  Lab 02/10/23 1325  NA 129*  K 3.2*  CL 88*  CO2 29  GLUCOSE 167*  BUN 19  CREATININE 0.78  CALCIUM 9.1   GFR: Estimated Creatinine Clearance: 128.5 mL/min (by C-G formula based on SCr of 0.78 mg/dL). Liver Function Tests: No results for input(s): "AST", "ALT", "ALKPHOS", "BILITOT", "PROT", "ALBUMIN" in the last 168 hours. No results for input(s): "LIPASE", "AMYLASE" in the last 168  hours. No results for input(s): "AMMONIA" in the last 168 hours. Coagulation Profile: No results for input(s): "INR", "PROTIME" in the last 168 hours. Cardiac Enzymes: No results for input(s): "CKTOTAL", "CKMB", "CKMBINDEX", "TROPONINI" in the last 168 hours. BNP (last 3 results) No results for input(s): "PROBNP" in the last 8760 hours. HbA1C: No results for input(s): "HGBA1C" in the last 72 hours. CBG: No results for input(s): "GLUCAP" in the last 168 hours. Lipid Profile: No results for input(s): "CHOL", "HDL", "LDLCALC", "TRIG", "CHOLHDL", "LDLDIRECT" in the last 72 hours. Thyroid Function Tests: No results for input(s): "TSH", "T4TOTAL", "FREET4", "T3FREE", "THYROIDAB" in the last 72 hours. Anemia Panel: No results for input(s): "VITAMINB12", "FOLATE", "FERRITIN", "TIBC", "IRON", "RETICCTPCT" in the last 72 hours. Urine analysis:    Component Value Date/Time   COLORURINE YELLOW 09/25/2018 1148   APPEARANCEUR CLEAR 09/25/2018 1148   LABSPEC 1.008 09/25/2018 1148   PHURINE 8.0 09/25/2018 1148   GLUCOSEU NEGATIVE 09/25/2018 1148   HGBUR NEGATIVE 09/25/2018 1148   BILIRUBINUR NEGATIVE 09/25/2018 1148   KETONESUR NEGATIVE 09/25/2018 1148   PROTEINUR NEGATIVE 09/25/2018 1148   NITRITE NEGATIVE 09/25/2018 1148   LEUKOCYTESUR NEGATIVE 09/25/2018 1148    Radiological Exams on Admission: No results found.   Assessment/Plan Principal Problem:   Septic arthritis of knee (HCC) Active Problems:   Essential (primary) hypertension   Paroxysmal atrial fibrillation (HCC)   Obesity   Obstructive sleep apnea   Septic right knee: S/p arthrocentesis and orthopedic office as mentioned above.  We do not have access to the labs but per them, this was septic.  Continue Rocephin and vancomycin.  Will await follow-up orthopedic to clarify plans.  Will resume his home oxycodone as needed medications.  Will hold his Eliquis and Plavix until surgical plans are clarified.  Resume scheduled  oxycodone 20 mg every 4 hours.  Essential hypertension: Patient appears to be on several medications including amlodipine, clonidine, hydralazine, labetalol, lisinopril and hydrochlorothiazide.  Current blood pressure 125/73. I will only resume clonidine and labetalol to prevent rebound hypertension and tachycardia but hold the rest of the medications.  Will place on as needed IV hydralazine.  Hyperlipidemia: Resume Zetia.  Obstructive sleep apnea: Unsure whether patient uses  CPAP or not.  Patient slightly groggy during my evaluation.  Hypokalemia: Replenish.   Chronic hyponatremia: Upon chart review, it sounds like he has intermittent chronic hyponatremia.  He is asymptomatic currently.  Repeat labs in the morning.  DVT prophylaxis: SCDs Start: 02/10/23 1517 Code Status: Full code Family Communication: None present at bedside.  Plan of care discussed with patient in length and he verbalized understanding and agreed with it. Disposition Plan: Will remain hospitalized for couple of days. Consults called: Orthopedics  Hughie Closs MD [facility]  *Please note that this is a verbal dictation therefore any spelling or grammatical errors are due to the "[employer]agon [employer] One" system interpretation.  Please page via Amion and do not message via secure chat for urgent patient care matters. Secure chat can be used for non urgent patient care matters. 02/10/2023, 3:22 PM  To contact the attending provider between 7A-7P or the covering provider during after hours 7P-7A, please log into the web site [email or web address]

## 2023-02-10 NOTE — ED Provider Notes (Signed)
[place] EMERGENCY DEPARTMENT AT [facility] Provider Note   CSN: [id number] Arrival date & time: 02/10/23  1207     History {Add pertinent medical, surgical, social history, OB history to HPI:1} Chief Complaint  Patient presents with   Knee Pain    Peter Thompson is a 57 y.o. male.   Knee Pain  Patient has a history of hypertension atrial fibrillation, osteoarthritis and several orthopedic surgeries in the past.  Patient presents to the ED for evaluation of increasing knee swelling.  Patient has had trouble with swelling in his knee recently.  He went to his orthopedic doctor's office today.  They did a joint aspiration and it was purulent in nature.  Patient was instructed to come to the ED to be admitted to the hospital.  Plans are for him to go to the OR this coming week for washout of his knee joint.  Patient denies any fevers.  No chest pain.  No vomiting or diarrhea    Home Medications Prior to Admission medications   Medication Sig Start Date End Date Taking? Authorizing Provider  amLODipine (NORVASC) 10 MG tablet Take 10 mg by mouth daily.    [provider]  apixaban (ELIQUIS) 5 MG TABS tablet Take 1 tablet (5 mg total) by mouth 2 (two) times daily. 08/10/22   Georgeanna Lea, MD  cloNIDine (CATAPRES) 0.2 MG tablet Take 0.2 mg by mouth 3 (three) times daily.    [provider]  clopidogrel (PLAVIX) 75 MG tablet Take 1 tablet (75 mg total) by mouth daily. 04/21/22   Georgeanna Lea, MD  ezetimibe (ZETIA) 10 MG tablet Take 1 tablet (10 mg total) by mouth daily. 04/21/22   Georgeanna Lea, MD  furosemide (LASIX) 20 MG tablet Take 80 mg by mouth daily. Take for 1 week per Dr. Kirtland Bouchard    [provider]  hydrALAZINE (APRESOLINE) 100 MG tablet Take 100 mg by mouth 3 (three) times daily.    [provider]  labetalol (NORMODYNE) 300 MG tablet Take 1 tablet (300 mg total) by mouth 2 (two) times daily. 10/04/18   Elodia Florence,  PA-C  lisinopril-hydrochlorothiazide (PRINZIDE,ZESTORETIC) 20-25 MG tablet Take 1 tablet by mouth daily.    [provider]  Multiple Vitamins-Minerals (MULTIVITAMIN WITH MINERALS) tablet Take 1 tablet by mouth daily.    [provider]  oxyCODONE 20 MG TABS Take 1 tablet (20 mg total) by mouth every 4 (four) hours as needed (severe pain). 10/04/18   Elodia Florence, PA-C  potassium chloride (K-DUR) 10 MEQ tablet Take 10 mEq by mouth daily.    [provider]  PROAIR HFA 108 [phone] Base) MCG/ACT inhaler Inhale 2 puffs into the lungs 4 (four) times daily as needed for wheezing or shortness of breath. 12/29/20   [provider]      Allergies    Patient has no known allergies.    Review of Systems   Review of Systems  Physical Exam Updated Vital Signs BP 125/73 (BP Location: Left Arm)   Pulse 82   Temp 98 F (36.7 C) (Oral)   Resp 16   Ht 1.778 m (5\' 10" )   Wt 113.4 kg   SpO2 98%   BMI 35.87 kg/m  Physical Exam Vitals and nursing note reviewed.  Constitutional:      General: He is not in acute distress.    Appearance: He is well-developed.  HENT:     Head: Normocephalic and atraumatic.  Right Ear: External ear normal.     Left Ear: External ear normal.  Eyes:     General: No scleral icterus.       Right eye: No discharge.        Left eye: No discharge.     Conjunctiva/sclera: Conjunctivae normal.  Neck:     Trachea: No tracheal deviation.  Cardiovascular:     Rate and Rhythm: Normal rate and regular rhythm.  Pulmonary:     Effort: Pulmonary effort is normal. No respiratory distress.     Breath sounds: Normal breath sounds. No stridor. No wheezing or rales.  Abdominal:     General: Bowel sounds are normal. There is no distension.     Palpations: Abdomen is soft.     Tenderness: There is no abdominal tenderness. There is no guarding or rebound.  Musculoskeletal:        General: No deformity.     Cervical back: Neck supple.     Right  knee: Effusion present. Tenderness present.     Comments: Chronic venous stasis changes noted bilateral lower extremities, hyperpigmentation below the knee  Skin:    General: Skin is warm and dry.     Findings: No rash.  Neurological:     General: No focal deficit present.     Mental Status: He is alert.     Cranial Nerves: No cranial nerve deficit, dysarthria or facial asymmetry.     Sensory: No sensory deficit.     Motor: No abnormal muscle tone or seizure activity.     Coordination: Coordination normal.  Psychiatric:        Mood and Affect: Mood normal.     ED Results / Procedures / Treatments   Labs (all labs ordered are listed, but only abnormal results are displayed) Labs Reviewed - No data to display  EKG None  Radiology No results found.  Procedures Procedures  {Document cardiac monitor, telemetry assessment procedure when appropriate:1}  Medications Ordered in ED Medications  cefTRIAXone (ROCEPHIN) 1 g in sodium chloride 0.9 % 100 mL IVPB (has no administration in time range)    ED Course/ Medical Decision Making/ A&P Clinical Course as of 02/10/23 1234  Fri Feb 10, 2023  1229 Case discussed with PA Greig Castilla with Dr Hurman Horn office.  Pt had his joint aspirated in the office.  Appeared infectious, purulent.  Plans for OR this coming week.  Requests medical admission. [JK]    Clinical Course User Index [JK] Linwood Dibbles, MD   {   Click here for ABCD2, HEART and other calculatorsREFRESH Note before signing :1}                          Medical Decision Making Amount and/or Complexity of Data Reviewed Labs: ordered.   ***  {Document critical care time when appropriate:1} {Document review of labs and clinical decision tools ie heart score, Chads2Vasc2 etc:1}  {Document your independent review of radiology images, and any outside records:1} {Document your discussion with family members, caretakers, and with consultants:1} {Document social determinants of  health affecting pt's care:1} {Document your decision making why or why not admission, treatments were needed:1} Final Clinical Impression(s) / ED Diagnoses Final diagnoses:  None    Rx / DC Orders ED Discharge Orders     None

## 2023-02-10 NOTE — ED Notes (Signed)
ED TO INPATIENT HANDOFF REPORT  ED Nurse Name and Phone #:    S Name/Age/Gender Peter Thompson 57 y.o. male Room/Bed: WA11/WA11  Code Status   Code Status: Full Code  Home/SNF/Other   Patient oriented to: self, place, time, and situation Is this baseline? Yes   Triage Complete: Triage complete  Chief Complaint Septic arthritis of knee [facility]) [M00.9]  Triage Note Patient said he had a right knee replacement 5 years ago. Is scheduled for surgery on Tuesday. Went to orthopedic doctor earlier today and had fluid drained in his knee. Increased pain and swelling to the knee.   Allergies No Known Allergies  Level of Care/Admitting Diagnosis ED Disposition     ED Disposition  Admit   Condition  --   Comment  Hospital Area: [facility] [[id number]]  Level of Care: Med-Surg [16]  May admit patient to [facility] or [facility] if equivalent level of care is available:: No  Covid Evaluation: Asymptomatic - no recent exposure (last 10 days) testing not required  Diagnosis: Septic arthritis of knee [facility]) [[id number]]  Admitting Physician: Hughie Closs [[id number]]  Attending Physician: Hughie Closs [phone]  Certification:: I certify this patient will need inpatient services for at least 2 midnights  Estimated Length of Stay: 2          B Medical/Surgery History Past Medical History:  Diagnosis Date   Abnormal results of liver function studies 01/21/2021   Anemia 10/17/2018   Anxiety    Arthritis    Bursitis    Cervicogenic headache 10/06/2020   Complication of anesthesia    "woke up in OR- IV came out."   Concussion    several- head injury- was in milatary   DJD (degenerative joint disease)    Essential hypertension 10/17/2018   History of arthroscopic procedure on shoulder 01/21/2021   Hypertension    Insomnia    Left-sided epistaxis 01/30/2019   Low back pain 01/21/2021   Neck fracture (HCC)    Obesity 01/21/2021   Obstructive sleep apnea 09/04/2019    Osteoarthrosis, unspecified whether generalized or localized, other specified sites    Other and unspecified hyperlipidemia 01/21/2021   Other specified counseling 01/21/2021   Other, mixed, or unspecified nondependent drug abuse, unspecified 01/21/2021   Paroxysmal atrial fibrillation (HCC) 12/26/2018   Persistent atrial fibrillation (HCC)    Pre-op evaluation 12/05/2016   Primary localized osteoarthritis of left hip 05/10/2016   Primary osteoarthritis of left hip 05/10/2016   Primary osteoarthritis of right knee 10/02/2018   Psychosocial circumstance 01/21/2021   Smoking 12/05/2016   Tinnitus, bilateral    Past Surgical History:  Procedure Laterality Date   ANKLE SURGERY Left    Arm surgery Right    fracture repair   CERVICAL SPINE SURGERY     bone graft from left hip   HAND SURGERY Left    drains for Infection   HIP SURGERY Left    "scrapped" x2   KNEE ARTHROSCOPY Right    LEG SURGERY Left    "1 inch took out"   SHOULDER ARTHROSCOPY Left 12/27/2016   Procedure: ARTHROSCOPY SHOULDER;  Surgeon: Marcene Corning, MD;  Location[facility] OR;  Service: Orthopedics;  Laterality: Left;  Debridement, AC Decompression, Acromioplasty    TOTAL HIP ARTHROPLASTY Left 05/10/2016   Procedure: TOTAL HIP ARTHROPLASTY ANTERIOR APPROACH;  Surgeon: Marcene Corning, MD;  Location: [facility] OR;  Service: Orthopedics;  Laterality: Left;   TOTAL KNEE ARTHROPLASTY Right 10/02/2018   Procedure: TOTAL KNEE ARTHROPLASTY;  Surgeon: Marcene Corning, MD;  Location: [facility] OR;  Service: Orthopedics;  Laterality: Right;   WRIST SURGERY Right    2 Pinns     A IV Location/Drains/Wounds Patient Lines/Drains/Airways Status     Active Line/Drains/Airways     Name Placement date Placement time Site Days   Peripheral IV 02/10/23 20 G 2.5" Anterior;Proximal;Right Forearm 02/10/23  1323  Forearm  less than 1   Peripheral IV 02/10/23 20 G 1.88" Anterior;Left;Proximal Forearm 02/10/23  1328  Forearm  less than 1   Incision (Closed)  05/10/16 Hip Left 05/10/16  1302  -- 2467   Incision (Closed) 12/27/16 Shoulder 12/27/16  1220  -- 2236   Incision (Closed) 10/02/18 Knee Right 10/02/18  1325  -- 1592            Intake/Output Last 24 hours No intake or output data in the 24 hours ending 02/10/23 1620  Labs/Imaging Results for orders placed or performed during the hospital encounter of 02/10/23 (from the past 48 hour(s))  CBC     Status: Abnormal   Collection Time: 02/10/23  1:25 PM  Result Value Ref Range   WBC 12.7 (H) 4.0 - 10.5 K/uL   RBC 3.96 (L) 4.22 - 5.81 MIL/uL   Hemoglobin 11.8 (L) 13.0 - 17.0 g/dL   HCT 16.1 (L) 09.6 - 04.5 %   MCV 86.6 80.0 - 100.0 fL   MCH 29.8 26.0 - 34.0 pg   MCHC 34.4 30.0 - 36.0 g/dL   RDW 40.9 81.1 - 91.4 %   Platelets 274 150 - 400 K/uL   nRBC 0.0 0.0 - 0.2 %    Comment: Performed at [facility], [address], [place], [place] [address]  Basic metabolic panel     Status: Abnormal   Collection Time: 02/10/23  1:25 PM  Result Value Ref Range   Sodium 129 (L) 135 - 145 mmol/L   Potassium 3.2 (L) 3.5 - 5.1 mmol/L   Chloride 88 (L) 98 - 111 mmol/L   CO2 29 22 - 32 mmol/L   Glucose, Bld 167 (H) 70 - 99 mg/dL    Comment: Glucose reference range applies only to samples taken after fasting for at least 8 hours.   BUN 19 6 - 20 mg/dL   Creatinine, Ser 6.21 0.61 - 1.24 mg/dL   Calcium 9.1 8.9 - 30.8 mg/dL   GFR, Estimated >65 >78 mL/min    Comment: (NOTE) Calculated using the CKD-EPI Creatinine Equation (2021)    Anion gap 12 5 - 15    Comment: Performed at [facility], [address] [place], [place] [address]   No results found.  Pending Labs Unresulted Labs (From admission, onward)     Start     Ordered   02/11/23 0500  CBC  Tomorrow morning,   R        02/10/23 1518   02/11/23 0500  Basic metabolic panel  Tomorrow morning,   R        02/10/23 1518   02/10/23 1518  Magnesium  Once,   R        02/10/23 1518   02/10/23  1518  TSH  Once,   R        02/10/23 1518   02/10/23 1517  HIV Antibody (routine testing w rflx)  (HIV Antibody (Routine testing w reflex) panel)  Once,   R        02/10/23 1518   02/10/23 1232  Blood  culture (routine x 2)  BLOOD CULTURE X 2,   R (with STAT occurrences)      02/10/23 1234            Vitals/Pain Today's Vitals   02/10/23 1226 02/10/23 1530 02/10/23 1612 02/10/23 1617  BP:  133/81    Pulse:  89    Resp:      Temp:   97.6 F (36.4 C)   TempSrc:   Oral   SpO2:  97%    Weight: 113.4 kg     Height: 5\' 10"  (1.778 m)     PainSc: 10-Worst pain ever   8     Isolation Precautions No active isolations  Medications Medications  vancomycin (VANCOREADY) IVPB 2000 mg/400 mL (2,000 mg Intravenous New Bag/Given 02/10/23 1436)  potassium chloride SA (KLOR-CON M) CR tablet 40 mEq (has no administration in time range)  Oxycodone HCl TABS 20 mg (has no administration in time range)  cloNIDine (CATAPRES) tablet 0.2 mg (has no administration in time range)  furosemide (LASIX) tablet 80 mg (has no administration in time range)  labetalol (NORMODYNE) tablet 300 mg (has no administration in time range)  albuterol (VENTOLIN HFA) 108 (90 Base) MCG/ACT inhaler 2 puff (has no administration in time range)  0.9 %  sodium chloride infusion (has no administration in time range)  acetaminophen (TYLENOL) tablet 650 mg (has no administration in time range)    Or  acetaminophen (TYLENOL) suppository 650 mg (has no administration in time range)  ondansetron (ZOFRAN) tablet 4 mg (has no administration in time range)    Or  ondansetron (ZOFRAN) injection 4 mg (has no administration in time range)  hydrALAZINE (APRESOLINE) injection 10 mg (has no administration in time range)  cefTRIAXone (ROCEPHIN) 1 g in sodium chloride 0.9 % 100 mL IVPB (has no administration in time range)  cefTRIAXone (ROCEPHIN) 2 g in sodium chloride 0.9 % 100 mL IVPB (has no administration in time range)  vancomycin  (VANCOREADY) IVPB 1250 mg/250 mL (has no administration in time range)  cefTRIAXone (ROCEPHIN) 1 g in sodium chloride 0.9 % 100 mL IVPB (0 g Intravenous Stopped 02/10/23 1432)  oxyCODONE-acetaminophen (PERCOCET/ROXICET) 5-325 MG per tablet 1 tablet (1 tablet Oral Given 02/10/23 1354)  morphine (PF) 4 MG/ML injection 4 mg (4 mg Intravenous Given 02/10/23 1556)  potassium chloride SA (KLOR-CON M) CR tablet 40 mEq (40 mEq Oral Given 02/10/23 1605)    Mobility walks with device     Focused Assessments     R Recommendations: See Admitting Provider Note  Report given to:   Additional Notes:

## 2023-02-10 NOTE — Consult Note (Signed)
Reason for Consult:Right TKA infection Referring Physician: Dr Jerl Santos, medicine  Peter Thompson is an 57 y.o. male.  HPI: Peter Thompson is a 57 y.o. male with medical history significant of paroxysmal atrial fibrillation on anticoagulation, hypertension, hyperlipidemia, obesity, chronic neck pain on opioids sent to ED from orthopedics office.  According to patient, he suddenly started right knee pain 2 days ago and then noticed some swelling.  Went to see orthopedics Dr. Jerl Santos in the office, underwent arthrocentesis of the right knee.  They thought that the fluid looked infectious/purulent so they sent him to the ED.  Patient denies any fever, chills, sweating, chest pain, shortness of breath, any trauma to the knee or any other complaint.  He has been on opioids, oxycodone 20 mg p.o. every 4 hours as needed for almost 38 years.  Past Medical History:  Diagnosis Date   Abnormal results of liver function studies 01/21/2021   Anemia 10/17/2018   Anxiety    Arthritis    Bursitis    Cervicogenic headache 10/06/2020   Complication of anesthesia    "woke up in OR- IV came out."   Concussion    several- head injury- was in mil[facility]ary   DJD (degenerative joint disease)    Essential hypertension 10/17/2018   History of arthroscopic procedure on shoulder 01/21/2021   Hypertension    Insomnia    Left-sided epistaxis 01/30/2019   Low back pain 01/21/2021   Neck fracture (HCC)    Obesity 01/21/2021   Obstructive sleep apnea 09/04/2019   Osteoarthrosis, unspecified whether generalized or localized, other specified sites    Other and unspecified hyperlipidemia 01/21/2021   Other specified counseling 01/21/2021   Other, mixed, or unspecified nondependent drug abuse, unspecified 01/21/2021   Paroxysmal atrial fibrillation (HCC) 12/26/2018   Persistent atrial fibrillation (HCC)    Pre-op evaluation 12/05/2016   Primary localized osteoarthritis of left hip 05/10/2016   Primary osteoarthritis of left hip  05/10/2016   Primary osteoarthritis of right knee 10/02/2018   Psychosocial circumstance 01/21/2021   Smoking 12/05/2016   Tinnitus, bilateral     Past Surgical History:  Procedure Laterality Date   ANKLE SURGERY Left    Arm surgery Right    fracture repair   CERVICAL SPINE SURGERY     bone graft from left hip   HAND SURGERY Left    drains for Infection   HIP SURGERY Left    "scrapped" x2   KNEE ARTHROSCOPY Right    LEG SURGERY Left    "1 inch took out"   SHOULDER ARTHROSCOPY Left 12/27/2016   Procedure: ARTHROSCOPY SHOULDER;  Surgeon: Marcene Corning, MD;  Location: [facility] OR;  Service: Orthopedics;  Laterality: Left;  Debridement, AC Decompression, Acromioplasty    TOTAL HIP ARTHROPLASTY Left 05/10/2016   Procedure: TOTAL HIP ARTHROPLASTY ANTERIOR APPROACH;  Surgeon: Marcene Corning, MD;  Location: [facility] OR;  Service: Orthopedics;  Laterality: Left;   TOTAL KNEE ARTHROPLASTY Right 10/02/2018   Procedure: TOTAL KNEE ARTHROPLASTY;  Surgeon: Marcene Corning, MD;  Location: [facility] OR;  Service: Orthopedics;  Laterality: Right;   WRIST SURGERY Right    2 Pinns    Family History  Problem Relation Age of Onset   Cancer Other    Heart disease Neg Hx    Headache Neg Hx    Migraines Neg Hx     Social History:  reports that he quit smoking about 2 years ago. His smoking use included cigarettes. He has a 28.00 pack-year smoking history. He has  quit using smokeless tobacco.  His smokeless tobacco use included chew. He reports current alcohol use of about 12.0 standard drinks of alcohol per week. He reports that he does not use drugs.  Allergies: No Known Allergies  Medications: I have reviewed the patient's current medications.  Results for orders placed or performed during the hospital encounter of 02/10/23 (from the past 48 hour(s))  CBC     Status: Abnormal   Collection Time: 02/10/23  1:25 PM  Result Value Ref Range   WBC 12.7 (H) 4.0 - 10.5 K/uL   RBC 3.96 (L) 4.22 - 5.81 MIL/uL   Hemoglobin  11.8 (L) 13.0 - 17.0 g/dL   HCT 16.1 (L) 09.6 - 04.5 %   MCV 86.6 80.0 - 100.0 fL   MCH 29.8 26.0 - 34.0 pg   MCHC 34.4 30.0 - 36.0 g/dL   RDW 40.9 81.1 - 91.4 %   Platelets 274 150 - 400 K/uL   nRBC 0.0 0.0 - 0.2 %    Comment: Performed at [facility], [address] [place], [place] [address]  Basic metabolic panel     Status: Abnormal   Collection Time: 02/10/23  1:25 PM  Result Value Ref Range   Sodium 129 (L) 135 - 145 mmol/L   Potassium 3.2 (L) 3.5 - 5.1 mmol/L   Chloride 88 (L) 98 - 111 mmol/L   CO2 29 22 - 32 mmol/L   Glucose, Bld 167 (H) 70 - 99 mg/dL    Comment: Glucose reference range applies only to samples taken after fasting for at least 8 hours.   BUN 19 6 - 20 mg/dL   Creatinine, Ser 6.21 0.61 - 1.24 mg/dL   Calcium 9.1 8.9 - 30.8 mg/dL   GFR, Estimated >65 >78 mL/min    Comment: (NOTE) Calculated using the CKD-EPI Creatinine Equation (2021)    Anion gap 12 5 - 15    Comment: Performed at [facility], [address] [place], [place] [address]  Blood culture (routine x 2)     Status: None (Preliminary result)   Collection Time: 02/10/23  1:25 PM   Specimen: BLOOD LEFT FOREARM  Result Value Ref Range   Specimen Description      BLOOD LEFT FOREARM Performed at [facility], [address], [place], [place] [address]    Special Requests      BOTTLES DRAWN AEROBIC AND ANAEROBIC Blood Culture results may not be optimal due to an excessive volume of blood received in culture bottles Performed at [facility], [address] [place], [place] [address]    Culture PENDING    Report Status PENDING   Blood culture (routine x 2)     Status: None (Preliminary result)   Collection Time: 02/10/23  1:30 PM   Specimen: BLOOD RIGHT FOREARM  Result Value Ref Range   Specimen Description      BLOOD RIGHT FOREARM Performed at [facility], [address], [place], [place] [address]    Special Requests       BOTTLES DRAWN AEROBIC AND ANAEROBIC Blood Culture results may not be optimal due to an excessive volume of blood received in culture bottles Performed at [facility], [address] [place], [place] [address]    Culture PENDING    Report Status PENDING       Review of Systems  Constitutional:  Negative for chills and fever.  Respiratory:  Negative for shortness  of breath and wheezing.   Cardiovascular:  Positive for leg swelling. Negative for chest pain.  Musculoskeletal:  Positive for arthralgias, gait problem, joint swelling and neck pain.  Neurological:  Negative for dizziness and weakness.  Psychiatric/Behavioral:  Negative for behavioral problems and confusion.    Blood pressure 133/81, pulse 89, temperature 97.6 F (36.4 C), temperature source Oral, resp. rate 16, height 5\' 10"  (1.778 m), weight 113.4 kg, SpO2 97 %. Physical Exam Constitutional:      General: He is not in acute distress.    Appearance: Normal appearance. He is not ill-appearing.  HENT:     Head: Normocephalic and atraumatic.  Eyes:     Extraocular Movements: Extraocular movements intact.  Cardiovascular:     Rate and Rhythm: Normal rate and regular rhythm.  Pulmonary:     Effort: Pulmonary effort is normal.  Musculoskeletal:     Right knee: Swelling, effusion and erythema present. Decreased range of motion. Tenderness present.     Right lower leg: Edema present.     Left lower leg: Edema present.  Skin:    Capillary Refill: Capillary refill takes 2 to 3 seconds.  Neurological:     General: No focal deficit present.     Mental Status: He is alert and oriented to person, place, and time. Mental status is at baseline.     Assessment/Plan: Septic right total knee  Patient has a very high suspicion of infection of the total knee given the increased pain, the wounds on both legs, and the aspirate from the office. Plan for OR on Tuesday for hardware removal and placement of  antibiotic spacer. Patient to be NPO after midnight on Tuesday morning. Eliquis will likely need to be held in preparation for surgery but will defer to medicine.     L. Porterfield, PA-C 02/10/2023, 4:47 PM

## 2023-02-10 NOTE — ED Triage Notes (Signed)
Patient said he had a right knee replacement 5 years ago. Is scheduled for surgery on Tuesday. Went to orthopedic doctor earlier today and had fluid drained in his knee. Increased pain and swelling to the knee.

## 2023-02-11 DIAGNOSIS — T8459XA Infection and inflammatory reaction due to other internal joint prosthesis, initial encounter: Secondary | ICD-10-CM | POA: Diagnosis not present

## 2023-02-11 DIAGNOSIS — T8453XA Infection and inflammatory reaction due to internal right knee prosthesis, initial encounter: Principal | ICD-10-CM

## 2023-02-11 DIAGNOSIS — I739 Peripheral vascular disease, unspecified: Secondary | ICD-10-CM

## 2023-02-11 DIAGNOSIS — Z96659 Presence of unspecified artificial knee joint: Secondary | ICD-10-CM

## 2023-02-11 DIAGNOSIS — R7881 Bacteremia: Secondary | ICD-10-CM | POA: Diagnosis not present

## 2023-02-11 DIAGNOSIS — I480 Paroxysmal atrial fibrillation: Secondary | ICD-10-CM | POA: Diagnosis not present

## 2023-02-11 LAB — BASIC METABOLIC PANEL
Anion gap: 12 (ref 5–15)
BUN: 20 mg/dL (ref 6–20)
CO2: 24 mmol/L (ref 22–32)
Calcium: 9.3 mg/dL (ref 8.9–10.3)
Chloride: 93 mmol/L — ABNORMAL LOW (ref 98–111)
Creatinine, Ser: 0.75 mg/dL (ref 0.61–1.24)
GFR, Estimated: >60 mL/min (ref >60)
Glucose, Bld: 108 mg/dL — ABNORMAL HIGH (ref 70–99)
Potassium: 3.9 mmol/L (ref 3.5–5.1)
Sodium: 129 mmol/L — ABNORMAL LOW (ref 135–145)

## 2023-02-11 LAB — BLOOD CULTURE ID PANEL (REFLEXED) - BCID2

## 2023-02-11 LAB — CBC
HCT: 35.4 % — ABNORMAL LOW (ref 39.0–52.0)
Hemoglobin: 12.2 g/dL — ABNORMAL LOW (ref 13.0–17.0)
MCH: 30 pg (ref 26.0–34.0)
MCHC: 34.5 g/dL (ref 30.0–36.0)
MCV: 87.2 fL (ref 80.0–100.0)
Platelets: 281 10*3/uL (ref 150–400)
RBC: 4.06 MIL/uL — ABNORMAL LOW (ref 4.22–5.81)
RDW: 13.4 % (ref 11.5–15.5)
WBC: 8.7 10*3/uL (ref 4.0–10.5)
nRBC: 0 % (ref 0.0–0.2)

## 2023-02-11 LAB — CULTURE, BLOOD (ROUTINE X 2)

## 2023-02-11 LAB — SODIUM, URINE, RANDOM: Sodium, Ur: 80 mmol/L

## 2023-02-11 MED ORDER — ACETAMINOPHEN 325 MG PO TABS: 650.0000 mg | ORAL_TABLET | Freq: Four times a day (QID) | ORAL | Status: DC | PRN

## 2023-02-11 MED ORDER — ACETAMINOPHEN 500 MG PO TABS
1000.0000 mg | ORAL_TABLET | Freq: Three times a day (TID) | ORAL | Status: AC
  Administered 2023-02-11 – 2023-02-14 (×9): 1000 mg via ORAL
  Filled 2023-02-11 (×9): qty 2

## 2023-02-11 MED ORDER — SODIUM CHLORIDE 0.9 % IV SOLN
2.0000 g | Freq: Three times a day (TID) | INTRAVENOUS | Status: DC
  Administered 2023-02-11 – 2023-02-12 (×4): 2 g via INTRAVENOUS
  Filled 2023-02-11 (×5): qty 12.5

## 2023-02-11 MED ORDER — OXYCODONE HCL 5 MG PO TABS: 10.0000 mg | ORAL_TABLET | Freq: Four times a day (QID) | ORAL | Status: DC | PRN

## 2023-02-11 MED ORDER — OXYCODONE HCL 5 MG PO TABS: 5.0000 mg | ORAL_TABLET | Freq: Four times a day (QID) | ORAL | Status: DC | PRN

## 2023-02-11 MED ORDER — AMLODIPINE BESYLATE 10 MG PO TABS
10.0000 mg | ORAL_TABLET | Freq: Every day | ORAL | Status: DC
  Administered 2023-02-11 – 2023-02-15 (×5): 10 mg via ORAL
  Filled 2023-02-11 (×5): qty 1

## 2023-02-11 MED ORDER — PANTOPRAZOLE SODIUM 40 MG PO TBEC
40.0000 mg | DELAYED_RELEASE_TABLET | Freq: Every day | ORAL | Status: DC
  Administered 2023-02-11 – 2023-02-15 (×5): 40 mg via ORAL
  Filled 2023-02-11 (×5): qty 1

## 2023-02-11 MED ORDER — ALPRAZOLAM 0.5 MG PO TABS
1.0000 mg | ORAL_TABLET | Freq: Once | ORAL | Status: AC
  Administered 2023-02-11: 1 mg via ORAL
  Filled 2023-02-11: qty 2

## 2023-02-11 MED ORDER — KETOROLAC TROMETHAMINE 15 MG/ML IJ SOLN
15.0000 mg | Freq: Four times a day (QID) | INTRAMUSCULAR | Status: DC
  Administered 2023-02-11 – 2023-02-15 (×16): 15 mg via INTRAVENOUS
  Filled 2023-02-11 (×16): qty 1

## 2023-02-11 NOTE — Progress Notes (Signed)
PATIENT ID: Peter Thompson  MRN: [id number]  DOB/AGE:  1966/07/12 / 57 y.o.        PROGRESS NOTE Subjective:   Patient is alert, oriented, no Nausea, no Vomiting, yes passing gas, no Bowel Movement. Taking PO well. Denies SOB, Chest or Calf Pain. Using Incentive Spirometer, PAS in place. Ambulate WBAT , Patient reports pain as 8 on 0-10 scale,    Objective: Vital signs in last 24 hours: Temp:  [97.5 F (36.4 C)-98.3 F (36.8 C)] 98.3 F (36.8 C) (06/01 0455) Pulse Rate:  [82-91] 84 (06/01 0455) Resp:  [16-20] 20 (06/01 0455) BP: (116-133)/(62-91) 120/70 (06/01 0455) SpO2:  [97 %-99 %] 97 % (06/01 0455) Weight:  [113.4 kg] 113.4 kg (05/31 1226)    Intake/Output from previous day: I/O last 3 completed shifts: In: 1652.1 [I.V.:1000; IV Piggyback:652.1] Out: -    Intake/Output this shift: No intake/output data recorded.   LABORATORY DATA: Recent Labs    02/10/23 1325 02/10/23 2104 02/11/23 0640  WBC 12.7*  --  8.7  HGB 11.8*  --  12.2*  HCT 34.3*  --  35.4*  PLT 274  --  281  NA 129*  --  129*  K 3.2*  --  3.9  CL 88*  --  93*  CO2 29  --  24  BUN 19  --  20  CREATININE 0.78  --  0.75  GLUCOSE 167*  --  108*  GLUCAP  --  139*  --   CALCIUM 9.1  --  9.3    Examination: Neurologically intact} Pt has sever vascular changes in bilateral lower extremities.  His knee in slightly warm to the touch.  No cellulitis.  He does have a mild to moderate effusion.  Pt has moderate irritability with movement of right knee. Assessment:   Septic right total knee    ADDITIONAL DIAGNOSIS:  Hypertension, Sleep Apnea, and PVD, A Fib,  Plan:  Pt has a very high suspicion for infected right total knee arthoplasty.  We are awaiting cultures from aspirate in office.  Continue with imperical IV ABX per infectious disease.  Plan for surgery on Tuesday with hardware removal and placement of abx spacer.  Will need to hold eliquis at least 3 days prior to surgery.  We appreciate  hospitalist's continued input and care.     Dannielle Burn 02/11/2023, 9:06 AM

## 2023-02-11 NOTE — Consult Note (Signed)
[facility]    Date of Admission:  02/10/2023     Reason for Consult: Prosthetic joint infection and bacteremia     Referring Physician: Dr. Lowell Guitar  Current antibiotics: Cefepime Vancomycin    ASSESSMENT:    57 y.o. 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  No prior history of prosthetic joint infection per patient.  Arthrocentesis results from the clinic unavailable, but reported as purulent and cultures pending.  Also found to have gram-negative rod bacteremia with further speciation still pending from his blood cultures.  Tentative plan appears to be hardware removal and placement of antibiotic spacer on 02/14/2023 with orthopedic surgery. Gram-negative rod bacteremia: Noted on admission blood cultures and suspected secondary to #1. No targets detected on BCID. Peripheral vascular disease.  Atrial fibrillation.  RECOMMENDATIONS:    Continue cefepime pending blood culture ID and cultures from the orthopedic office Stop vancomycin Plan for OR on 6/4 ID following   Principal Problem:   Septic arthritis of knee (HCC) Active Problems:   Essential (primary) hypertension   Paroxysmal atrial fibrillation (HCC)   Obesity   Obstructive sleep apnea   MEDICATIONS:    Scheduled Meds:  acetaminophen  1,000 mg Oral Q8H   cloNIDine  0.2 mg Oral TID   furosemide  80 mg Oral Daily   ketorolac  15 mg Intravenous Q6H   labetalol  300 mg Oral BID   oxyCODONE  20 mg Oral Q4H   pantoprazole  40 mg Oral Daily   Continuous Infusions:  sodium chloride 100 mL/hr at 02/10/23 1632   ceFEPime (MAXIPIME) IV     vancomycin 1,250 mg (02/11/23 0340)   PRN Meds:.acetaminophen **FOLLOWED BY** [START ON 02/14/2023] acetaminophen, albuterol, hydrALAZINE, ondansetron **OR** ondansetron (ZOFRAN) IV,  oxyCODONE **OR** oxyCODONE  HPI:    Peter Thompson is a 57 y.o. male with past medical history as noted below who presented to the emergency department yesterday from the orthopedics office.  Patient has a history of right total knee replacement in January 2020.  He presented to the orthopedics office yesterday with sudden onset right knee pain x 2 days.  This was associated with swelling.  He underwent arthrocentesis.  The synovial fluid is not available for review, however, the notes indicate the fluid looked infectious/purulent.  He was subsequently referred to the emergency department.  He denies any fevers, chills, sweats, respiratory complaints, GI complaints.  There has been no recent trauma to his knee.  He was started on vancomycin and ceftriaxone.  He was afebrile.  Lab work showed mild leukocytosis of 12.7, hyperglycemia, normal creatinine, negative HIV fourth-generation.  Blood culture's were obtained which are positive in 1 out of 4 bottles for gram-negative rods.  No specific targets were detected on BC ID.  His antibiotics were adjusted to cefepime and vancomycin was continued.  Orthopedic surgery has evaluated the patient again this morning.  They have a very high suspicion for infection of his right TKA and are waiting from cultures from the aspirate completed in the office.  They are planning for surgery on Tuesday, 02/14/2023 for hardware removal and placement of antibiotic spacer.  His Eliquis is currently on hold prior to surgery.  We had been consulted today for further antibiotic guidance.   Past Medical History:  Diagnosis Date   Abnormal results of  liver function studies 01/21/2021   Anemia 10/17/2018   Anxiety    Arthritis    Bursitis    Cervicogenic headache 10/06/2020   Complication of anesthesia    "woke up in OR- IV came out."   Concussion    several- head injury- was in [facility]   DJD (degenerative joint disease)    Essential hypertension 10/17/2018   History of  arthroscopic procedure on shoulder 01/21/2021   Hypertension    Insomnia    Left-sided epistaxis 01/30/2019   Low back pain 01/21/2021   Neck fracture (HCC)    Obesity 01/21/2021   Obstructive sleep apnea 09/04/2019   Osteoarthrosis, unspecified whether generalized or localized, other specified sites    Other and unspecified hyperlipidemia 01/21/2021   Other specified counseling 01/21/2021   Other, mixed, or unspecified nondependent drug abuse, unspecified 01/21/2021   Paroxysmal atrial fibrillation (HCC) 12/26/2018   Persistent atrial fibrillation (HCC)    Pre-op evaluation 12/05/2016   Primary localized osteoarthritis of left hip 05/10/2016   Primary osteoarthritis of left hip 05/10/2016   Primary osteoarthritis of right knee 10/02/2018   Psychosocial circumstance 01/21/2021   Smoking 12/05/2016   Tinnitus, bilateral     Social History   Tobacco Use   Smoking status: Former    Packs/day: 0.50    Years: 56.00    Additional pack years: 0.00    Total pack years: 28.00    Types: Cigarettes    Quit date: 12/25/2020    Years since quitting: 2.1   Smokeless tobacco: Former    Types: Chew   Tobacco comments:    smokes less than 0.5 ppd now  Vaping Use   Vaping Use: Never used  Substance Use Topics   Alcohol use: Yes    Alcohol/week: 12.0 standard drinks of alcohol    Types: 12 Cans of beer per week   Drug use: No    Family History  Problem Relation Age of Onset   Cancer Other    Heart disease Neg Hx    Headache Neg Hx    Migraines Neg Hx     No Known Allergies  Review of Systems  All other systems reviewed and are negative. Except as otherwise noted in the HPI.  OBJECTIVE:   Blood pressure 120/70, pulse 84, temperature 98.3 F (36.8 C), temperature source Oral, resp. rate 20, height 5\' 10"  (1.778 m), weight 113.4 kg, SpO2 97 %. Body mass index is 35.87 kg/m.  Physical Exam Constitutional:      General: He is not in acute distress.    Appearance: Normal appearance.   HENT:     Head: Normocephalic and atraumatic.  Eyes:     Extraocular Movements: Extraocular movements intact.     Conjunctiva/sclera: Conjunctivae normal.  Cardiovascular:     Rate and Rhythm: Normal rate and regular rhythm.  Pulmonary:     Effort: Pulmonary effort is normal. No respiratory distress.  Abdominal:     General: There is no distension.     Palpations: Abdomen is soft.  Musculoskeletal:        General: Swelling present.     Cervical back: Normal range of motion and neck supple.     Right lower leg: Edema present.     Left lower leg: Edema present.     Comments: Right knee incision healing with tenderness and swelling around the knee area. Bilateral LE wounds with evidence of stasis dermatitis and PVD.   Skin:    Findings: No rash.  Neurological:  General: No focal deficit present.     Mental Status: He is alert and oriented to person, place, and time.  Psychiatric:        Mood and Affect: Mood normal.        Behavior: Behavior normal.      Lab Results: Lab Results  Component Value Date   WBC 8.7 02/11/2023   HGB 12.2 (L) 02/11/2023   HCT 35.4 (L) 02/11/2023   MCV 87.2 02/11/2023   PLT 281 02/11/2023    Lab Results  Component Value Date   NA 129 (L) 02/11/2023   K 3.9 02/11/2023   CO2 24 02/11/2023   GLUCOSE 108 (H) 02/11/2023   BUN 20 02/11/2023   CREATININE 0.75 02/11/2023   CALCIUM 9.3 02/11/2023   GFRNONAA >60 02/11/2023   GFRAA >60 10/10/2018    Lab Results  Component Value Date   ALT 25 12/22/2016   AST 29 12/22/2016   ALKPHOS 74 12/22/2016   BILITOT 0.5 12/22/2016    No results found for: "CRP"  No results found for: "ESRSEDRATE"  I have reviewed the micro and lab results in Epic.  Imaging: No results found.   Imaging independently reviewed in Epic.  Vedia Coffer for Infectious Disease [facility] [phone] pager 02/11/2023, 9:57 AM

## 2023-02-11 NOTE — Progress Notes (Signed)
   02/11/23 0212  BiPAP/CPAP/SIPAP  $ Non-Invasive Home Ventilator  Initial  $ Face Mask Large  Yes  BiPAP/CPAP/SIPAP Pt Type Adult  BiPAP/CPAP/SIPAP Resmed  Mask Type Full face mask  Mask Size Large  Respiratory Rate 15 breaths/min  EPAP 16 cmH2O (per Pt stated home setting)  Patient Home Equipment No  Auto Titrate No

## 2023-02-11 NOTE — Progress Notes (Signed)
   02/11/23 2309  BiPAP/CPAP/SIPAP  BiPAP/CPAP/SIPAP Pt Type Adult  BiPAP/CPAP/SIPAP Resmed  Mask Type Full face mask  Mask Size Large  Set Rate 0 breaths/min  Respiratory Rate 16 breaths/min  EPAP 16 cmH2O  Patient Home Equipment No  Auto Titrate No   PT to place cpap on self when ready.

## 2023-02-11 NOTE — Progress Notes (Signed)
**Note De-Identified vi Obfusction** PROGRESS NOTE    Peter Thompson  ZOX:[id number] DOB: Jnury 01, 1968 DOA: 02/10/2023 PCP: Quitmn Livings, MD  Chief Complint  [name] presents with   Knee Pin    Brief Nrrtive:    Peter Thompson is  57 y.o. 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  According to ptient, he suddenly strted right knee pin 2 dys go nd then noticed some swelling.  Went to see orthopedics Dr. Jerl Sntos in the office, underwent rthrocentesis of the right knee.  They thought tht the fluid looked infectious/purulent so they sent him to the ED.  [name] denies ny fever, chills, sweting, chest pin, shortness of breth, ny trum to the knee or ny other complint.  He hs been on opioids, oxycodone 20 mg p.o. every 4 hours s needed for lmost 38 yers, when he hd neck surgery done since he ws in the [employer].   ED Course: Upon rrivl to ED, he ws hemodynmiclly stble.  Lbs showed mild hypontremi nd mild hypoklemi.  Mild leukocytosis.  He ws given Rocephin nd vncomycin nd hospitlist were consulted for dmission.  Per EDP, orthopedics is wre of his hospitliztion nd they pln to do wshout of the knee t some point in time.   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  Diffuse therosclerosis on L, no stenosis.  Lst seen by Dr. Bing Mtter 04/2022, t tht time ws on plvix -> he tells me he ws told to discontinue this nd continue eliquis There's  telephone note from 10/2022 which notes he ws supposed to be tking both plvix nd eliquis  There's  not from 4/25 with crdiology tht mentions continuing current regimen? Will clrify with vsculr recommendtions post op    Essentil hypertension: [name] ppers to be on severl medictions including mlodipine, clonidine, hydrlzine, lbetlol, lisinopril nd hydrochlorothizide.  BP's ok right now on only clonidine nd lbetlol Will continue mlodipine, clonidine, lbetlol  Hold lisinopril, HCTZ, hydrlzine for now since BP resonbly controlled    Hyperlipidemi: Resume Zeti    Obstructive sleep pne: Unsure whether ptient uses CPAP or not.  [name] slightly groggy during my evlution.   Hypoklemi: Replenish.    Chronic hypontremi: Upon chrt review, it sounds like he hs intermittent chronic hypontremi.  He is symptomtic currently.   Mild, follow     DVT prophylxis: SCD Code Sttus: full Fmily Communiction: none Disposition:   Sttus is: Inptient Remins inptient pproprite becuse: need for ortho intervention, ID input   Consultnts:  ID ortho  Procedures:  none  Antimicrobils:  Anti-infectives (From dmission, onwrd)    Strt  Dose/Rate Route Frequency Ordered Stop   02/11/23 1600  cefTRIAXone (ROCEPHIN) 2 g in sodium chloride 0.9 % 100 mL IVPB  Status:  Discontinued        2 g 200 mL/hr over 30 Minutes Intravenous Every 24 hours 02/10/23 1522 02/11/23 0917   02/11/23 1000  ceFEPIme  (MAXIPIME) 2 g in sodium chloride 0.9 % 100 mL IVPB        2 g 200 mL/hr over 30 Minutes Intravenous Every 8 hours 02/11/23 0917     02/11/23 0300  vancomycin (VANCOREADY) IVPB 1250 mg/250 mL        1,250 mg 166.7 mL/hr over 90 Minutes Intravenous Every 12 hours 02/10/23 1538     02/10/23 1530  cefTRIAXone (ROCEPHIN) 1 g in sodium chloride 0.9 % 100 mL IVPB        1 g 200 mL/hr over 30 Minutes Intravenous STAT 02/10/23 1518 02/10/23 1704   02/10/23 1300  vancomycin (VANCOREADY) IVPB 2000 mg/400 mL        2,000 mg 200 mL/hr over 120 Minutes Intravenous  Once 02/10/23 1258 02/10/23 1631   02/10/23 1245  cefTRIAXone (ROCEPHIN) 1 g in sodium chloride 0.9 % 100 mL IVPB        1 g 200 mL/hr over 30 Minutes Intravenous  Once 02/10/23 1234 02/10/23 1432       Subjective: C/o pain He's pretty worried about upcoming procedure  Objective: Vitals:   02/10/23 1612 02/10/23 1656 02/10/23 2020 02/11/23 0455  BP:  (!) 133/91 116/62 120/70  Pulse:  91 84 84  Resp:  20 20 20   Temp: 97.6 F (36.4 C) (!) 97.5 F (36.4 C) 97.6 F (36.4 C) 98.3 F (36.8 C)  TempSrc: Oral Oral Oral Oral  SpO2:  99% 99% 97%  Weight:      Height:        Intake/Output Summary (Last 24 hours) at 02/11/2023 1123 Last data filed at 02/11/2023 0500 Gross per 24 hour  Intake 1652.05 ml  Output --  Net 1652.05 ml   Filed Weights   02/10/23 1226  Weight: 113.4 kg    Examination:  General exam: sitting up on edge of bed Respiratory system: unlabored Cardiovascular system: RRR Central nervous system: Alert and oriented. No focal neurological deficits. Extremities: R knee wrapped, edema to RLE, palpable DP pulses bilaterally    Data Reviewed: I have personally reviewed following labs and imaging studies  CBC: Recent Labs  Lab 02/10/23 1325 02/11/23 0640  WBC 12.7* 8.7  HGB 11.8* 12.2*  HCT 34.3* 35.4*  MCV 86.6 87.2  PLT 274 281    Basic Metabolic Panel: Recent Labs  Lab 02/10/23 1325  02/10/23 1517 02/11/23 0640  NA 129*  --  129*  K 3.2*  --  3.9  CL 88*  --  93*  CO2 29  --  24  GLUCOSE 167*  --  108*  BUN 19  --  20  CREATININE 0.78  --  0.75  CALCIUM 9.1  --  9.3  MG  --  2.1  --     GFR: Estimated Creatinine Clearance: 128.5 mL/min (by C-G formula based on SCr of 0.75 mg/dL).  Liver Function Tests: No results for input(s): "AST", "ALT", "ALKPHOS", "BILITOT", "PROT", "ALBUMIN" in the last 168 hours.  CBG: Recent Labs  Lab 02/10/23 2104  GLUCAP 139*     Recent Results (from the past 240 hour(s))  Blood culture (routine x 2)     Status: None (Preliminary  result)   Collection Time: 02/10/23  1:25 PM   Specimen: BLOOD LEFT FORERM  Result Value Ref Range Status   Specimen Description   Final    BLOOD LEFT FORERM Performed at [facility], [address], [place], [place] [address]    Special Requests   Final    BOTTLES DRWN EROBIC ND NEROBIC Blood Culture results may not be optimal due to an excessive volume of blood received in culture bottles Performed at [facility], [address], [place], [place] [address]    Culture   Final    NO GROWTH < 24 HOURS Performed at [facility], [address], [place], [place] [address]    Report Status PENDING  Incomplete  Blood culture (routine x 2)     Status: None (Preliminary result)   Collection Time: 02/10/23  1:30 PM   Specimen: BLOOD RIGHT FORERM  Result Value Ref Range Status   Specimen Description   Final    BLOOD RIGHT FORERM Performed at [facility], [address] [place], [place] [address]    Special Requests   Final    BOTTLES DRWN EROBIC ND NEROBIC Blood Culture results may not be optimal due to an excessive volume of blood received in culture bottles Performed at [facility], [address] [place], [place] [address]    Culture  Setup Time   Final    GRM NEGTIVE RODS NEROBIC BOTTLE ONLY CRITICL RESULT  CLLED TO, RED BCK BY ND VERIFIED WITH: PHRMD J. GDHI 02/11/23 @ 0731 BY B Performed at [facility], [address] [place], [place] [address]    Culture GRM NEGTIVE RODS  Final   Report Status PENDING  Incomplete  Blood Culture ID Panel (Reflexed)     Status: None   Collection Time: 02/10/23  1:30 PM  Result Value Ref Range Status   Enterococcus faecalis NOT DETECTED NOT DETECTED Final   Enterococcus Faecium NOT DETECTED NOT DETECTED Final   Listeria monocytogenes NOT DETECTED NOT DETECTED Final   Staphylococcus species NOT DETECTED NOT DETECTED Final   Staphylococcus aureus (BCID) NOT DETECTED NOT DETECTED Final   Staphylococcus epidermidis NOT DETECTED NOT DETECTED Final   Staphylococcus lugdunensis NOT DETECTED NOT DETECTED Final   Streptococcus species NOT DETECTED NOT DETECTED Final   Streptococcus agalactiae NOT DETECTED NOT DETECTED Final   Streptococcus pneumoniae NOT DETECTED NOT DETECTED Final   Streptococcus pyogenes NOT DETECTED NOT DETECTED Final   .calcoaceticus-baumannii NOT DETECTED NOT DETECTED Final   Bacteroides fragilis NOT DETECTED NOT DETECTED Final   Enterobacterales NOT DETECTED NOT DETECTED Final   Enterobacter cloacae complex NOT DETECTED NOT DETECTED Final   Escherichia coli NOT DETECTED NOT DETECTED Final   Klebsiella aerogenes NOT DETECTED NOT DETECTED Final   Klebsiella oxytoca NOT DETECTED NOT DETECTED Final   Klebsiella pneumoniae NOT DETECTED NOT DETECTED Final   Proteus species NOT DETECTED NOT DETECTED Final   Salmonella species NOT DETECTED NOT DETECTED Final   Serratia marcescens NOT DETECTED NOT DETECTED Final   Haemophilus influenzae NOT DETECTED NOT DETECTED Final   Neisseria meningitidis NOT DETECTED NOT DETECTED Final   Pseudomonas aeruginosa NOT DETECTED NOT DETECTED Final   Stenotrophomonas maltophilia NOT DETECTED NOT DETECTED Final   Candida albicans NOT DETECTED NOT DETECTED Final   Candida auris NOT DETECTED NOT  DETECTED Final   Candida glabrata NOT DETECTED NOT DETECTED Final   Candida krusei NOT DETECTED NOT DETECTED  Final   Candida parapsilosis NOT DETECTED NOT DETECTED Final   Candida tropicalis NOT DETECTED NOT DETECTED Final   Cryptococcus neoformans/gattii NOT DETECTED NOT DETECTED Final    Comment: Performed at [facility], [address], [place], [place] [address]         Radiology Studies: No results found.      Scheduled Meds:  acetaminophen  1,000 mg Oral Q8H   cloNIDine  0.2 mg Oral TID   furosemide  80 mg Oral Daily   ketorolac  15 mg Intravenous Q6H   labetalol  300 mg Oral BID   oxyCODONE  20 mg Oral Q4H   pantoprazole  40 mg Oral Daily   Continuous Infusions:  sodium chloride 100 mL/hr at 02/10/23 1632   ceFEPime (MAXIPIME) IV 2 g (02/11/23 1058)   vancomycin 1,250 mg (02/11/23 0340)     LOS: 1 day    Time spent: over 30 min    Lacretia Nicks, MD [facility]   To contact the attending provider between 7A-7P or the covering provider during after hours 7P-7A, please log into the web site [email or web address] and access using universal [place] password for that web site. If you do not have the password, please call the hospital operator.  02/11/2023, 11:23 AM

## 2023-02-11 NOTE — Progress Notes (Signed)
PHARMACY - PHYSICIAN COMMUNICATION CRITICAL VALUE ALERT - BLOOD CULTURE IDENTIFICATION (BCID)  Peter Thompson is an 57 y.o. male who presented to [facility] on 02/10/2023 with a chief complaint of right knee pain.  Assessment: septic arthritis of knee. Blood cultures 1/4 bottles with gram negative rods, no organism detected on BCID.   Name of physician (or Provider) Contacted: Dr. Lowell Guitar  Current antibiotics: Vancomycin, Ceftriaxone  Changes to prescribed antibiotics recommended:  Change Ceftriaxone to Cefepime 2g IV q8h Continue Vancomycin for now per discussion with MD  Results for orders placed or performed during the hospital encounter of 02/10/23  Blood Culture ID Panel (Reflexed) (Collected: 02/10/2023  1:30 PM)  Result Value Ref Range   Enterococcus faecalis NOT DETECTED NOT DETECTED   Enterococcus Faecium NOT DETECTED NOT DETECTED   Listeria monocytogenes NOT DETECTED NOT DETECTED   Staphylococcus species NOT DETECTED NOT DETECTED   Staphylococcus aureus (BCID) NOT DETECTED NOT DETECTED   Staphylococcus epidermidis NOT DETECTED NOT DETECTED   Staphylococcus lugdunensis NOT DETECTED NOT DETECTED   Streptococcus species NOT DETECTED NOT DETECTED   Streptococcus agalactiae NOT DETECTED NOT DETECTED   Streptococcus pneumoniae NOT DETECTED NOT DETECTED   Streptococcus pyogenes NOT DETECTED NOT DETECTED   A.calcoaceticus-baumannii NOT DETECTED NOT DETECTED   Bacteroides fragilis NOT DETECTED NOT DETECTED   Enterobacterales NOT DETECTED NOT DETECTED   Enterobacter cloacae complex NOT DETECTED NOT DETECTED   Escherichia coli NOT DETECTED NOT DETECTED   Klebsiella aerogenes NOT DETECTED NOT DETECTED   Klebsiella oxytoca NOT DETECTED NOT DETECTED   Klebsiella pneumoniae NOT DETECTED NOT DETECTED   Proteus species NOT DETECTED NOT DETECTED   Salmonella species NOT DETECTED NOT DETECTED   Serratia marcescens NOT DETECTED NOT DETECTED   Haemophilus influenzae NOT DETECTED NOT  DETECTED   Neisseria meningitidis NOT DETECTED NOT DETECTED   Pseudomonas aeruginosa NOT DETECTED NOT DETECTED   Stenotrophomonas maltophilia NOT DETECTED NOT DETECTED   Candida albicans NOT DETECTED NOT DETECTED   Candida auris NOT DETECTED NOT DETECTED   Candida glabrata NOT DETECTED NOT DETECTED   Candida krusei NOT DETECTED NOT DETECTED   Candida parapsilosis NOT DETECTED NOT DETECTED   Candida tropicalis NOT DETECTED NOT DETECTED   Cryptococcus neoformans/gattii NOT DETECTED NOT DETECTED    Peter Thompson 02/11/2023  8:19 AM

## 2023-02-12 DIAGNOSIS — M009 Pyogenic arthritis, unspecified: Secondary | ICD-10-CM | POA: Diagnosis not present

## 2023-02-12 DIAGNOSIS — Z96659 Presence of unspecified artificial knee joint: Secondary | ICD-10-CM | POA: Diagnosis not present

## 2023-02-12 DIAGNOSIS — T8459XA Infection and inflammatory reaction due to other internal joint prosthesis, initial encounter: Secondary | ICD-10-CM | POA: Diagnosis not present

## 2023-02-12 LAB — CBC WITH DIFFERENTIAL/PLATELET
Abs Immature Granulocytes: 0.03 10*3/uL (ref 0.00–0.07)
Basophils Absolute: 0.1 10*3/uL (ref 0.0–0.1)
Basophils Relative: 1 %
Eosinophils Absolute: 0.2 10*3/uL (ref 0.0–0.5)
Eosinophils Relative: 3 %
HCT: 32.2 % — ABNORMAL LOW (ref 39.0–52.0)
Hemoglobin: 10.7 g/dL — ABNORMAL LOW (ref 13.0–17.0)
Immature Granulocytes: 0 %
Lymphocytes Relative: 14 %
Lymphs Abs: 1 10*3/uL (ref 0.7–4.0)
MCH: 29.5 pg (ref 26.0–34.0)
MCHC: 33.2 g/dL (ref 30.0–36.0)
MCV: 88.7 fL (ref 80.0–100.0)
Monocytes Absolute: 1 10*3/uL (ref 0.1–1.0)
Monocytes Relative: 15 %
Neutro Abs: 4.8 10*3/uL (ref 1.7–7.7)
Neutrophils Relative %: 67 %
Platelets: 288 10*3/uL (ref 150–400)
RBC: 3.63 MIL/uL — ABNORMAL LOW (ref 4.22–5.81)
RDW: 13.2 % (ref 11.5–15.5)
WBC: 7.1 10*3/uL (ref 4.0–10.5)
nRBC: 0 % (ref 0.0–0.2)

## 2023-02-12 LAB — COMPREHENSIVE METABOLIC PANEL
ALT: 43 U/L (ref 0–44)
AST: 36 U/L (ref 15–41)
Albumin: 3.6 g/dL (ref 3.5–5.0)
Alkaline Phosphatase: 98 U/L (ref 38–126)
Anion gap: 11 (ref 5–15)
BUN: 28 mg/dL — ABNORMAL HIGH (ref 6–20)
CO2: 22 mmol/L (ref 22–32)
Calcium: 8.8 mg/dL — ABNORMAL LOW (ref 8.9–10.3)
Chloride: 95 mmol/L — ABNORMAL LOW (ref 98–111)
Creatinine, Ser: 1.02 mg/dL (ref 0.61–1.24)
GFR, Estimated: >60 mL/min (ref >60)
Glucose, Bld: 129 mg/dL — ABNORMAL HIGH (ref 70–99)
Potassium: 3.8 mmol/L (ref 3.5–5.1)
Sodium: 128 mmol/L — ABNORMAL LOW (ref 135–145)
Total Bilirubin: 1.1 mg/dL (ref 0.3–1.2)
Total Protein: 6.9 g/dL (ref 6.5–8.1)

## 2023-02-12 LAB — NA AND K (SODIUM & POTASSIUM), RAND UR
Potassium Urine: 13 mmol/L
Sodium, Ur: 46 mmol/L

## 2023-02-12 LAB — MAGNESIUM: Magnesium: 1.8 mg/dL (ref 1.7–2.4)

## 2023-02-12 LAB — OSMOLALITY, URINE: Osmolality, Ur: 368 mOsm/kg (ref 300–900)

## 2023-02-12 LAB — BRAIN NATRIURETIC PEPTIDE: B Natriuretic Peptide: 275.9 pg/mL — ABNORMAL HIGH (ref 0.0–100.0)

## 2023-02-12 LAB — FERRITIN: Ferritin: 156 ng/mL (ref 24–336)

## 2023-02-12 LAB — PHOSPHORUS: Phosphorus: 4.7 mg/dL — ABNORMAL HIGH (ref 2.5–4.6)

## 2023-02-12 LAB — CULTURE, BLOOD (ROUTINE X 2)

## 2023-02-12 LAB — OSMOLALITY: Osmolality: 273 mOsm/kg — ABNORMAL LOW (ref 275–295)

## 2023-02-12 LAB — C-REACTIVE PROTEIN: CRP: 10.6 mg/dL — ABNORMAL HIGH (ref <1.0)

## 2023-02-12 MED ORDER — ALPRAZOLAM 0.5 MG PO TABS
1.0000 mg | ORAL_TABLET | Freq: Every evening | ORAL | Status: DC | PRN
  Administered 2023-02-13 – 2023-02-14 (×2): 1 mg via ORAL
  Filled 2023-02-12 (×2): qty 2

## 2023-02-12 MED ORDER — POLYETHYLENE GLYCOL 3350 17 G PO PACK
17.0000 g | PACK | Freq: Two times a day (BID) | ORAL | Status: DC
  Administered 2023-02-12 – 2023-02-15 (×6): 17 g via ORAL
  Filled 2023-02-12 (×6): qty 1

## 2023-02-12 MED ORDER — SODIUM CHLORIDE 0.9 % IV SOLN
3.0000 g | Freq: Four times a day (QID) | INTRAVENOUS | Status: DC
  Administered 2023-02-12 – 2023-02-15 (×11): 3 g via INTRAVENOUS
  Filled 2023-02-12 (×14): qty 8

## 2023-02-12 MED ORDER — ALPRAZOLAM 0.5 MG PO TABS
0.5000 mg | ORAL_TABLET | Freq: Once | ORAL | Status: AC
  Administered 2023-02-12: 0.5 mg via ORAL
  Filled 2023-02-12: qty 1

## 2023-02-12 NOTE — Progress Notes (Addendum)
**Note De-Identified vi Obfusction** PROGRESS NOTE    Peter Thompson  ZOX:[id number] DOB: 02-07-1966 DOA: 02/10/2023 PCP: Quitmn Livings, MD  Chief Complint  [name] presents with   Knee Pin    Brief Nrrtive:    Peter Thompson is  58 y.o. 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  According to ptient, he suddenly strted right knee pin 2 dys go nd then noticed some swelling.  Went to see orthopedics Dr. Jerl Sntos in the office, underwent rthrocentesis of the right knee.  They thought tht the fluid looked infectious/purulent so they sent him to the ED.  [name] denies ny fever, chills, sweting, chest pin, shortness of breth, ny trum to the knee or ny other complint.  He hs been on opioids, oxycodone 20 mg p.o. every 4 hours s needed for lmost 38 yers, when he hd neck surgery done since he ws in the [employer].   ED Course: Upon rrivl to ED, he ws hemodynmiclly stble.  Lbs showed mild hypontremi nd mild hypoklemi.  Mild leukocytosis.  He ws given Rocephin nd vncomycin nd hospitlist were consulted for dmission.  Per EDP, orthopedics is wre of his hospitliztion nd they pln to do wshout of the knee t some point in time.   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  Diffuse therosclerosis on L, no stenosis.  Lst seen by Dr. Bing Mtter 04/2022, t tht time ws on plvix -> he tells me he ws told to discontinue this nd continue eliquis There's  telephone note from 10/2022 which notes he ws supposed to be tking both plvix nd eliquis  There's  not from 4/25 with crdiology tht mentions continuing current regimen? Will clrify with vsculr recommendtions post op    Essentil hypertension: [name] ppers to be on severl medictions including mlodipine, clonidine, hydrlzine, lbetlol, lisinopril nd hydrochlorothizide.  BP's ok right now on only clonidine nd lbetlol Will continue mlodipine, clonidine, lbetlol, lsix  Hold lisinopril, HCTZ, hydrlzine for now since BP resonbly controlled    Hyperlipidemi: Resume Zeti    Obstructive sleep pne: CPAP    Hypoklemi: Replenish.    Chronic hypontremi: Upon chrt review, it sounds like he hs intermittent chronic hypontremi.  He is symptomtic currently.   Some mild LE edem, he's on lsix, will dd fluid restriction nd follow     DVT prophylxis: SCD Code Sttus: full Fmily Communiction: none Disposition:   Sttus is: Inptient Remins inptient pproprite becuse: need for ortho intervention, ID input   Consultnts:  ID  ortho  Procedures:  none  Antimicrobials:  Anti-infectives (From admission, onward)    Start     Dose/Rate Route Frequency Ordered Stop   02/11/23 1600  cefTRIAXone (ROCEPHIN) 2 g in sodium chloride 0.9 % 100 mL IVPB  Status:  Discontinued        2 g 200 mL/hr over 30  Minutes Intravenous Every 24 hours 02/10/23 1522 02/11/23 0917   02/11/23 1000  ceFEPIme (MAXIPIME) 2 g in sodium chloride 0.9 % 100 mL IVPB        2 g 200 mL/hr over 30 Minutes Intravenous Every 8 hours 02/11/23 0917     02/11/23 0300  vancomycin (VANCOREADY) IVPB 1250 mg/250 mL  Status:  Discontinued        1,250 mg 166.7 mL/hr over 90 Minutes Intravenous Every 12 hours 02/10/23 1538 02/11/23 1514   02/10/23 1530  cefTRIAXone (ROCEPHIN) 1 g in sodium chloride 0.9 % 100 mL IVPB        1 g 200 mL/hr over 30 Minutes Intravenous STAT 02/10/23 1518 02/10/23 1704   02/10/23 1300  vancomycin (VANCOREADY) IVPB 2000 mg/400 mL        2,000 mg 200 mL/hr over 120 Minutes Intravenous  Once 02/10/23 1258 02/10/23 1631   02/10/23 1245  cefTRIAXone (ROCEPHIN) 1 g in sodium chloride 0.9 % 100 mL IVPB        1 g 200 mL/hr over 30 Minutes Intravenous  Once 02/10/23 1234 02/10/23 1432       Subjective: Asking for bowel regimen   Objective: Vitals:   02/11/23 0455 02/11/23 1355 02/11/23 2016 02/12/23 0529  BP: 120/70 126/71 121/65 (!) 140/77  Pulse: 84 84 83 72  Resp: 20 17 18 18   Temp: 98.3 F (36.8 C) 98 F (36.7 C) 98.1 F (36.7 C) 98.6 F (37 C)  TempSrc: Oral Oral Oral Oral  SpO2: 97% 100% 99% 99%  Weight:    109.7 kg  Height:        Intake/Output Summary (Last 24 hours) at 02/12/2023 1239 Last data filed at 02/12/2023 0745 Gross per 24 hour  Intake 800 ml  Output 400 ml  Net 400 ml   Filed Weights   02/10/23 1226 02/12/23 0529  Weight: 113.4 kg 109.7 kg    Examination:  General: No acute distress. Cardiovascular: RRR Lungs: unlabored Abdomen: Soft, nontender, nondistended Neurological: Alert and oriented 3. Moves all extremities 4 with equal strength. Cranial nerves II through XII grossly intact. Extremities: scar to R knee, swelling - bilateral LE's with venous stasis changes, mild edema     Data Reviewed: I have personally reviewed following labs and imaging  studies  CBC: Recent Labs  Lab 02/10/23 1325 02/11/23 0640 02/12/23 0523  WBC 12.7* 8.7 7.1  NEUTROABS  --   --  4.8  HGB 11.8* 12.2* 10.7*  HCT 34.3* 35.4* 32.2*  MCV 86.6 87.2 88.7  PLT 274 281 288    Basic Metabolic Panel: Recent Labs  Lab 02/10/23 1325 02/10/23 1517 02/11/23 0640 02/12/23 0523  NA 129*  --  129* 128*  K 3.2*  --  3.9 3.8  CL 88*  --  93* 95*  CO2 29  --  24 22  GLUCOSE 167*  --  108* 129*  BUN 19  --  20 28*  CREATININE 0.78  --  0.75 1.02  CALCIUM 9.1  --  9.3 8.8*  MG  --  2.1  --  1.8  PHOS  --   --   --  4.7*    GFR: Estimated Creatinine Clearance: 99.1 mL/min (by C-G formula based on SCr of 1.02 mg/dL).  Liver Function Tests: Recent Labs  Lab 02/12/23 0523  ST 36  LT 43  LKPHOS 98  BILITOT 1.1  PROT 6.9  LBUMIN 3.6    CBG: Recent Labs  Lab 02/10/23 2104  GLUCP 139*     Recent Results (from the past 240 hour(s))  Blood culture (routine x 2)     Status: None (Preliminary result)   Collection Time: 02/10/23  1:25 PM   Specimen: BLOOD LEFT FORERM  Result Value Ref Range Status   Specimen Description   Final    BLOOD LEFT FORERM Performed at [facility], [address] [place], [place] [address]    Special Requests   Final    BOTTLES DRWN EROBIC ND NEROBIC Blood Culture results may not be optimal due to an excessive volume of blood received in culture bottles Performed at [facility], [address] [place], [place] [address]    Culture   Final    NO GROWTH 2 DYS Performed at [facility], [address], [place], [place] [address]    Report Status PENDING  Incomplete  Blood culture (routine x 2)     Status: None (Preliminary result)   Collection Time: 02/10/23  1:30 PM   Specimen: BLOOD RIGHT FORERM  Result Value Ref Range Status   Specimen Description   Final    BLOOD RIGHT FORERM Performed at [facility], [address], [place], [place] [address]     Special Requests   Final    BOTTLES DRWN EROBIC ND NEROBIC Blood Culture results may not be optimal due to an excessive volume of blood received in culture bottles Performed at [facility], [address], [place], [place] [address]    Culture  Setup Time   Final    GRM NEGTIVE RODS NEROBIC BOTTLE ONLY CRITICL RESULT CLLED TO, RED BCK BY ND VERIFIED WITH: PHRMD J. GDHI 02/11/23 @ 0731 BY B    Culture   Final    GRM NEGTIVE RODS IDENTIFICTION ND SUSCEPTIBILITIES TO FOLLOW Performed at [facility], [address], [place], [place] [address]    Report Status PENDING  Incomplete  Blood Culture ID Panel (Reflexed)     Status: None   Collection Time: 02/10/23  1:30 PM  Result Value Ref Range Status   Enterococcus faecalis NOT DETECTED NOT DETECTED Final   Enterococcus Faecium NOT DETECTED NOT DETECTED Final   Listeria monocytogenes NOT DETECTED NOT DETECTED Final   Staphylococcus species NOT DETECTED NOT DETECTED Final   Staphylococcus aureus (BCID) NOT DETECTED NOT DETECTED Final   Staphylococcus epidermidis NOT DETECTED NOT DETECTED Final   Staphylococcus lugdunensis NOT DETECTED NOT DETECTED Final   Streptococcus species NOT DETECTED NOT DETECTED Final   Streptococcus agalactiae NOT DETECTED NOT DETECTED Final   Streptococcus pneumoniae NOT DETECTED NOT DETECTED Final   Streptococcus pyogenes NOT DETECTED NOT DETECTED Final   .calcoaceticus-baumannii NOT DETECTED NOT DETECTED Final   Bacteroides fragilis NOT DETECTED NOT DETECTED Final   Enterobacterales NOT DETECTED NOT DETECTED Final   Enterobacter cloacae complex NOT DETECTED NOT DETECTED Final   Escherichia coli NOT DETECTED NOT DETECTED Final   Klebsiella aerogenes NOT DETECTED NOT DETECTED Final   Klebsiella oxytoca NOT DETECTED NOT DETECTED Final   Klebsiella pneumoniae NOT DETECTED NOT DETECTED Final   Proteus species NOT DETECTED  NOT DETECTED Final   Salmonella species NOT  DETECTED NOT DETECTED Final   Serratia marcescens NOT DETECTED NOT DETECTED Final   Haemophilus influenzae NOT DETECTED NOT DETECTED Final   Neisseria meningitidis NOT DETECTED NOT DETECTED Final   Pseudomonas aeruginosa NOT DETECTED NOT DETECTED Final   Stenotrophomonas maltophilia NOT DETECTED NOT DETECTED Final   Candida albicans NOT DETECTED NOT DETECTED Final   Candida auris NOT DETECTED NOT DETECTED Final   Candida glabrata NOT DETECTED NOT DETECTED Final   Candida krusei NOT DETECTED NOT DETECTED Final   Candida parapsilosis NOT DETECTED NOT DETECTED Final   Candida tropicalis NOT DETECTED NOT DETECTED Final   Cryptococcus neoformans/gattii NOT DETECTED NOT DETECTED Final    Comment: Performed at [facility], [address] [place], [place] [address]         Radiology Studies: No results found.      Scheduled Meds:  acetaminophen  1,000 mg Oral Q8H   amLODipine  10 mg Oral Daily   cloNIDine  0.2 mg Oral TID   furosemide  80 mg Oral Daily   ketorolac  15 mg Intravenous Q6H   labetalol  300 mg Oral BID   oxyCODONE  20 mg Oral Q4H   pantoprazole  40 mg Oral Daily   polyethylene glycol  17 g Oral BID   Continuous Infusions:  ceFEPime (MAXIPIME) IV 2 g (02/12/23 0934)     LOS: 2 days    Time spent: over 30 min    Lacretia Nicks, MD [facility]   To contact the attending provider between 7A-7P or the covering provider during after hours 7P-7A, please log into the web site [email or web address] and access using universal [place] password for that web site. If you do not have the password, please call the hospital operator.  02/12/2023, 12:39 PM

## 2023-02-12 NOTE — Plan of Care (Signed)

## 2023-02-12 NOTE — Progress Notes (Signed)
PATIENT ID: Peter Thompson  MRN: [id number]  DOB/AGE:  14-Sep-1965 / 57 y.o.        PROGRESS NOTE Subjective: Patient is alert, oriented, no Nausea, no Vomiting, yes passing gas. Taking PO well. Denies SOB, Chest or Calf Pain.   Ambulate WBAT, Patient reports pain as 7/10 .    Objective: Vital signs in last 24 hours: Vitals:   02/11/23 0455 02/11/23 1355 02/11/23 2016 02/12/23 0529  BP: 120/70 126/71 121/65 (!) 140/77  Pulse: 84 84 83 72  Resp: 20 17 18 18   Temp: 98.3 F (36.8 C) 98 F (36.7 C) 98.1 F (36.7 C) 98.6 F (37 C)  TempSrc: Oral Oral Oral Oral  SpO2: 97% 100% 99% 99%  Weight:    109.7 kg  Height:          Intake/Output from previous day: I/O last 3 completed shifts: In: 1783.3 [P.O.:480; I.V.:853.3; IV Piggyback:450] Out: 400 [Urine:400]   Intake/Output this shift: Total I/O In: 360 [P.O.:360] Out: -    LABORATORY DATA: Recent Labs    02/10/23 2104 02/11/23 0640 02/12/23 0523  WBC  --  8.7 7.1  HGB  --  12.2* 10.7*  HCT  --  35.4* 32.2*  PLT  --  281 288  NA  --  129* 128*  K  --  3.9 3.8  CL  --  93* 95*  CO2  --  24 22  BUN  --  20 28*  CREATININE  --  0.75 1.02  GLUCOSE  --  108* 129*  GLUCAP 139*  --   --   CALCIUM  --  9.3 8.8*    Examination: Neurovascular intact Dorsiflexion/Plantar flexion intact No cellulitis.  Knee has cooled down and more mobile today, improved ROM  decreased swelling }  Assessment:   Septic right total knee   ADDITIONAL DIAGNOSIS: Hypertension, Sleep Apnea, and PVD, A Fib,   Plan: Pt has a very high suspicion for infected right total knee arthoplasty. We are awaiting cultures from aspirate in office. Continue with imperical IV ABX per infectious disease. Plan for surgery on Tuesday with hardware removal and placement of abx spacer. Will need to hold eliquis at least 3 days prior to surgery. We appreciate hospitalist's continued input and care.      Dannielle Burn 02/12/2023, 8:37 AM

## 2023-02-12 NOTE — Progress Notes (Signed)
   02/12/23 2312  BiPAP/CPAP/SIPAP  BiPAP/CPAP/SIPAP Pt Type Adult  BiPAP/CPAP/SIPAP Resmed  Mask Type Full face mask  Mask Size Large  Set Rate 0 breaths/min  Respiratory Rate 16 breaths/min  EPAP 16 cmH2O  Patient Home Equipment No  Auto Titrate No   PT to place cpap on self when ready.

## 2023-02-13 DIAGNOSIS — M00861 Arthritis due to other bacteria, right knee: Secondary | ICD-10-CM | POA: Diagnosis not present

## 2023-02-13 DIAGNOSIS — T8453XA Infection and inflammatory reaction due to internal right knee prosthesis, initial encounter: Secondary | ICD-10-CM | POA: Diagnosis not present

## 2023-02-13 DIAGNOSIS — Z96659 Presence of unspecified artificial knee joint: Secondary | ICD-10-CM | POA: Diagnosis not present

## 2023-02-13 DIAGNOSIS — T8459XA Infection and inflammatory reaction due to other internal joint prosthesis, initial encounter: Secondary | ICD-10-CM | POA: Diagnosis not present

## 2023-02-13 LAB — COMPREHENSIVE METABOLIC PANEL
ALT: 42 U/L (ref 0–44)
AST: 33 U/L (ref 15–41)
Albumin: 3.8 g/dL (ref 3.5–5.0)
Alkaline Phosphatase: 103 U/L (ref 38–126)
Anion gap: 10 (ref 5–15)
BUN: 29 mg/dL — ABNORMAL HIGH (ref 6–20)
CO2: 24 mmol/L (ref 22–32)
Calcium: 8.7 mg/dL — ABNORMAL LOW (ref 8.9–10.3)
Chloride: 95 mmol/L — ABNORMAL LOW (ref 98–111)
Creatinine, Ser: 0.91 mg/dL (ref 0.61–1.24)
GFR, Estimated: >60 mL/min (ref >60)
Glucose, Bld: 123 mg/dL — ABNORMAL HIGH (ref 70–99)
Potassium: 3.6 mmol/L (ref 3.5–5.1)
Sodium: 129 mmol/L — ABNORMAL LOW (ref 135–145)
Total Bilirubin: 1 mg/dL (ref 0.3–1.2)
Total Protein: 7.2 g/dL (ref 6.5–8.1)

## 2023-02-13 LAB — CBC WITH DIFFERENTIAL/PLATELET
Abs Immature Granulocytes: 0.03 10*3/uL (ref 0.00–0.07)
Basophils Absolute: 0 10*3/uL (ref 0.0–0.1)
Basophils Relative: 0 %
Eosinophils Absolute: 0.3 10*3/uL (ref 0.0–0.5)
Eosinophils Relative: 4 %
HCT: 32.4 % — ABNORMAL LOW (ref 39.0–52.0)
Hemoglobin: 11.2 g/dL — ABNORMAL LOW (ref 13.0–17.0)
Immature Granulocytes: 0 %
Lymphocytes Relative: 14 %
Lymphs Abs: 1 10*3/uL (ref 0.7–4.0)
MCH: 30.5 pg (ref 26.0–34.0)
MCHC: 34.6 g/dL (ref 30.0–36.0)
MCV: 88.3 fL (ref 80.0–100.0)
Monocytes Absolute: 1.2 10*3/uL — ABNORMAL HIGH (ref 0.1–1.0)
Monocytes Relative: 18 %
Neutro Abs: 4.4 10*3/uL (ref 1.7–7.7)
Neutrophils Relative %: 64 %
Platelets: 292 10*3/uL (ref 150–400)
RBC: 3.67 MIL/uL — ABNORMAL LOW (ref 4.22–5.81)
RDW: 13.3 % (ref 11.5–15.5)
WBC: 6.9 10*3/uL (ref 4.0–10.5)
nRBC: 0 % (ref 0.0–0.2)

## 2023-02-13 LAB — MAGNESIUM: Magnesium: 1.9 mg/dL (ref 1.7–2.4)

## 2023-02-13 LAB — TSH: TSH: 1.026 u[IU]/mL (ref 0.350–4.500)

## 2023-02-13 LAB — CULTURE, BLOOD (ROUTINE X 2): Culture: NO GROWTH

## 2023-02-13 LAB — SURGICAL PCR SCREEN
MRSA, PCR: NEGATIVE
Staphylococcus aureus: NEGATIVE

## 2023-02-13 LAB — PHOSPHORUS: Phosphorus: 5 mg/dL — ABNORMAL HIGH (ref 2.5–4.6)

## 2023-02-13 LAB — CORTISOL: Cortisol, Plasma: 8 ug/dL

## 2023-02-13 MED ORDER — ADULT MULTIVITAMIN W/MINERALS CH
1.0000 | ORAL_TABLET | Freq: Every day | ORAL | Status: DC
  Administered 2023-02-13 – 2023-02-15 (×2): 1 via ORAL
  Filled 2023-02-13 (×2): qty 1

## 2023-02-13 MED ORDER — MAGNESIUM HYDROXIDE 400 MG/5ML PO SUSP
5.0000 mL | Freq: Once | ORAL | Status: DC
  Filled 2023-02-13: qty 30

## 2023-02-13 MED ORDER — MAGNESIUM HYDROXIDE 400 MG/5ML PO SUSP
5.0000 mL | Freq: Once | ORAL | Status: AC
  Administered 2023-02-13: 5 mL via ORAL

## 2023-02-13 NOTE — Progress Notes (Signed)
Initial Nutrition Assessment  INTERVENTION:   -Ensure Surgery PO BID, each provides 330 kcals and 18g protein following 6/4 procedure.  -Multivitamin with minerals daily  NUTRITION DIAGNOSIS:   Increased nutrient needs related to wound healing as evidenced by estimated needs.  GOAL:   Patient will meet greater than or equal to 90% of their needs  MONITOR:   PO intake, Supplement acceptance, Labs, Weight trends, I & O's  REASON FOR ASSESSMENT:   Malnutrition Screening Tool    ASSESSMENT:   57 y.o. male with medical history significant of paroxysmal atrial fibrillation on anticoagulation, hypertension, hyperlipidemia, obesity, chronic neck pain on opioids sent to ED from orthopedics office. Admitted with prosthetic knee joint infection of right knee.  Patient not in room at time of visit.  Noted meal tray in room. Pt on heart healthy diet today. Has been consistently consuming 100% of meals this admission. Will be NPO tomorrow for planned right total knee revision and I&D.  Recommend Ensure Surgery following procedure to aid in post-op healing.   Per weight records, pt has lost 28 lbs since February 2023, insignificant for time frame.  Medications: Lasix, Miralax  Labs reviewed:   CBGs: 139 Low Na Elevated Phos  NUTRITION - FOCUSED PHYSICAL EXAM:  Unable to complete, not in room.  Diet Order:   Diet Order             Diet NPO time specified  Diet effective midnight           Diet Heart Room service appropriate? Yes; Fluid consistency: Thin  Diet effective now                   EDUCATION NEEDS:   No education needs have been identified at this time  Skin:  Skin Assessment: Reviewed RN Assessment  Last BM:  6/2  Height:   Ht Readings from Last 1 Encounters:  02/10/23 5\' 10"  (1.778 m)    Weight:   Wt Readings from Last 1 Encounters:  02/12/23 109.7 kg    BMI:  Body mass index is 34.7 kg/m.  Estimated Nutritional Needs:   Kcal:   2100-2300  Protein:  105-115g  Fluid:  2.1L/day  Tilda Franco, MS, RD, LDN Inpatient Clinical Dietitian Contact information available via Amion

## 2023-02-13 NOTE — Progress Notes (Signed)
Subjective: Right TKR with infection.  Patient continues to feel much better today. Wife Lupita Leash is bedside with him. Lab cultures came back at the office today which show a white count of 136,900. There is still no growth on the culture since Friday and also no crystals seen.    Activity level:  wbat Diet tolerance:  ok but NPO after midnight. Voiding:  ok Patient reports pain as mild.    Objective: Vital signs in last 24 hours: Temp:  [97.6 F (36.4 C)-97.8 F (36.6 C)] 97.6 F (36.4 C) (06/03 0539) Pulse Rate:  [76-77] 76 (06/03 0539) Resp:  [16-20] 18 (06/03 0539) BP: (127-150)/(69-77) 145/77 (06/03 0539) SpO2:  [97 %-100 %] 99 % (06/03 0539)  Labs: Recent Labs    02/11/23 0640 02/12/23 0523 02/13/23 0515  HGB 12.2* 10.7* 11.2*   Recent Labs    02/12/23 0523 02/13/23 0515  WBC 7.1 6.9  RBC 3.63* 3.67*  HCT 32.2* 32.4*  PLT 288 292   Recent Labs    02/12/23 0523 02/13/23 0515  NA 128* 129*  K 3.8 3.6  CL 95* 95*  CO2 22 24  BUN 28* 29*  CREATININE 1.02 0.91  GLUCOSE 129* 123*  CALCIUM 8.8* 8.7*   No results for input(s): "LABPT", "INR" in the last 72 hours.  Physical Exam:  Neurologically intact ABD soft Neurovascular intact Sensation intact distally Intact pulses distally Dorsiflexion/Plantar flexion intact No cellulitis present Compartment soft Knee ROM from 0-90 degrees. No tenderness to palpation throughout . Skin is benign.  Assessment/Plan: Liekly infected Right TKR    By exam and presentation Peter Thompson has an infected TKR but the cultures from his synovial fluid have come back showing no growth and no crystals seen. His cell count from the knee aspirate came back significantly high at 136,900. The plan is still for surgery tomorrow but we are trying to decide on a washout arthroscopy since no bacteria has grown versus a full revision. Peter Thompson wants as little done as possible and is hoping for a clean out scope along with follow up antibiotics since  nothing has grown on the office synovial fluid that was sent off. We will get in touch with the ID team and formulate a plan this afternoon. We greatly appreciate medical and ID management.    Ginger Organ  02/13/2023, 1:26 PM

## 2023-02-13 NOTE — Progress Notes (Signed)
I spoke with Greig Castilla from ortho team -- joint had 100k wbc and no crystal; cx negative  I discussed that in the setting of bacteremia, high wbc count and acute monoarthritis, this is septic joint until proven otherwise   Awaiting final ortho decision regarding management of the joint; agree I&D and possibly poly exchange would be reasonable vs 2 stage exchange

## 2023-02-13 NOTE — Progress Notes (Signed)
**Note De-Identified vi Obfusction** PROGRESS NOTE    Peter Thompson  NGE:[id number] DOB: 02-Sep-1966 DOA: 02/10/2023 PCP: Quitmn Livings, MD  Chief Complint  [name] presents with   Knee Pin    Brief Nrrtive:    Peter Thompson is  57 y.o. 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  According to ptient, he suddenly strted right knee pin 2 dys go nd then noticed some swelling.  Went to see orthopedics Dr. Jerl Sntos in the office, underwent rthrocentesis of the right knee.  They thought tht the fluid looked infectious/purulent so they sent him to the ED.  [name] denies ny fever, chills, sweting, chest pin, shortness of breth, ny trum to the knee or ny other complint.  He hs been on opioids, oxycodone 20 mg p.o. every 4 hours s needed for lmost 38 yers, when he hd neck surgery done since he ws in the [employer].   ED Course: Upon rrivl to ED, he ws hemodynmiclly stble.  Lbs showed mild hypontremi nd mild hypoklemi.  Mild leukocytosis.  He ws given Rocephin nd vncomycin nd hospitlist were consulted for dmission.  Per EDP, orthopedics is wre of his hospitliztion nd they pln to do wshout of the knee t some point in time.   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  Psteurell Bcteremi:  S/p rthrocentesis, will follow culture results (pending from outside ortho office) Blood cultures with psteurell multocid in 1/2 sets, BCID negtive Unsyn   Echo from 2023 with EF 60-65% Pln for surgery Tuesdy with hrdwre  removl nd bx spcer Continue oxycodone 20 mg q4 (home regimen), will dd 5-10 mg oxycodone prn on top Schedule tylenol nd tordol  Eliquis for tril fibrilltion is on hold for now Apprecite ID recs  Atril Fibrilltion Chdvsc is 2 for HTN nd PVD  Eliquis is on hold Continue lbetlol   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  Diffuse therosclerosis on L, no stenosis.  Lst seen by Dr. Bing Mtter 04/2022, t tht time ws on plvix -> he tells me he ws told to discontinue this nd continue eliquis There's  telephone note from 10/2022 which notes he ws supposed to be tking both plvix nd eliquis  There's  not from 4/25 with crdiology tht mentions continuing current regimen? Will clrify with vsculr recommendtions post op    Essentil hypertension: [name] ppers to be on severl medictions including mlodipine, clonidine, hydrlzine, lbetlol, lisinopril nd hydrochlorothizide.  BP's ok right now on only clonidine nd lbetlol Will continue mlodipine, clonidine, lbetlol, lsix  Hold lisinopril, HCTZ, hydrlzine for now since BP resonbly controlled    Hyperlipidemi: Resume Zeti    Obstructive sleep pne: CPAP    Hypoklemi: Replenish.    Chronic hypontremi: Upon chrt review, it sounds like he hs intermittent chronic hypontremi.  He is symptomtic currently.   Some mild LE edem, he's on lsix, will dd fluid restriction nd follow     DVT prophylxis: SCD Code Sttus: full Fmily Communiction: none Disposition:   Sttus is: Inptient Remins inptient pproprite becuse: need  for ortho intervention, ID input   Consultants:  ID ortho  Procedures:  none  Antimicrobials:  Anti-infectives (From admission, onward)    Start     Dose/Rate Route Frequency Ordered Stop   02/12/23 1800  Ampicillin-Sulbactam (UNASYN) 3 g in sodium  chloride 0.9 % 100 mL IVPB        3 g 200 mL/hr over 30 Minutes Intravenous Every 6 hours 02/12/23 1651     02/11/23 1600  cefTRIAXone (ROCEPHIN) 2 g in sodium chloride 0.9 % 100 mL IVPB  Status:  Discontinued        2 g 200 mL/hr over 30 Minutes Intravenous Every 24 hours 02/10/23 1522 02/11/23 0917   02/11/23 1000  ceFEPIme (MAXIPIME) 2 g in sodium chloride 0.9 % 100 mL IVPB  Status:  Discontinued        2 g 200 mL/hr over 30 Minutes Intravenous Every 8 hours 02/11/23 0917 02/12/23 1638   02/11/23 0300  vancomycin (VANCOREADY) IVPB 1250 mg/250 mL  Status:  Discontinued        1,250 mg 166.7 mL/hr over 90 Minutes Intravenous Every 12 hours 02/10/23 1538 02/11/23 1514   02/10/23 1530  cefTRIAXone (ROCEPHIN) 1 g in sodium chloride 0.9 % 100 mL IVPB        1 g 200 mL/hr over 30 Minutes Intravenous STAT 02/10/23 1518 02/10/23 1704   02/10/23 1300  vancomycin (VANCOREADY) IVPB 2000 mg/400 mL        2,000 mg 200 mL/hr over 120 Minutes Intravenous  Once 02/10/23 1258 02/10/23 1631   02/10/23 1245  cefTRIAXone (ROCEPHIN) 1 g in sodium chloride 0.9 % 100 mL IVPB        1 g 200 mL/hr over 30 Minutes Intravenous  Once 02/10/23 1234 02/10/23 1432       Subjective: No new complaints Still with pain   Objective: Vitals:   02/12/23 0529 02/12/23 1344 02/12/23 1938 02/13/23 0539  BP: (!) 140/77 (!) 150/70 127/69 (!) 145/77  Pulse: 72 76 77 76  Resp: 18 20 16 18   Temp: 98.6 F (37 C) 97.8 F (36.6 C) 97.6 F (36.4 C) 97.6 F (36.4 C)  TempSrc: Oral Oral    SpO2: 99% 97% 100% 99%  Weight: 109.7 kg     Height:        Intake/Output Summary (Last 24 hours) at 02/13/2023 0913 Last data filed at 02/13/2023 0500 Gross per 24 hour  Intake 1120.12 ml  Output --  Net 1120.12 ml   Filed Weights   02/10/23 1226 02/12/23 0529  Weight: 113.4 kg 109.7 kg    Examination:  General: No acute distress. Cardiovascular: RRR Lungs: unlabored Neurological: Alert and oriented 3. Moves all  extremities 4 with equal strength. Cranial nerves II through XII grossly intact. Extremities: trace le edema, R knee swelling, scar - venous stasis changes     Data Reviewed: I have personally reviewed following labs and imaging studies  CBC: Recent Labs  Lab 02/10/23 1325 02/11/23 0640 02/12/23 0523 02/13/23 0515  WBC 12.7* 8.7 7.1 6.9  NEUTROABS  --   --  4.8 4.4  HGB 11.8* 12.2* 10.7* 11.2*  HCT 34.3* 35.4* 32.2* 32.4*  MCV 86.6 87.2 88.7 88.3  PLT 274 281 288 292    Basic Metabolic Panel: Recent Labs  Lab 02/10/23 1325 02/10/23 1517 02/11/23 0640 02/12/23 0523 02/13/23 0515  NA 129*  --  129* 128* 129*  K 3.2*  --  3.9 3.8 3.6  CL 88*  --  93* 95* 95*  CO2 29  --  24 22 24   GLUCOSE 167*  --  108* 129* 123*  BUN 19  --  20 28* 29*  CRETININE 0.78  --  0.75 1.02 0.91  CLCIUM 9.1  --  9.3 8.8* 8.7*  MG  --  2.1  --  1.8 1.9  PHOS  --   --   --  4.7* 5.0*    GFR: Estimated Creatinine Clearance: 111.1 mL/min (by C-G formula based on SCr of 0.91 mg/dL).  Liver Function Tests: Recent Labs  Lab 02/12/23 0523 02/13/23 0515  ST 36 33  LT 43 42  LKPHOS 98 103  BILITOT 1.1 1.0  PROT 6.9 7.2  LBUMIN 3.6 3.8    CBG: Recent Labs  Lab 02/10/23 2104  GLUCP 139*     Recent Results (from the past 240 hour(s))  Blood culture (routine x 2)     Status: None (Preliminary result)   Collection Time: 02/10/23  1:25 PM   Specimen: BLOOD LEFT FORERM  Result Value Ref Range Status   Specimen Description   Final    BLOOD LEFT FORERM Performed at [facility], [address] [place], [place] [address]    Special Requests   Final    BOTTLES DRWN EROBIC ND NEROBIC Blood Culture results may not be optimal due to an excessive volume of blood received in culture bottles Performed at [facility], [address] [place], [place] [address]    Culture   Final    NO GROWTH 3 DYS Performed at [facility], [address] [place], [place] [address]    Report Status PENDING  Incomplete  Blood culture (routine x 2)     Status: bnormal   Collection Time: 02/10/23  1:30 PM   Specimen: BLOOD RIGHT FORERM  Result Value Ref Range Status   Specimen Description   Final    BLOOD RIGHT FORERM Performed at [facility], [address] [place], [place] [address]    Special Requests   Final    BOTTLES DRWN EROBIC ND NEROBIC Blood Culture results may not be optimal due to an excessive volume of blood received in culture bottles Performed at [facility], [address] [place], [place] [address]    Culture  Setup Time   Final    GRM NEGTIVE RODS NEROBIC BOTTLE ONLY CRITICL RESULT CLLED TO, RED BCK BY ND VERIFIED WITH: PHRMD J. GDHI 02/11/23 @ 0731 BY B    Culture ()  Final    PSTEURELL MULTOCID Usually susceptible to penicillin and other beta lactam agents,quinolones,macrolides and tetracyclines. Performed at [facility], [address], [place], [place] [address]    Report Status 02/13/2023 FINL  Final  Blood Culture ID Panel (Reflexed)     Status: None   Collection Time: 02/10/23  1:30 PM  Result Value Ref Range Status   Enterococcus faecalis NOT DETECTED NOT DETECTED Final   Enterococcus Faecium NOT DETECTED NOT DETECTED Final   Listeria monocytogenes NOT DETECTED NOT DETECTED Final   Staphylococcus species NOT DETECTED NOT DETECTED Final   Staphylococcus aureus (BCID) NOT DETECTED NOT DETECTED Final   Staphylococcus epidermidis NOT DETECTED NOT DETECTED Final   Staphylococcus lugdunensis NOT DETECTED NOT DETECTED Final   Streptococcus species NOT DETECTED NOT DETECTED Final   Streptococcus agalactiae NOT DETECTED NOT DETECTED Final   Streptococcus pneumoniae NOT DETECTED NOT DETECTED Final   Streptococcus  pyogenes NOT DETECTED NOT DETECTED Final   .calcoaceticus-baumannii NOT DETECTED NOT DETECTED Final   Bacteroides fragilis NOT DETECTED NOT  DETECTED Final   Enterobacterales NOT DETECTED NOT DETECTED Final   Enterobacter cloacae complex NOT DETECTED NOT DETECTED Final   Escherichia coli NOT DETECTED NOT DETECTED Final   Klebsiella aerogenes NOT DETECTED NOT DETECTED Final   Klebsiella oxytoca NOT DETECTED NOT DETECTED Final   Klebsiella pneumoniae NOT DETECTED NOT DETECTED Final   Proteus species NOT DETECTED NOT DETECTED Final   Salmonella species NOT DETECTED NOT DETECTED Final   Serratia marcescens NOT DETECTED NOT DETECTED Final   Haemophilus influenzae NOT DETECTED NOT DETECTED Final   Neisseria meningitidis NOT DETECTED NOT DETECTED Final   Pseudomonas aeruginosa NOT DETECTED NOT DETECTED Final   Stenotrophomonas maltophilia NOT DETECTED NOT DETECTED Final   Candida albicans NOT DETECTED NOT DETECTED Final   Candida auris NOT DETECTED NOT DETECTED Final   Candida glabrata NOT DETECTED NOT DETECTED Final   Candida krusei NOT DETECTED NOT DETECTED Final   Candida parapsilosis NOT DETECTED NOT DETECTED Final   Candida tropicalis NOT DETECTED NOT DETECTED Final   Cryptococcus neoformans/gattii NOT DETECTED NOT DETECTED Final    Comment: Performed at [facility], [address], [place], [place] [address]         Radiology Studies: No results found.      Scheduled Meds:  acetaminophen  1,000 mg Oral Q8H   amLODipine  10 mg Oral Daily   cloNIDine  0.2 mg Oral TID   furosemide  80 mg Oral Daily   ketorolac  15 mg Intravenous Q6H   labetalol  300 mg Oral BID   oxyCODONE  20 mg Oral Q4H   pantoprazole  40 mg Oral Daily   polyethylene glycol  17 g Oral BID   Continuous Infusions:  ampicillin-sulbactam (UNSYN) IV 3 g (02/13/23 0535)     LOS: 3 days    Time spent: over 30 min    Lacretia Nicks, MD [facility]   To contact the attending provider between 7-7P or the covering provider during after hours 7P-7, please log into the web site [email or web address] and access using universal [facility] password for that web site. If you do not have the password, please call the hospital operator.  02/13/2023, 9:13 M

## 2023-02-13 NOTE — H&P (View-Only) (Signed)
Subjective: Right TKR with infection.  Patient continues to feel much better today. Wife Peter Thompson is bedside with him. Lab cultures came back at the office today which show a white count of 136,900. There is still no growth on the culture since Friday and also no crystals seen.    Activity level:  wbat Diet tolerance:  ok but NPO after midnight. Voiding:  ok Patient reports pain as mild.    Objective: Vital signs in last 24 hours: Temp:  [97.6 F (36.4 C)-97.8 F (36.6 C)] 97.6 F (36.4 C) (06/03 0539) Pulse Rate:  [76-77] 76 (06/03 0539) Resp:  [16-20] 18 (06/03 0539) BP: (127-150)/(69-77) 145/77 (06/03 0539) SpO2:  [97 %-100 %] 99 % (06/03 0539)  Labs: Recent Labs    02/11/23 0640 02/12/23 0523 02/13/23 0515  HGB 12.2* 10.7* 11.2*   Recent Labs    02/12/23 0523 02/13/23 0515  WBC 7.1 6.9  RBC 3.63* 3.67*  HCT 32.2* 32.4*  PLT 288 292   Recent Labs    02/12/23 0523 02/13/23 0515  NA 128* 129*  K 3.8 3.6  CL 95* 95*  CO2 22 24  BUN 28* 29*  CREATININE 1.02 0.91  GLUCOSE 129* 123*  CALCIUM 8.8* 8.7*   No results for input(s): "LABPT", "INR" in the last 72 hours.  Physical Exam:  Neurologically intact ABD soft Neurovascular intact Sensation intact distally Intact pulses distally Dorsiflexion/Plantar flexion intact No cellulitis present Compartment soft Knee ROM from 0-90 degrees. No tenderness to palpation throughout . Skin is benign.  Assessment/Plan: Liekly infected Right TKR    By exam and presentation Peter Thompson has an infected TKR but the cultures from his synovial fluid have come back showing no growth and no crystals seen. His cell count from the knee aspirate came back significantly high at 136,900. The plan is still for surgery tomorrow but we are trying to decide on a washout arthroscopy since no bacteria has grown versus a full revision. Peter Thompson wants as little done as possible and is hoping for a clean out scope along with follow up antibiotics since  nothing has grown on the office synovial fluid that was sent off. We will get in touch with the ID team and formulate a plan this afternoon. We greatly appreciate medical and ID management.     Peter Thompson  02/13/2023, 1:26 PM  

## 2023-02-13 NOTE — Progress Notes (Signed)
[facility]  Date of Admission:  02/10/2023      Lines: Peripheral iv's  Abx: 6/02-c amp-sulb  5/31-6/02 ceftriaxone/vanc   ASSESSMENT: 57 yo male veteran, hx right knee arthroplasty 2019-2020, hx left hip arthroplasty, orif left ankle & right arm & neck, admitted 5/31 for planned I&D of the right knee after developing acute 2 days pain in the knee. 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  Patient does have a service dog and reports the dog often licks him, likely source  Pending final surgery decision   PLAN: I will ask ortho to update their office ortho aspirate cx; message sent via secure chat to ortho team Suspect same organism but would like to be sure Changed abx to amp-sulbactam, and for opat purpose will see if a more convenient abx is available Depending on final surgery decision will have final abx recommendation Discussed with primary team   I spent more than 50 minute reviewing data/chart, and coordinating care, providing direct face to face time providing counseling/discussing diagnostics/treatment plan with patient and treatment team   Principal Problem:   Septic arthritis of knee (HCC) Active Problems:   Essential (primary) hypertension   Paroxysmal atrial fibrillation (HCC)   Obesity   Obstructive sleep apnea   Infected prosthetic knee joint (HCC)   Gram-negative bacteremia   Peripheral vascular disease (HCC)   No Known Allergies  Scheduled Meds:  acetaminophen  1,000 mg Oral Q8H   amLODipine  10 mg Oral Daily   cloNIDine  0.2 mg Oral TID   furosemide  80 mg Oral Daily   ketorolac  15 mg Intravenous Q6H   labetalol  300 mg Oral BID   oxyCODONE  20 mg Oral Q4H   pantoprazole  40 mg Oral Daily   polyethylene glycol  17 g Oral BID   Continuous Infusions:  ampicillin-sulbactam (UNASYN) IV 3 g (02/13/23 0535)   PRN  Meds:.acetaminophen **FOLLOWED BY** [START ON 02/14/2023] acetaminophen, albuterol, ALPRAZolam, ondansetron **OR** ondansetron (ZOFRAN) IV, oxyCODONE **OR** oxyCODONE   SUBJECTIVE: Chronic pain in various part of body but new right knee pain as per h&P Afebrile  Ortho to update their clinic right knee aspirate cx No n/v/diarrhea  Review of Systems: ROS All other ROS was negative, except mentioned above     OBJECTIVE: Vitals:   02/12/23 0529 02/12/23 1344 02/12/23 1938 02/13/23 0539  BP: (!) 140/77 (!) 150/70 127/69 (!) 145/77  Pulse: 72 76 77 76  Resp: 18 20 16 18   Temp: 98.6 F (37 C) 97.8 F (36.6 C) 97.6 F (36.4 C) 97.6 F (36.4 C)  TempSrc: Oral Oral    SpO2: 99% 97% 100% 99%  Weight: 109.7 kg     Height:       Body mass index is 34.7 kg/m.  Physical Exam General/constitutional: no distress, pleasant HEENT: Normocephalic, PER, Conj Clear, EOMI, Oropharynx clear Neck supple CV: rrr no mrg Lungs: clear to auscultation, normal respiratory effort Abd: Soft, Nontender Ext: trace bilateral LE edema with chronic hyperpigmentation due to previous venous stasis Skin: No Rash Neuro: nonfocal MSK: slight swelling right knee; good intact rom right knee still     Lab Results Lab Results  Component Value Date   WBC 6.9 02/13/2023   HGB 11.2 (L) 02/13/2023   HCT 32.4 (L) 02/13/2023   MCV 88.3 02/13/2023   PLT 292  02/13/2023    Lab Results  Component Value Date   CREATININE 0.91 02/13/2023   BUN 29 (H) 02/13/2023   NA 129 (L) 02/13/2023   K 3.6 02/13/2023   CL 95 (L) 02/13/2023   CO2 24 02/13/2023    Lab Results  Component Value Date   ALT 42 02/13/2023   AST 33 02/13/2023   ALKPHOS 103 02/13/2023   BILITOT 1.0 02/13/2023      Microbiology: Recent Results (from the past 240 hour(s))  Blood culture (routine x 2)     Status: None (Preliminary result)   Collection Time: 02/10/23  1:25 PM   Specimen: BLOOD LEFT FOREARM  Result Value Ref Range Status    Specimen Description   Final    BLOOD LEFT FOREARM Performed at [facility], [address] [place], [place] [address]    Special Requests   Final    BOTTLES DRAWN AEROBIC AND ANAEROBIC Blood Culture results may not be optimal due to an excessive volume of blood received in culture bottles Performed at [facility], [address] [place], [place] [address]    Culture   Final    NO GROWTH 3 DAYS Performed at [facility], [address], [place], [place] [address]    Report Status PENDING  Incomplete  Blood culture (routine x 2)     Status: Abnormal   Collection Time: 02/10/23  1:30 PM   Specimen: BLOOD RIGHT FOREARM  Result Value Ref Range Status   Specimen Description   Final    BLOOD RIGHT FOREARM Performed at [facility], [address] [place], [place] [address]    Special Requests   Final    BOTTLES DRAWN AEROBIC AND ANAEROBIC Blood Culture results may not be optimal due to an excessive volume of blood received in culture bottles Performed at [facility], [address], [place], [place] [address]    Culture  Setup Time   Final    GRAM NEGATIVE RODS ANAEROBIC BOTTLE ONLY CRITICAL RESULT CALLED TO, READ BACK BY AND VERIFIED WITH: PHARMD J. GADHIA 02/11/23 @ 0731 BY AB    Culture (A)  Final    PASTEURELLA MULTOCIDA Usually susceptible to penicillin and other beta lactam agents,quinolones,macrolides and tetracyclines. Performed at [facility], [address], [place], [place] [address]    Report Status 02/13/2023 FINAL  Final  Blood Culture ID Panel (Reflexed)     Status: None   Collection Time: 02/10/23  1:30 PM  Result Value Ref Range Status   Enterococcus faecalis NOT DETECTED NOT DETECTED Final   Enterococcus Faecium NOT DETECTED NOT DETECTED Final   Listeria monocytogenes NOT DETECTED NOT DETECTED Final   Staphylococcus species NOT DETECTED NOT DETECTED Final   Staphylococcus aureus (BCID) NOT  DETECTED NOT DETECTED Final   Staphylococcus epidermidis NOT DETECTED NOT DETECTED Final   Staphylococcus lugdunensis NOT DETECTED NOT DETECTED Final   Streptococcus species NOT DETECTED NOT DETECTED Final   Streptococcus agalactiae NOT DETECTED NOT DETECTED Final   Streptococcus pneumoniae NOT DETECTED NOT DETECTED Final   Streptococcus pyogenes NOT DETECTED NOT DETECTED Final   A.calcoaceticus-baumannii NOT DETECTED NOT DETECTED Final   Bacteroides fragilis NOT DETECTED NOT DETECTED Final   Enterobacterales NOT DETECTED NOT DETECTED Final   Enterobacter cloacae complex NOT DETECTED NOT DETECTED Final   Escherichia coli NOT DETECTED NOT DETECTED Final   Klebsiella aerogenes NOT DETECTED NOT DETECTED Final   Klebsiella oxytoca NOT  DETECTED NOT DETECTED Final   Klebsiella pneumoniae NOT DETECTED NOT DETECTED Final   Proteus species NOT DETECTED NOT DETECTED Final   Salmonella species NOT DETECTED NOT DETECTED Final   Serratia marcescens NOT DETECTED NOT DETECTED Final   Haemophilus influenzae NOT DETECTED NOT DETECTED Final   Neisseria meningitidis NOT DETECTED NOT DETECTED Final   Pseudomonas aeruginosa NOT DETECTED NOT DETECTED Final   Stenotrophomonas maltophilia NOT DETECTED NOT DETECTED Final   Candida albicans NOT DETECTED NOT DETECTED Final   Candida auris NOT DETECTED NOT DETECTED Final   Candida glabrata NOT DETECTED NOT DETECTED Final   Candida krusei NOT DETECTED NOT DETECTED Final   Candida parapsilosis NOT DETECTED NOT DETECTED Final   Candida tropicalis NOT DETECTED NOT DETECTED Final   Cryptococcus neoformans/gattii NOT DETECTED NOT DETECTED Final    Comment: Performed at [facility], [address] [place], [place] [address]     Serology:   Imaging: If present, new imagings (plain films, ct scans, and mri) have been personally visualized and interpreted; radiology reports have been reviewed. Decision making incorporated into the Impression /  Recommendations.   Raymondo Band, MD [facility] [phone] pager    02/13/2023, 11:43 AM

## 2023-02-13 NOTE — Plan of Care (Signed)

## 2023-02-13 NOTE — TOC Initial Note (Signed)
Transition of Care [facility]) - Initial/Assessment Note    Patient Details  Name: Peter Thompson MRN: [id number] Date of Birth: 08/05/1966  Transition of Care [facility]) CM/SW Contact:    Otelia Santee, LCSW Phone Number: 02/13/2023, 9:43 AM  Clinical Narrative:                 Pt presenting with right knee pain. Currently on IV abx and plan for OR on 6/4.  TOC will continue to follow for discharge needs.  Expected Discharge Plan: Home/Self Care Barriers to Discharge: Continued Medical Work up   Patient Goals and CMS Choice Patient states their goals for this hospitalization and ongoing recovery are:: To return home CMS Medicare.gov Compare Post Acute Care list provided to:: Patient Choice offered to / list presented to : Patient [place] ownership interest in [facility].provided to:: Patient    Expected Discharge Plan and Services In-house Referral: NA Discharge Planning Services: NA Post Acute Care Choice: NA Living arrangements for the past 2 months: Single Family Home                 DME Arranged: N/A DME Agency: NA                  Prior Living Arrangements/Services Living arrangements for the past 2 months: Single Family Home Lives with:: Spouse Patient language and need for interpreter reviewed:: Yes Do you feel safe going back to the place where you live?: Yes      Need for Family Participation in Patient Care: No (Comment) Care giver support system in place?: No (comment)   Criminal Activity/Legal Involvement Pertinent to Current Situation/Hospitalization: No - Comment as needed  Activities of Daily Living Home Assistive Devices/Equipment: Crutches ADL Screening (condition at time of admission) Patient's cognitive ability adequate to safely complete daily activities?: Yes Is the patient deaf or have difficulty hearing?: No Does the patient have difficulty seeing, even when wearing glasses/contacts?: No Does the patient have difficulty  concentrating, remembering, or making decisions?: No Patient able to express need for assistance with ADLs?: No Does the patient have difficulty dressing or bathing?: No Independently performs ADLs?: Yes (appropriate for developmental age) Does the patient have difficulty walking or climbing stairs?: Yes Weakness of Legs: Both Weakness of Arms/Hands: None  Permission Sought/Granted   Permission granted to share information with : No              Emotional Assessment       Orientation: : Oriented to Self, Oriented to Place, Oriented to  Time, Oriented to Situation Alcohol / Substance Use: Not Applicable Psych Involvement: No (comment)  Admission diagnosis:  Septic arthritis of knee (HCC) [M00.9] Infection of prosthetic knee joint, initial encounter (HCC) [W09.81XB, Z96.659] Patient Active Problem List   Diagnosis Date Noted   Infected prosthetic knee joint (HCC) 02/11/2023   Gram-negative bacteremia 02/11/2023   Peripheral vascular disease (HCC) 02/11/2023   Septic arthritis of knee (HCC) 02/10/2023   Chronic tension-type headache, intractable 07/11/2022   Peripheral vascular disease, unspecified (HCC) 04/21/2022   Sensorineural hearing loss, bilateral 10/21/2021   Venous stasis dermatitis of both lower extremities 09/07/2021   Tinea pedis of both feet 09/07/2021   Contusion of left foot 09/07/2021   Umbilical hernia without obstruction and without gangrene 03/04/2021   Abnormal results of liver function studies 01/21/2021   Acute gastritis 01/21/2021   History of arthroscopic procedure on shoulder 01/21/2021   Low back pain 01/21/2021   Other and  unspecified hyperlipidemia 01/21/2021   Obesity 01/21/2021   Other specified counseling 01/21/2021   Other, mixed, or unspecified nondependent drug abuse, unspecified 01/21/2021   Psychosocial circumstance 01/21/2021   Concussion    Arthritis    Cervicogenic headache 10/06/2020   Permanent atrial fibrillation (HCC)  07/17/2020   Tinnitus, bilateral    Neck fracture (HCC)    Insomnia    Hypertension    DJD (degenerative joint disease)    Complication of anesthesia    Bursitis    Osteoarthrosis, unspecified whether generalized or localized, other specified sites    Anxiety    Obstructive sleep apnea (adult) (pediatric) 09/04/2019   Obstructive sleep apnea 09/04/2019   Left-sided epistaxis 01/30/2019   Paroxysmal atrial fibrillation (HCC) 12/26/2018   Essential (primary) hypertension 10/17/2018   Anemia 10/17/2018   Essential hypertension 10/17/2018   Persistent atrial fibrillation (HCC)    Primary osteoarthritis of right knee 10/02/2018   Pre-op evaluation 12/05/2016   Smoking 12/05/2016   Primary localized osteoarthritis of left hip 05/10/2016   Primary osteoarthritis of left hip 05/10/2016   PCP:  Quitman Livings, MD Pharmacy:   [phone] [place], [place] - [address] [place] [address] Phone: [phone] Fax: [phone]     Social Determinants of Health (SDOH) Social History: SDOH Screenings   Food Insecurity: No Food Insecurity (02/11/2023)  Housing: Low Risk  (02/11/2023)  Transportation Needs: No Transportation Needs (02/11/2023)  Utilities: Not At Risk (02/11/2023)  Tobacco Use: Medium Risk (02/10/2023)   SDOH Interventions:     Readmission Risk Interventions    02/13/2023    9:41 AM  Readmission Risk Prevention Plan  Post Dischage Appt Complete  Medication Screening Complete  Transportation Screening Complete

## 2023-02-13 NOTE — Progress Notes (Signed)
   02/13/23 2247  BiPAP/CPAP/SIPAP  BiPAP/CPAP/SIPAP Pt Type Adult (PT DECLINED ASSISTANCE WITH CPAP.  PT PREFERS AND IS COMFORTABLE WITH SELF-PLACEMENT.)  BiPAP/CPAP/SIPAP Resmed  Mask Type Full face mask (MASK FROM HOME)  EPAP 16 cmH2O (HOME SETTINGS PER PT)  FiO2 (%) 21 %  Patient Home Equipment No (ONLY HIS MASK FROM HOME)  Auto Titrate No  CPAP/SIPAP surface wiped down Yes

## 2023-02-14 ENCOUNTER — Encounter (HOSPITAL_COMMUNITY)
Admission: EM | Disposition: A | Discharge status: Home Health | Payer: Self-pay | Source: Home / Self Care | Attending: Family Medicine

## 2023-02-14 ENCOUNTER — Inpatient Hospital Stay (HOSPITAL_COMMUNITY): Admission: RE | Admit: 2023-02-14 | Payer: Medicaid Other | Source: Ambulatory Visit | Admitting: Orthopaedic Surgery

## 2023-02-14 ENCOUNTER — Inpatient Hospital Stay (HOSPITAL_COMMUNITY): Payer: Medicaid Other | Admitting: Anesthesiology

## 2023-02-14 DIAGNOSIS — Z87891 Personal history of nicotine dependence: Secondary | ICD-10-CM

## 2023-02-14 DIAGNOSIS — T8459XA Infection and inflammatory reaction due to other internal joint prosthesis, initial encounter: Secondary | ICD-10-CM | POA: Diagnosis not present

## 2023-02-14 DIAGNOSIS — Z96659 Presence of unspecified artificial knee joint: Secondary | ICD-10-CM | POA: Diagnosis not present

## 2023-02-14 DIAGNOSIS — I10 Essential (primary) hypertension: Secondary | ICD-10-CM

## 2023-02-14 DIAGNOSIS — D649 Anemia, unspecified: Secondary | ICD-10-CM

## 2023-02-14 DIAGNOSIS — T8453XA Infection and inflammatory reaction due to internal right knee prosthesis, initial encounter: Secondary | ICD-10-CM

## 2023-02-14 HISTORY — PX: KNEE ARTHROSCOPY: SHX127

## 2023-02-14 LAB — PHOSPHORUS: Phosphorus: 3.7 mg/dL (ref 2.5–4.6)

## 2023-02-14 LAB — CULTURE, BLOOD (ROUTINE X 2)

## 2023-02-14 LAB — BASIC METABOLIC PANEL
Anion gap: 11 (ref 5–15)
BUN: 25 mg/dL — ABNORMAL HIGH (ref 6–20)
CO2: 23 mmol/L (ref 22–32)
Calcium: 9.1 mg/dL (ref 8.9–10.3)
Chloride: 96 mmol/L — ABNORMAL LOW (ref 98–111)
Creatinine, Ser: 0.66 mg/dL (ref 0.61–1.24)
GFR, Estimated: >60 mL/min (ref >60)
Glucose, Bld: 109 mg/dL — ABNORMAL HIGH (ref 70–99)
Potassium: 4 mmol/L (ref 3.5–5.1)
Sodium: 130 mmol/L — ABNORMAL LOW (ref 135–145)

## 2023-02-14 LAB — CBC
HCT: 32.5 % — ABNORMAL LOW (ref 39.0–52.0)
Hemoglobin: 11 g/dL — ABNORMAL LOW (ref 13.0–17.0)
MCH: 30.3 pg (ref 26.0–34.0)
MCHC: 33.8 g/dL (ref 30.0–36.0)
MCV: 89.5 fL (ref 80.0–100.0)
Platelets: 299 10*3/uL (ref 150–400)
RBC: 3.63 MIL/uL — ABNORMAL LOW (ref 4.22–5.81)
RDW: 13.2 % (ref 11.5–15.5)
WBC: 7.3 10*3/uL (ref 4.0–10.5)
nRBC: 0 % (ref 0.0–0.2)

## 2023-02-14 LAB — AEROBIC/ANAEROBIC CULTURE W GRAM STAIN (SURGICAL/DEEP WOUND)

## 2023-02-14 LAB — MAGNESIUM: Magnesium: 2.1 mg/dL (ref 1.7–2.4)

## 2023-02-14 SURGERY — ARTHROSCOPY, KNEE
Anesthesia: General | Site: Knee | Laterality: Right

## 2023-02-14 MED ORDER — DEXMEDETOMIDINE HCL IN NACL 80 MCG/20ML IV SOLN
INTRAVENOUS | Status: AC
  Filled 2023-02-14: qty 20

## 2023-02-14 MED ORDER — METOCLOPRAMIDE HCL 5 MG PO TABS: 5.0000 mg | ORAL_TABLET | Freq: Three times a day (TID) | ORAL | Status: DC | PRN

## 2023-02-14 MED ORDER — ONDANSETRON HCL 4 MG/2ML IJ SOLN
INTRAMUSCULAR | Status: AC
  Filled 2023-02-14: qty 2

## 2023-02-14 MED ORDER — POVIDONE-IODINE 10 % EX SWAB: 2.0000 | Freq: Once | CUTANEOUS | Status: DC

## 2023-02-14 MED ORDER — OXYCODONE HCL 5 MG/5ML PO SOLN: 5.0000 mg | Freq: Once | ORAL | Status: DC | PRN

## 2023-02-14 MED ORDER — BUPIVACAINE LIPOSOME 1.3 % IJ SUSP
20.0000 mL | Freq: Once | INTRAMUSCULAR | Status: DC
Start: 2023-02-14 — End: 2023-02-14

## 2023-02-14 MED ORDER — ONDANSETRON HCL 4 MG/2ML IJ SOLN
INTRAMUSCULAR | Status: DC | PRN
  Administered 2023-02-14: 4 mg via INTRAVENOUS

## 2023-02-14 MED ORDER — SODIUM CHLORIDE 0.9 % IV SOLN
2000.0000 mg | INTRAVENOUS | Status: DC
Start: 2023-02-14 — End: 2023-02-14

## 2023-02-14 MED ORDER — BUPIVACAINE IN DEXTROSE 0.75-8.25 % IT SOLN
INTRATHECAL | Status: DC | PRN
  Administered 2023-02-14: 1.4 mL via INTRATHECAL

## 2023-02-14 MED ORDER — LACTATED RINGERS IV SOLN: INTRAVENOUS | Status: DC

## 2023-02-14 MED ORDER — OXYCODONE HCL 5 MG PO TABS: 5.0000 mg | ORAL_TABLET | Freq: Once | ORAL | Status: DC | PRN

## 2023-02-14 MED ORDER — FENTANYL CITRATE PF 50 MCG/ML IJ SOSY
PREFILLED_SYRINGE | INTRAMUSCULAR | Status: AC
  Filled 2023-02-14: qty 2

## 2023-02-14 MED ORDER — LIDOCAINE HCL (PF) 2 % IJ SOLN
INTRAMUSCULAR | Status: AC
  Filled 2023-02-14: qty 5

## 2023-02-14 MED ORDER — FLUTICASONE FUROATE-VILANTEROL 200-25 MCG/ACT IN AEPB
1.0000 | INHALATION_SPRAY | Freq: Every day | RESPIRATORY_TRACT | Status: DC
  Filled 2023-02-14: qty 28

## 2023-02-14 MED ORDER — ONDANSETRON HCL 4 MG/2ML IJ SOLN: 4.0000 mg | Freq: Four times a day (QID) | INTRAMUSCULAR | Status: DC | PRN

## 2023-02-14 MED ORDER — HYDROMORPHONE HCL 1 MG/ML IJ SOLN: 0.2500 mg | INTRAMUSCULAR | Status: DC | PRN

## 2023-02-14 MED ORDER — PROPOFOL 10 MG/ML IV BOLUS
INTRAVENOUS | Status: AC
  Filled 2023-02-14: qty 20

## 2023-02-14 MED ORDER — OXYCHLOROSENE SODIUM POWD
Freq: Once | Status: DC
  Filled 2023-02-14: qty 2

## 2023-02-14 MED ORDER — ALPRAZOLAM 0.5 MG PO TABS
1.0000 mg | ORAL_TABLET | Freq: Every day | ORAL | Status: DC
  Administered 2023-02-14: 1 mg via ORAL
  Filled 2023-02-14: qty 2

## 2023-02-14 MED ORDER — ACETAMINOPHEN 500 MG PO TABS: 1000.0000 mg | ORAL_TABLET | Freq: Once | ORAL | Status: DC

## 2023-02-14 MED ORDER — DOCUSATE SODIUM 100 MG PO CAPS
100.0000 mg | ORAL_CAPSULE | Freq: Two times a day (BID) | ORAL | Status: DC
  Administered 2023-02-14 – 2023-02-15 (×2): 100 mg via ORAL
  Filled 2023-02-14 (×2): qty 1

## 2023-02-14 MED ORDER — EPINEPHRINE PF 1 MG/ML IJ SOLN
INTRAMUSCULAR | Status: AC
  Filled 2023-02-14: qty 1

## 2023-02-14 MED ORDER — FENTANYL CITRATE (PF) 100 MCG/2ML IJ SOLN
INTRAMUSCULAR | Status: AC
  Filled 2023-02-14: qty 2

## 2023-02-14 MED ORDER — PROPOFOL 500 MG/50ML IV EMUL
INTRAVENOUS | Status: AC
  Filled 2023-02-14: qty 100

## 2023-02-14 MED ORDER — AMISULPRIDE (ANTIEMETIC) 5 MG/2ML IV SOLN: 10.0000 mg | Freq: Once | INTRAVENOUS | Status: DC | PRN

## 2023-02-14 MED ORDER — MORPHINE SULFATE (PF) 4 MG/ML IV SOLN
4.0000 mg | INTRAVENOUS | Status: DC | PRN
  Administered 2023-02-14 – 2023-02-15 (×2): 4 mg via INTRAVENOUS
  Filled 2023-02-14 (×3): qty 1

## 2023-02-14 MED ORDER — SODIUM CHLORIDE 0.9 % IR SOLN
Status: DC | PRN
  Administered 2023-02-14: 6000 mL

## 2023-02-14 MED ORDER — ONDANSETRON HCL 4 MG PO TABS: 4.0000 mg | ORAL_TABLET | Freq: Four times a day (QID) | ORAL | Status: DC | PRN

## 2023-02-14 MED ORDER — TRANEXAMIC ACID-NACL 1000-0.7 MG/100ML-% IV SOLN: 1000.0000 mg | INTRAVENOUS | Status: DC

## 2023-02-14 MED ORDER — DEXAMETHASONE SODIUM PHOSPHATE 10 MG/ML IJ SOLN
INTRAMUSCULAR | Status: DC | PRN
  Administered 2023-02-14: 5 mg via INTRAVENOUS

## 2023-02-14 MED ORDER — CEFAZOLIN SODIUM-DEXTROSE 2-4 GM/100ML-% IV SOLN
2.0000 g | INTRAVENOUS | Status: DC
Start: 2023-02-15 — End: 2023-02-14

## 2023-02-14 MED ORDER — METOCLOPRAMIDE HCL 5 MG/ML IJ SOLN: 5.0000 mg | Freq: Three times a day (TID) | INTRAMUSCULAR | Status: DC | PRN

## 2023-02-14 MED ORDER — DEXAMETHASONE SODIUM PHOSPHATE 10 MG/ML IJ SOLN
INTRAMUSCULAR | Status: AC
  Filled 2023-02-14: qty 1

## 2023-02-14 MED ORDER — FENTANYL CITRATE PF 50 MCG/ML IJ SOSY
25.0000 ug | PREFILLED_SYRINGE | INTRAMUSCULAR | Status: DC | PRN
  Administered 2023-02-14: 50 ug via INTRAVENOUS

## 2023-02-14 MED ORDER — DEXMEDETOMIDINE HCL IN NACL 80 MCG/20ML IV SOLN
INTRAVENOUS | Status: DC | PRN
  Administered 2023-02-14 (×2): 8 ug via INTRAVENOUS

## 2023-02-14 SURGICAL SUPPLY — 51 items
BAG COUNTER SPONGE SURGICOUNT (BAG) ×1 IMPLANT
BAG SPNG CNTER NS LX DISP (BAG) ×1
BLADE EXCALIBUR 4.0X13 (MISCELLANEOUS) IMPLANT
BLADE EXCALIBUR 5.0X13 (MISCELLANEOUS) IMPLANT
BNDG CMPR 5X62 HK CLSR LF (GAUZE/BANDAGES/DRESSINGS) ×1
BNDG CMPR MED 10X6 ELC LF (GAUZE/BANDAGES/DRESSINGS) ×1
BNDG ELASTIC 6INX 5YD STR LF (GAUZE/BANDAGES/DRESSINGS) ×1 IMPLANT
BNDG ELASTIC 6X10 VLCR STRL LF (GAUZE/BANDAGES/DRESSINGS) IMPLANT
BNDG GAUZE DERMACEA FLUFF 4 (GAUZE/BANDAGES/DRESSINGS) ×1 IMPLANT
BNDG GZE DERMACEA 4 6PLY (GAUZE/BANDAGES/DRESSINGS) ×1
BONE TUNNEL PLUG CANNULATED (MISCELLANEOUS) ×1 IMPLANT
BURR OVAL 8 FLU 4.0X13 (MISCELLANEOUS) ×1 IMPLANT
COVER SURGICAL LIGHT HANDLE (MISCELLANEOUS) IMPLANT
DISSECTOR 3.5MM X 13CM (MISCELLANEOUS) ×1 IMPLANT
DRAPE ARTHROSCOPY W/POUCH 114 (DRAPES) ×1 IMPLANT
DRAPE SHEET LG 3/4 BI-LAMINATE (DRAPES) ×1 IMPLANT
DRAPE U-SHAPE 47X51 STRL (DRAPES) ×1 IMPLANT
DRSG ADAPTIC 3X8 NADH LF (GAUZE/BANDAGES/DRESSINGS) IMPLANT
DRSG EMULSION OIL 3X3 NADH (GAUZE/BANDAGES/DRESSINGS) ×1 IMPLANT
DURAPREP 26ML APPLICATOR (WOUND CARE) ×2 IMPLANT
EVACUATOR 1/8 PVC DRAIN (DRAIN) IMPLANT
GAUZE 4X4 16PLY ~~LOC~~+RFID DBL (SPONGE) ×1 IMPLANT
GAUZE PAD ABD 8X10 STRL (GAUZE/BANDAGES/DRESSINGS) ×1 IMPLANT
GAUZE SPONGE 4X4 12PLY STRL (GAUZE/BANDAGES/DRESSINGS) ×1 IMPLANT
GAUZE XEROFORM 1X8 LF (GAUZE/BANDAGES/DRESSINGS) IMPLANT
GLOVE BIO SURGEON STRL SZ8 (GLOVE) ×2 IMPLANT
GLOVE BIOGEL PI IND STRL 7.0 (GLOVE) ×1 IMPLANT
GLOVE BIOGEL PI IND STRL 8 (GLOVE) ×2 IMPLANT
GLOVE SURG SYN 7.0 (GLOVE) ×1 IMPLANT
GLOVE SURG SYN 7.0 PF PI (GLOVE) ×1 IMPLANT
GOWN SRG XL LVL 4 BRTHBL STRL (GOWNS) ×1 IMPLANT
GOWN STRL NON-REIN XL LVL4 (GOWNS) ×1
GOWN STRL REUS W/ TWL XL LVL3 (GOWN DISPOSABLE) ×2 IMPLANT
GOWN STRL REUS W/TWL XL LVL3 (GOWN DISPOSABLE) ×2
KIT BASIN OR (CUSTOM PROCEDURE TRAY) ×1 IMPLANT
KIT TURNOVER KIT A (KITS) IMPLANT
MANIFOLD NEPTUNE II (INSTRUMENTS) ×1 IMPLANT
NDL HYPO 22X1.5 SAFETY MO (MISCELLANEOUS) ×1 IMPLANT
NDL SPNL 18GX3.5 QUINCKE PK (NEEDLE) IMPLANT
NEEDLE HYPO 22X1.5 SAFETY MO (MISCELLANEOUS) ×1 IMPLANT
NEEDLE SPNL 18GX3.5 QUINCKE PK (NEEDLE) IMPLANT
PACK ARTHROSCOPY WL (CUSTOM PROCEDURE TRAY) ×1 IMPLANT
PAD ARMBOARD 7.5X6 YLW CONV (MISCELLANEOUS) ×2 IMPLANT
PAD CAST CTTN 4X4 STRL (SOFTGOODS) IMPLANT
PADDING CAST COTTON 4X4 STRL (SOFTGOODS) ×1
PENCIL SMOKE EVACUATOR (MISCELLANEOUS) IMPLANT
SYR CONTROL 10ML LL (SYRINGE) ×1 IMPLANT
TOWEL OR 17X26 10 PK STRL BLUE (TOWEL DISPOSABLE) ×1 IMPLANT
TRAY FOLEY MTR SLVR 16FR STAT (SET/KITS/TRAYS/PACK) IMPLANT
TUBING ARTHROSCOPY IRRIG 16FT (MISCELLANEOUS) ×1 IMPLANT
WATER STERILE IRR 1000ML POUR (IV SOLUTION) ×1 IMPLANT

## 2023-02-14 NOTE — Transfer of Care (Signed)
Immediate Anesthesia Transfer of Care Note  Patient: Peter Thompson  Procedure(s) Performed: RIGHT KNEE ARTHROSCOPY WITH IRRIGATION AND DEBRIDEMENT (Right: Knee)  Patient Location: PACU  Anesthesia Type:Spinal  Level of Consciousness: awake, alert , oriented, and patient cooperative  Airway & Oxygen Therapy: Patient Spontanous Breathing and Patient connected to nasal cannula oxygen  Post-op Assessment: Report given to RN and Post -op Vital signs reviewed and stable  Post vital signs: Reviewed and stable  Last Vitals:  Vitals Value Taken Time  BP 128/75 02/14/23 1502  Temp    Pulse 75 02/14/23 1504  Resp 17 02/14/23 1504  SpO2 99 % 02/14/23 1504  Vitals shown include unvalidated device data.  Last Pain:  Vitals:   02/14/23 1100  TempSrc: Oral  PainSc:       Patients Stated Pain Goal: 0 (02/14/23 0749)  Complications: No notable events documented.

## 2023-02-14 NOTE — Interval H&P Note (Signed)
History and Physical Interval Note:  02/14/2023 1:23 PM  Peter Thompson  has presented today for surgery, with the diagnosis of infected right knee replacement.  The various methods of treatment have been discussed with the patient and family. After consideration of risks, benefits and other options for treatment, the patient has consented to  Procedure(s): RIGHT KNEE ARTHROSCOPY WITH IRRIGATION AND DEBRIDEMENT (Right) as a surgical intervention.  The patient's history has been reviewed, patient examined, no change in status, stable for surgery.  I have reviewed the patient's chart and labs.  Questions were answered to the patient's satisfaction.     Velna Ochs

## 2023-02-14 NOTE — Anesthesia Procedure Notes (Signed)
Spinal  Patient location during procedure: OR Start time: 02/14/2023 1:53 PM End time: 02/14/2023 1:58 PM Reason for block: surgical anesthesia Staffing Performed: anesthesiologist  Anesthesiologist: Gaynelle Adu, MD Performed by: Gaynelle Adu, MD Authorized by: Gaynelle Adu, MD   Preanesthetic Checklist Completed: patient identified, IV checked, risks and benefits discussed, surgical consent, monitors and equipment checked, pre-op evaluation and timeout performed Spinal Block Patient position: sitting Prep: DuraPrep Patient monitoring: cardiac monitor, continuous pulse ox and blood pressure Approach: midline Location: L3-4 Injection technique: single-shot Needle Needle type: Pencan  Needle gauge: 24 G Needle length: 9 cm Assessment Sensory level: T8 Events: CSF return Additional Notes Functioning IV was confirmed and monitors were applied. Sterile prep and drape, including hand hygiene and sterile gloves were used. The patient was positioned and the spine was prepped. The skin was anesthetized with lidocaine.  Free flow of clear CSF was obtained prior to injecting local anesthetic into the CSF.  The spinal needle aspirated freely following injection.  The needle was carefully withdrawn.  The patient tolerated the procedure well.

## 2023-02-14 NOTE — Progress Notes (Signed)
**Note De-Identified vi Obfusction** PROGRESS NOTE    Peter Thompson  ZOX:[id number] DOB: 05-Sep-1966 DOA: 02/10/2023 PCP: Quitmn Livings, MD  Chief Complint  [name] presents with   Knee Pin    Brief Nrrtive:    Peter Thompson is  57 y.o. mle with medicl history significnt of proxysml tril fibrilltion on nticogultion, hypertension, hyperlipidemi, obesity, chronic neck pin on opioids sent to ED from orthopedics office with  prosthetic joint infection of the right knee.  He's lso been found to hve psteurell bcteremi.  Now s/p surgery with orthopedics.  Infectious disese nd orthopedics following.    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  Psteurell Bcteremi:  S/p rthrocentesis, will follow culture results (pending from outside ortho office -> white count 136,900, no crystls, no growth since Fridy s of 6/3 note from ortho) Blood cultures with psteurell multocid in 1/2 sets, BCID negtive Unsyn   S/p R TKR I&D 6/4 Echo from 2023 with EF 60-65% Continue oxycodone 20 mg q4 (home regimen), will dd 5-10 mg oxycodone prn on top Schedule tylenol nd tordol  Eliquis for tril fibrilltion is on hold for now Apprecite ID recs  Atril Fibrilltion Chdvsc is 2 for HTN nd PVD  Eliquis is on hold Continue lbetlol   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  Diffuse therosclerosis on L, no stenosis.  Lst seen by Dr. Bing Mtter 04/2022, t tht time ws on plvix -> he tells me he ws told to discontinue this nd continue eliquis There's  telephone note from 10/2022 which  notes he ws supposed to be tking both plvix nd eliquis  There's  note from 4/25 with crdiology tht mentions continuing current regimen? I briefly discussed the cse with vsculr who noted resonble to d/c plvix    Essentil hypertension: [name] ppers to be on severl medictions including mlodipine, clonidine, hydrlzine, lbetlol, lisinopril nd hydrochlorothizide.  BP's ok right now on only clonidine nd lbetlol Will continue mlodipine, clonidine, lbetlol, lsix  Hold lisinopril, HCTZ, hydrlzine for now since BP resonbly controlled    Hyperlipidemi: Resume Zeti    Obstructive sleep pne: CPAP    Hypoklemi: Replenish.    Chronic hypontremi: Upon chrt review, it sounds like he hs intermittent chronic hypontremi.  He is symptomtic currently.   Some LE edem, he's on lsix, will dd fluid restriction nd follow     DVT prophylxis: SCD Code Sttus: full Fmily Communiction: none Disposition:   Sttus is: Inptient Remins inptient pproprite becuse: need for ortho intervention, ID input   Consultnts:  ID ortho  Procedures:  none  Antimicrobils:  Anti-infectives (From dmission, onwrd)    Strt     Dose/Rte Route Frequency Ordered Stop   02/12/23 1800  Ampicillin-Sulbctm (UNASYN) 3 g in sodium chloride 0.9 % 100 mL IVPB        3 g 200 mL/hr over 30 Minutes Intrvenous Every 6 hours 02/12/23 1651     02/11/23 1600  cefTRIAXone (ROCEPHIN) 2 g in sodium chloride 0.9 % 100 mL IVPB  Sttus:  Discontinued        2 g 200 mL/hr over 30 Minutes Intrvenous Every 24 hours  02/10/23 1522 02/11/23 0917   02/11/23 1000  ceFEPIme (MAXIPIME) 2 g in sodium chloride 0.9 % 100 mL IVPB  Status:  Discontinued        2 g 200 mL/hr over 30 Minutes Intravenous Every 8 hours 02/11/23 0917 02/12/23 1638   02/11/23 0300  vancomycin (VANCOREADY) IVPB 1250 mg/250 mL  Status:  Discontinued        1,250 mg 166.7 mL/hr over 90 Minutes Intravenous Every  12 hours 02/10/23 1538 02/11/23 1514   02/10/23 1530  cefTRIAXone (ROCEPHIN) 1 g in sodium chloride 0.9 % 100 mL IVPB        1 g 200 mL/hr over 30 Minutes Intravenous STAT 02/10/23 1518 02/10/23 1704   02/10/23 1300  vancomycin (VANCOREADY) IVPB 2000 mg/400 mL        2,000 mg 200 mL/hr over 120 Minutes Intravenous  Once 02/10/23 1258 02/10/23 1631   02/10/23 1245  cefTRIAXone (ROCEPHIN) 1 g in sodium chloride 0.9 % 100 mL IVPB        1 g 200 mL/hr over 30 Minutes Intravenous  Once 02/10/23 1234 02/10/23 1432       Subjective: Anxious before surgery  Objective: Vitals:   02/14/23 1502 02/14/23 1515 02/14/23 1530 02/14/23 1545  BP: 128/75 129/70 (!) 143/74 132/78  Pulse: 76 68 72 67  Resp: 17 15 16 15   Temp:    97.6 F (36.4 C)  TempSrc:      SpO2: 97% 99% 100% 99%  Weight:      Height:        Intake/Output Summary (Last 24 hours) at 02/14/2023 1616 Last data filed at 02/14/2023 1545 Gross per 24 hour  Intake 450 ml  Output 2000 ml  Net -1550 ml   Filed Weights   02/10/23 1226 02/12/23 0529 02/14/23 0500  Weight: 113.4 kg 109.7 kg 110.4 kg    Examination:  General: No acute distress. Cardiovascular: RRR Lungs: unlabored Neurological: Alert and oriented 3. Moves all extremities 4 with equal strength. Cranial nerves II through XII grossly intact. Extremities: R knee edema  Data Reviewed: I have personally reviewed following labs and imaging studies  CBC: Recent Labs  Lab 02/10/23 1325 02/11/23 0640 02/12/23 0523 02/13/23 0515 02/14/23 0922  WBC 12.7* 8.7 7.1 6.9 7.3  NEUTROABS  --   --  4.8 4.4  --   HGB 11.8* 12.2* 10.7* 11.2* 11.0*  HCT 34.3* 35.4* 32.2* 32.4* 32.5*  MCV 86.6 87.2 88.7 88.3 89.5  PLT 274 281 288 292 299    Basic Metabolic Panel: Recent Labs  Lab 02/10/23 1325 02/10/23 1517 02/11/23 0640 02/12/23 0523 02/13/23 0515 02/14/23 0922  NA 129*  --  129* 128* 129* 130*  K 3.2*  --  3.9 3.8 3.6 4.0  CL 88*  --  93* 95* 95* 96*  CO2  29  --  24 22 24 23   GLUCOSE 167*  --  108* 129* 123* 109*  BUN 19  --  20 28* 29* 25*  CREATININE 0.78  --  0.75 1.02 0.91 0.66  CALCIUM 9.1  --  9.3 8.8* 8.7* 9.1  MG  --  2.1  --  1.8 1.9 2.1  PHOS  --   --   --  4.7* 5.0* 3.7    GFR: Estimated Creatinine Clearance: 126.8 mL/min (by C-G formula based on SCr of 0.66 mg/dL).  Liver Function Tests: Recent Labs  Lab 02/12/23 0523 02/13/23 0515  AST 36 33  ALT 43 42  LKPHOS 98 103  BILITOT 1.1 1.0  PROT 6.9 7.2  LBUMIN 3.6 3.8    CBG: Recent Labs  Lab 02/10/23 2104  GLUCP 139*     Recent Results (from the past 240 hour(s))  Blood culture (routine x 2)     Status: None (Preliminary result)   Collection Time: 02/10/23  1:25 PM   Specimen: BLOOD LEFT FORERM  Result Value Ref Range Status   Specimen Description   Final    BLOOD LEFT FORERM Performed at [facility], [address] [place], [place] [address]    Special Requests   Final    BOTTLES DRWN EROBIC ND NEROBIC Blood Culture results may not be optimal due to an excessive volume of blood received in culture bottles Performed at [facility], [address], [place], [place] [address]    Culture   Final    NO GROWTH 4 DYS Performed at [facility], [address] [place], [place] [address]    Report Status PENDING  Incomplete  Blood culture (routine x 2)     Status: bnormal   Collection Time: 02/10/23  1:30 PM   Specimen: BLOOD RIGHT FORERM  Result Value Ref Range Status   Specimen Description   Final    BLOOD RIGHT FORERM Performed at [facility], [address], [place], [place] [address]    Special Requests   Final    BOTTLES DRWN EROBIC ND NEROBIC Blood Culture results may not be optimal due to an excessive volume of blood received in culture bottles Performed at [facility], [address], [place], [place] [address]    Culture  Setup Time   Final    GRM NEGTIVE  RODS NEROBIC BOTTLE ONLY CRITICL RESULT CLLED TO, RED BCK BY ND VERIFIED WITH: PHRMD J. GDHI 02/11/23 @ 0731 BY B    Culture ()  Final    PSTEURELL MULTOCID Usually susceptible to penicillin and other beta lactam agents,quinolones,macrolides and tetracyclines. Performed at [facility], [address], [place], [place] [address]    Report Status 02/13/2023 FINL  Final  Blood Culture ID Panel (Reflexed)     Status: None   Collection Time: 02/10/23  1:30 PM  Result Value Ref Range Status   Enterococcus faecalis NOT DETECTED NOT DETECTED Final   Enterococcus Faecium NOT DETECTED NOT DETECTED Final   Listeria monocytogenes NOT DETECTED NOT DETECTED Final   Staphylococcus species NOT DETECTED NOT DETECTED Final   Staphylococcus aureus (BCID) NOT DETECTED NOT DETECTED Final   Staphylococcus epidermidis NOT DETECTED NOT DETECTED Final   Staphylococcus lugdunensis NOT DETECTED NOT DETECTED Final   Streptococcus species NOT DETECTED NOT DETECTED Final   Streptococcus agalactiae NOT DETECTED NOT DETECTED Final   Streptococcus pneumoniae NOT DETECTED NOT DETECTED Final   Streptococcus pyogenes NOT DETECTED NOT DETECTED Final   .calcoaceticus-baumannii NOT DETECTED NOT DETECTED Final   Bacteroides fragilis NOT DETECTED NOT DETECTED Final   Enterobacterales NOT DETECTED NOT DETECTED Final   Enterobacter cloacae complex NOT DETECTED NOT DETECTED Final   Escherichia coli NOT DETECTED NOT DETECTED Final   Klebsiella aerogenes NOT DETECTED NOT DETECTED Final   Klebsiella oxytoca NOT DETECTED NOT DETECTED Final   Klebsiella pneumoniae NOT DETECTED NOT DETECTED Final   Proteus species NOT DETECTED NOT DETECTED Final   Salmonella species NOT DETECTED NOT DETECTED Final   Serratia marcescens NOT DETECTED NOT DETECTED Final   Haemophilus influenzae NOT DETECTED NOT **Note De-Identified vi Obfusction** DETECTED Finl   Neisseri meningitidis NOT DETECTED NOT DETECTED Finl   Pseudomons eruginos NOT DETECTED NOT  DETECTED Finl   Stenotrophomons mltophili NOT DETECTED NOT DETECTED Finl   Cndid lbicns NOT DETECTED NOT DETECTED Finl   Cndid uris NOT DETECTED NOT DETECTED Finl   Cndid glbrt NOT DETECTED NOT DETECTED Finl   Cndid krusei NOT DETECTED NOT DETECTED Finl   Cndid prpsilosis NOT DETECTED NOT DETECTED Finl   Cndid tropiclis NOT DETECTED NOT DETECTED Finl   Cryptococcus neoformns/gttii NOT DETECTED NOT DETECTED Finl    Comment: Performed t [facility], [address] [place], [place] [address]  Surgicl pcr screen     Sttus: None   Collection Time: 02/13/23 10:36 PM   Specimen: Nsl Mucos; Nsl Swb  Result Vlue Ref Rnge Sttus   MRSA, PCR NEGATIVE NEGATIVE Finl   Stphylococcus ureus NEGATIVE NEGATIVE Finl    Comment: (NOTE) The Xpert SA Assy (FDA pproved for NASAL specimens in ptients 53 yers of ge nd older), is one component of  comprehensive surveillnce progrm. It is not intended to dignose infection nor to guide or monitor tretment. Performed t [facility], [address] [place], [place] [address]          Rdiology Studies: No results found.      Scheduled Meds:  ALPRAZolm  1 mg Orl QHS   mLODipine  10 mg Orl Dily   cloNIDine  0.2 mg Orl TID   docuste sodium  100 mg Orl BID   fentNYL       [START ON 02/15/2023] fluticsone furote-vilnterol  1 puff Inhltion Dily   furosemide  80 mg Orl Dily   ketorolc  15 mg Intrvenous Q6H   lbetlol  300 mg Orl BID   multivitmin with minerls  1 tblet Orl Dily   oxychlorosene   Irrigtion Once   oxyCODONE  20 mg Orl Q4H   pntoprzole  40 mg Orl Dily   polyethylene glycol  17 g Orl BID   Continuous Infusions:  mpicillin-sulbctm (UNASYN) IV 3 g (02/14/23 0602)     LOS: 4 dys    Time spent: over 30 min    Lcreti Nicks, MD [facility]   To contct the ttending provider between 7A-7P or the  covering provider during fter hours 7P-7A, plese log into the web site www.mion.com nd ccess using [facility] [place] pssword for tht web site. If you do not hve the pssword, plese cll the hospitl opertor.  02/14/2023, 4:16 PM

## 2023-02-14 NOTE — Op Note (Unsigned)
NAME: Peter Thompson, PEACHES MEDICAL RECORD NO: [id number] ACCOUNT NO: [id number] DATE OF BIRTH: 03-Nov-1965 FACILITY: [facility] LOCATION: [facility] PHYSICIAN: Lubertha Basque. Jerl Santos, MD  Operative Report   DATE OF PROCEDURE: 02/14/2023  PREOPERATIVE DIAGNOSIS: Infected right total knee replacement.  POSTOPERATIVE DIAGNOSIS:  Infected right total knee replacement.  PROCEDURE: Right knee irrigation and debridement.  ANESTHESIA:  Spinal and sedation.  ATTENDING SURGEON:  Lubertha Basque. Jerl Santos, MD.  ASSISTANT:  Elodia Florence, PA.  INDICATIONS:  The patient is a 57 year old man about 4 years out from a right knee replacement.  He has had significant pain over the last week or so.  He presented to the office on aspiration was significant for greater than 100,000 white cells, but no  organisms.  He was admitted to the hospital for IV antibiotics and has stabilized somewhat.  After great discussion and consideration of all the available options we have elected together to go forward with a simple arthroscopic irrigation and  debridement.  He and his wife understand that this might not be the last procedure if we cannot control what may be an infection.  Informed operative consent was obtained after discussion of possible complications including reaction to anesthesia,  infection, and obviously continued infection.  SUMMARY OF FINDINGS AND PROCEDURE:  Under spinal anesthesia and sedation, a right knee arthroscopy was performed.  He did have some cloudy fluid, which we again evacuated from the knee and sent again for cell count, crystal study, and cultures.  We then  irrigated with 6 liters of saline and 500 mL of Prontosan.  I placed 2 Hemovac drain limbs through the portals and then placed a sterile dressing and he was admitted back to his hospital room.  DESCRIPTION OF PROCEDURE:  The patient was taken to the operating suite where spinal anesthetic was applied without difficulty.  He was also given some sedation.   He was positioned supine and prepped and draped in normal sterile fashion.  After the  administration of his baseline IV antibiotic and appropriate time-out an arthroscopy of the right knee was performed through total of 2 portals.  He had some inflamed synovium and a mild amount of cloudy fluid, which we evacuated and sent again to the  lab for studies.  I then irrigated with the above agents.  We placed the 2 limbs of Hemovac drain and sutured these into place.  I then placed Xeroform followed by dry gauze and an Ace wrap.  Estimated blood loss and intraoperative fluids can be obtained  from anesthesia records.  DISPOSITION: The patient was taken to recovery room in stable condition.  Plans were for him to be admitted back to his medicine service and to continue on his antibiotic with an infectious disease consult.   PUS D: 02/14/2023 2:56:20 pm T: 02/14/2023 3:35:00 pm  JOB: [id number]/ [id number]

## 2023-02-14 NOTE — Anesthesia Postprocedure Evaluation (Signed)
Anesthesia Post Note  Patient: Peter Thompson  Procedure(s) Performed: RIGHT KNEE ARTHROSCOPY WITH IRRIGATION AND DEBRIDEMENT (Right: Knee)     Patient location during evaluation: PACU Anesthesia Type: General Level of consciousness: oriented and awake and alert Pain management: pain level controlled Vital Signs Assessment: post-procedure vital signs reviewed and stable Respiratory status: spontaneous breathing, respiratory function stable and patient connected to nasal cannula oxygen Cardiovascular status: blood pressure returned to baseline and stable Postop Assessment: no headache, no backache, no apparent nausea or vomiting, spinal receding and patient able to bend at knees Anesthetic complications: no  No notable events documented.  Last Vitals:  Vitals:   02/14/23 1530 02/14/23 1545  BP: (!) 143/74 132/78  Pulse: 72 67  Resp: 16 15  Temp:  36.4 C  SpO2: 100% 99%    Last Pain:  Vitals:   02/14/23 1530  TempSrc:   PainSc: 9                  ,W. EDMOND

## 2023-02-14 NOTE — Anesthesia Procedure Notes (Signed)
Procedure Name: MAC Date/Time: 02/14/2023 1:53 PM  Performed by: Elyn Peers, CRNAPre-anesthesia Checklist: Patient identified, Emergency Drugs available, Suction available, Patient being monitored and Timeout performed Oxygen Delivery Method: Nasal cannula Placement Confirmation: positive ETCO2

## 2023-02-14 NOTE — Evaluation (Signed)
Physical Therapy Evaluation Patient Details Name: Peter Thompson MRN: [id number] DOB: 1965/12/02 Today's Date: 02/14/2023  History of Present Illness  57 yo male presents to therapy following recent hospital admission on 02/10/2023 due to R knee pain and edema. 5/31 OP orthopedic, Dr. Jerl Santos and under went R knee arthrocentesis was purulent and pt instructed to come to ED. 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 Infectious dz and orthopedics continue to follow pt acutely. Pt is currently R LE WBAT. Pt PMH includes but is not limited to: c-spine sx and chronic pain, PAF and on anticoagulant therapy, HTN, HDL, LBP, OSA, multiple orthopedic sx-- R UE s/p fx, R wrist, L ankle, L hip, L shoulder, L THA, (2017) and R TKA (2020).  Clinical Impression    Pt admitted with above diagnosis.  Pt currently with functional limitations due to the deficits listed below (see PT Problem List). Pt in bed when PT arrived. Pt reports the worst pain ever, even worse that his R TKA. Pt states several orthopedic sx and knows what he needs to do and would like to address pain and go home. Pt ed provided on importance of PT to assess pt safety and IND with functional mobility and provide education prior to d/c. Pt agreeable to sitting EOB with min guard and cues for safety. PT set up for STS from EOB with use of crutches pt indicated he did not want to do something that would knowingly make his R knee pain worse. Pt elected to return to bed, all needs in place. Nurse made aware of pt pain report.  Pt will benefit from acute skilled PT to increase their independence and safety with mobility to allow discharge.        Recommendations for follow up therapy are one component of a multi-disciplinary discharge planning process, led by the attending physician.  Recommendations may be updated based on patient status, additional functional  criteria and insurance authorization.  Follow Up Recommendations       Assistance Recommended at Discharge Intermittent Supervision/Assistance  Patient can return home with the following  A lot of help with walking and/or transfers;A lot of help with bathing/dressing/bathroom;Assistance with cooking/housework;Assist for transportation;Help with stairs or ramp for entrance    Equipment Recommendations Other (comment) (TBD- RW pending safety and stabilty with crutches)  Recommendations for Other Services       Functional Status Assessment Patient has had a recent decline in their functional status and demonstrates the ability to make significant improvements in function in a reasonable and predictable amount of time.     Precautions / Restrictions Precautions Precautions: Fall;Other (comment) (R LE hemovac drain) Restrictions Weight Bearing Restrictions: No      Mobility  Bed Mobility Overal bed mobility: Needs Assistance Bed Mobility: Supine to Sit, Sit to Supine     Supine to sit: Min guard Sit to supine: Min guard   General bed mobility comments: HOB elevated for supine to sit, cues for safe and slow movements and encouragement to scoot anteriorly to palce R LE on floor. pt able to scoot seated EOB to the L toward [facility] to reposition for sit to supine    Transfers                   General transfer comment: pt declined reporting he did not want to do something he knew was going to make is pain worse.  Ambulation/Gait               General Gait Details: NT  Stairs            Wheelchair Mobility    Modified Rankin (Stroke Patients Only)       Balance Overall balance assessment: Needs assistance Sitting-balance support: Feet supported Sitting balance-Leahy Scale: Good                                       Pertinent Vitals/Pain Pain Assessment Pain Assessment: 0-10 Pain Score: 10-Worst pain ever (pain medication  administered 1 hr prior to PT eval) Pain Location: R LE/knee Pain Descriptors / Indicators: Constant, Discomfort, Operative site guarding Pain Intervention(s): Limited activity within patient's tolerance, Monitored during session, Premedicated before session, Repositioned    Home Living Family/patient expects to be discharged to:: Private residence Living Arrangements: Spouse/significant other Available Help at Discharge: Family Type of Home: Mobile home Home Access: Stairs to enter Entrance Stairs-Rails: Right;Left;Can reach both Entrance Stairs-Number of Steps: 4   Home Layout: One level Home Equipment: Crutches      Prior Function Prior Level of Function : Independent/Modified Independent             Mobility Comments: pt reports required use of crutches for about the past week prior to that no AD, mod I for all ADLs, self care tasks, IADls.       Hand Dominance        Extremity/Trunk Assessment        Lower Extremity Assessment Lower Extremity Assessment: RLE deficits/detail RLE Deficits / Details: ankle DF/PF 4/5 RLE Sensation: WNL    Cervical / Trunk Assessment Cervical / Trunk Assessment: Neck Surgery;Back Surgery  Communication   Communication: HOH (B hearing aids)  Cognition Arousal/Alertness: Awake/alert Behavior During Therapy: WFL for tasks assessed/performed Overall Cognitive Status: Within Functional Limits for tasks assessed                                          General Comments General comments (skin integrity, edema, etc.): B LE discoloration, distal B LE edema    Exercises     Assessment/Plan    PT Assessment Patient needs continued PT services  PT Problem List Decreased strength;Decreased range of motion;Decreased activity tolerance;Decreased balance;Decreased mobility;Decreased coordination;Pain       PT Treatment Interventions DME instruction;Gait training;Stair training;Functional mobility  training;Therapeutic activities;Therapeutic exercise;Balance training;Neuromuscular re-education;Patient/family education;Modalities    PT Goals (Current goals can be found in the Care Plan section)  Acute Rehab PT Goals Patient Stated Goal: to go home and not have R knee pain PT Goal Formulation: With patient Time For Goal Achievement: 02/28/23 Potential to Achieve Goals: Good    Frequency Min 1X/week     Co-evaluation               AM-PAC PT "6 Clicks" Mobility  Outcome Measure Help needed turning from your back to your side while in a flat bed without using bedrails?: None Help needed moving from lying on your back to sitting on the side of a flat bed without using bedrails?: A Little Help needed moving to and from a bed to a chair (including a wheelchair)?: A Lot Help needed standing up from a chair using your arms (e.g., wheelchair or bedside chair)?: Total  Help needed to walk in hospital room?: Total Help needed climbing 3-5 steps with a railing? : Total 6 Click Score: 12    End of Session   Activity Tolerance: Patient limited by pain Patient left: in chair;with call bell/phone within reach Nurse Communication: Mobility status;Patient requests pain meds PT Visit Diagnosis: Unsteadiness on feet (R26.81);Other abnormalities of gait and mobility (R26.89);Muscle weakness (generalized) (M62.81);Difficulty in walking, not elsewhere classified (R26.2);Pain Pain - Right/Left: Right Pain - part of body: Knee;Leg    Time: 1730-1756 PT Time Calculation (min) (ACUTE ONLY): 26 min   Charges:   PT Evaluation $PT Eval Low Complexity: 1 Low PT Treatments $Therapeutic Activity: 8-22 mins        Johnny Bridge, PT Acute Rehab   Jacqualyn Posey 02/14/2023, 7:03 PM

## 2023-02-14 NOTE — Brief Op Note (Signed)
XADEN RITZMAN [id number] 02/14/2023   PRE-OP DIAGNOSIS: right TKR infection  POST-OP DIAGNOSIS: same  PROCEDURE: right TKR I&D  ANESTHESIA: spinal and MAC  Velna Ochs   Dictation #:  [id number]

## 2023-02-14 NOTE — Anesthesia Preprocedure Evaluation (Addendum)
Anesthesia Evaluation  Patient identified by MRN, date of birth, ID band Patient awake    Reviewed: Allergy & Precautions, H&P , NPO status , Patient's Chart, lab work & pertinent test results  Airway Mallampati: III  TM Distance: >3 FB Neck ROM: Full    Dental no notable dental hx. (+) Teeth Intact, Dental Advisory Given   Pulmonary sleep apnea , former smoker   Pulmonary exam normal breath sounds clear to auscultation       Cardiovascular hypertension, Pt. on medications + Peripheral Vascular Disease  + dysrhythmias Atrial Fibrillation  Rhythm:Regular Rate:Normal     Neuro/Psych  Headaches  Anxiety        GI/Hepatic negative GI ROS, Neg liver ROS,,,  Endo/Other  negative endocrine ROS    Renal/GU negative Renal ROS  negative genitourinary   Musculoskeletal  (+) Arthritis , Osteoarthritis,    Abdominal   Peds  Hematology  (+) Blood dyscrasia, anemia   Anesthesia Other Findings   Reproductive/Obstetrics negative OB ROS                             Anesthesia Physical Anesthesia Plan  ASA: 3  Anesthesia Plan: Spinal   Post-op Pain Management: Tylenol PO (pre-op)* and Precedex   Induction: Intravenous  PONV Risk Score and Plan: 3 and Ondansetron, Dexamethasone and Propofol infusion  Airway Management Planned: Natural Airway and Simple Face Mask  Additional Equipment:   Intra-op Plan:   Post-operative Plan:   Informed Consent: I have reviewed the patients History and Physical, chart, labs and discussed the procedure including the risks, benefits and alternatives for the proposed anesthesia with the patient or authorized representative who has indicated his/her understanding and acceptance.     Dental advisory given  Plan Discussed with: CRNA  Anesthesia Plan Comments:        Anesthesia Quick Evaluation

## 2023-02-14 NOTE — TOC Progression Note (Signed)
Transition of Care [facility]) - Progression Note    Patient Details  Name: LEMON VIEW MRN: [id number] Date of Birth: 11/26/1965  [facility] CM/SW Contact  Huston Foley Jacklynn Ganong, RN Phone Number: 02/14/2023, 10:16 AM  Clinical Narrative:    Case manager spoke with Lorenza Chick concerning need for [facility], for IV antibiotics. He has accepted patient. Will update Jeri Modena with [employer]. TOC will continue to follow.   Expected Discharge Plan: Home w Home Health Services Barriers to Discharge: Continued Medical Work up  Expected Discharge Plan and Services In-house Referral: NA Discharge Planning Services: CM Consult Post Acute Care Choice: Home Health Living arrangements for the past 2 months: Single Family Home                 DME Arranged: N/A DME Agency: NA       HH Arranged: RN HH AgencyHotel manager Home Health Care Date HH Agency Contacted: 02/14/23 Time HH Agency Contacted: 0930 Representative spoke with at [facility] Agency: Lorenza Chick   Social Determinants of Health (SDOH) Interventions SDOH Screenings   Food Insecurity: No Food Insecurity (02/11/2023)  Housing: Low Risk  (02/11/2023)  Transportation Needs: No Transportation Needs (02/11/2023)  Utilities: Not At Risk (02/11/2023)  Tobacco Use: Medium Risk (02/10/2023)    Readmission Risk Interventions    02/13/2023    9:41 AM  Readmission Risk Prevention Plan  Post Dischage Appt Complete  Medication Screening Complete  Transportation Screening Complete

## 2023-02-15 ENCOUNTER — Other Ambulatory Visit: Payer: Self-pay

## 2023-02-15 ENCOUNTER — Inpatient Hospital Stay (HOSPITAL_COMMUNITY): Payer: Medicaid Other

## 2023-02-15 ENCOUNTER — Encounter (HOSPITAL_COMMUNITY): Payer: Self-pay | Admitting: Orthopaedic Surgery

## 2023-02-15 DIAGNOSIS — M00061 Staphylococcal arthritis, right knee: Secondary | ICD-10-CM

## 2023-02-15 DIAGNOSIS — T8453XA Infection and inflammatory reaction due to internal right knee prosthesis, initial encounter: Secondary | ICD-10-CM | POA: Diagnosis not present

## 2023-02-15 DIAGNOSIS — M00861 Arthritis due to other bacteria, right knee: Secondary | ICD-10-CM | POA: Diagnosis not present

## 2023-02-15 LAB — AEROBIC/ANAEROBIC CULTURE W GRAM STAIN (SURGICAL/DEEP WOUND)

## 2023-02-15 LAB — RAPID URINE DRUG SCREEN, HOSP PERFORMED
Amphetamines: NOT DETECTED
Barbiturates: NOT DETECTED
Benzodiazepines: POSITIVE — AB
Cocaine: NOT DETECTED
Opiates: POSITIVE — AB
Tetrahydrocannabinol: POSITIVE — AB

## 2023-02-15 MED ORDER — SODIUM CHLORIDE 0.9% FLUSH: 10.0000 mL | Freq: Two times a day (BID) | INTRAVENOUS | Status: DC

## 2023-02-15 MED ORDER — SODIUM CHLORIDE 0.9 % IV SOLN
2.0000 g | INTRAVENOUS | Status: DC
  Administered 2023-02-15: 2 g via INTRAVENOUS
  Filled 2023-02-15: qty 20

## 2023-02-15 MED ORDER — CEFTRIAXONE IV (FOR PTA / DISCHARGE USE ONLY)
2.0000 g | INTRAVENOUS | 0 refills | Status: AC
Start: 2023-02-15 — End: 2023-03-28

## 2023-02-15 MED ORDER — SODIUM CHLORIDE 0.9% FLUSH: 10.0000 mL | INTRAVENOUS | Status: DC | PRN

## 2023-02-15 MED ORDER — CHLORHEXIDINE GLUCONATE CLOTH 2 % EX PADS: 6.0000 | MEDICATED_PAD | Freq: Every day | CUTANEOUS | Status: DC

## 2023-02-15 MED ORDER — PANTOPRAZOLE SODIUM 40 MG PO TBEC: 40.0000 mg | DELAYED_RELEASE_TABLET | Freq: Every day | ORAL | 1 refills | Status: DC

## 2023-02-15 MED ORDER — HEPARIN SOD (PORK) LOCK FLUSH 100 UNIT/ML IV SOLN
250.0000 [IU] | INTRAVENOUS | Status: AC | PRN
  Administered 2023-02-15: 250 [IU]

## 2023-02-15 MED ORDER — APIXABAN 5 MG PO TABS: 5.0000 mg | ORAL_TABLET | Freq: Two times a day (BID) | ORAL | Status: DC

## 2023-02-15 MED ORDER — ENSURE SURGERY PO LIQD
237.0000 mL | Freq: Two times a day (BID) | ORAL | Status: DC
  Administered 2023-02-15 (×2): 237 mL via ORAL
  Filled 2023-02-15 (×2): qty 237

## 2023-02-15 NOTE — Progress Notes (Signed)
Discharge instructions reviewed with pt.  IV's removed.  Pt taken to discharge area via wheelchair.

## 2023-02-15 NOTE — Progress Notes (Signed)
   02/14/23 2200  BiPAP/CPAP/SIPAP  BiPAP/CPAP/SIPAP Pt Type Adult (Pt. seen, able to place on independantly, aware to notify if needed.)  BiPAP/CPAP/SIPAP Resmed  Mask Type Full face mask (Home mask)  Mask Size Large  EPAP 16 cmH2O (Home unit/setting)  FiO2 (%) 21 %  Flow Rate 0 lpm  Patient Home Equipment No (Only home FFMask/Lg.)  Auto Titrate No  CPAP/SIPAP surface wiped down Yes

## 2023-02-15 NOTE — Progress Notes (Addendum)
Subjective: 1 Day Post-Op Procedure(s) (LRB): RIGHT KNEE ARTHROSCOPY WITH IRRIGATION AND DEBRIDEMENT (Right)  Patient doing well this morning. He is walking well and wants to go home today if possible.   Activity level:  wbat Diet tolerance:  ok Voiding:  ok Patient reports pain as mild and moderate.    Objective: Vital signs in last 24 hours: Temp:  [97.5 F (36.4 C)-98.2 F (36.8 C)] 97.8 F (36.6 C) (06/05 0529) Pulse Rate:  [67-89] 78 (06/05 0529) Resp:  [15-21] 16 (06/05 0529) BP: (128-156)/(70-95) 149/76 (06/05 0529) SpO2:  [97 %-100 %] 98 % (06/05 0529) FiO2 (%):  [21 %] 21 % (06/04 2200) Weight:  [107.7 kg] 107.7 kg (06/05 0619)  Labs: Recent Labs    02/13/23 0515 02/14/23 0922  HGB 11.2* 11.0*   Recent Labs    02/13/23 0515 02/14/23 0922  WBC 6.9 7.3  RBC 3.67* 3.63*  HCT 32.4* 32.5*  PLT 292 299   Recent Labs    02/13/23 0515 02/14/23 0922  NA 129* 130*  K 3.6 4.0  CL 95* 96*  CO2 24 23  BUN 29* 25*  CREATININE 0.91 0.66  GLUCOSE 123* 109*  CALCIUM 8.7* 9.1   No results for input(s): "LABPT", "INR" in the last 72 hours.  Physical Exam:  Neurologically intact ABD soft Neurovascular intact Sensation intact distally Intact pulses distally Dorsiflexion/Plantar flexion intact Incision: dressing C/D/I and minimal output from his drain. No cellulitis present Compartment soft  Assessment/Plan:  1 Day Post-Op Procedure(s) (LRB): RIGHT KNEE ARTHROSCOPY WITH IRRIGATION AND DEBRIDEMENT (Right) Advance diet Up with therapy There is currently no growth from his synovial fluid taken during surgery yesterday. However he is currently on IV antibiotics. He is hoping to go home today and from an orthopaedic standpoint that is fine as long as the ID team and medicine team think it is ok. I informed him that he would still likely need a PICC line placed for IV antibiotics before discharge but we will leave that up to the ID team. We greatly appreciate  medical and ID management. His drain currently has minimal output. If he goes home today or tomorrow we will see him in the office to take it out. If he is still here in the morning and has less than 30ml per 8hr shift from the drain then I will pull it tomorrow.  He is also asking from a slight increase in his pain medication as he is on his baseline meds which seems reasonable to me. I will reach out to his PCP who is his pain management prescriber to see about increasing his dose when he goes home. It is also ok to resume home eliquis today.   Ginger Organ  02/15/2023, 8:16 AM

## 2023-02-15 NOTE — Progress Notes (Signed)
PHARMACY CONSULT NOTE FOR:  OUTPATIENT  PARENTERAL ANTIBIOTIC THERAPY (OPAT)  Indication: Right knee PJI  Regimen: Ceftriaxone 2 gm IV Q 24 hours  End date: 03/28/23  IV antibiotic discharge orders are pended. To discharging provider:  please sign these orders via discharge navigator,  Select New Orders & click on the button choice - Manage This Unsigned Work.     Thank you for allowing pharmacy to be a part of this patient's care.  Sharin Mons, PharmD, BCPS, BCIDP Infectious Diseases Clinical Pharmacist Phone: [phone] 02/15/2023, 9:11 AM

## 2023-02-15 NOTE — Hospital Course (Signed)
57yo male with h/o afib on Eliquis, HTN, HLD, obesity, and chronic neck pain on opioids who presented on 5/31 with prosthetic knee infection and Pasteurella bacteremia.  He underwent I&D with washout on 6/4 and is being treated with Unasyn.

## 2023-02-15 NOTE — TOC Transition Note (Signed)
Transition of Care [facility]) - CM/SW Discharge Note   Patient Details  Name: Peter Thompson MRN: [id number] Date of Birth: 1966/06/18  Transition of Care [facility]) CM/SW Contact:  Otelia Santee, LCSW Phone Number: 02/15/2023, 12:58 PM   Clinical Narrative:    Pt is to return home with IV abx through 03/28/2023. Pam w/ [facility] has provided education to pt and has orded IV abx and equipment for pt to receive once home. HHRN has been arranged with [facility] for IV abx.    Final next level of care: Home w Home Health Services Barriers to Discharge: Barriers Resolved   Patient Goals and CMS Choice CMS Medicare.gov Compare Post Acute Care list provided to:: Patient Choice offered to / list presented to : Patient  Discharge Placement                         Discharge Plan and Services Additional resources added to the After Visit Summary for   In-house Referral: NA Discharge Planning Services: CM Consult Post Acute Care Choice: Home Health          DME Arranged: N/A DME Agency: NA       HH Arranged: RN HH Agency: [facility] Date [facility] Agency Contacted: 02/14/23 Time HH Agency Contacted: 0930 Representative spoke with at [facility] Agency: Lorenza Chick  Social Determinants of Health (SDOH) Interventions SDOH Screenings   Food Insecurity: No Food Insecurity (02/11/2023)  Housing: Low Risk  (02/11/2023)  Transportation Needs: No Transportation Needs (02/11/2023)  Utilities: Not At Risk (02/11/2023)  Tobacco Use: Medium Risk (02/15/2023)     Readmission Risk Interventions    02/15/2023   12:58 PM 02/13/2023    9:41 AM  Readmission Risk Prevention Plan  Post Dischage Appt  Complete  Medication Screening  Complete  Transportation Screening Complete Complete  PCP or Specialist Appt within 5-7 Days Complete   Home Care Screening Complete   Medication Review (RN CM) Complete

## 2023-02-15 NOTE — Progress Notes (Signed)
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 No concern for infection involvement at this time there  As mentioned previously clinic aspirate of right knee didn't grow (last update 6/4)   6/4 operative cx in progress  OPAT Orders Discharge antibiotics to be given via PICC line Discharge antibiotics: Ceftriaxone 2 gram iv daily  Duration: 6 weeks for iv ceftriaxone from 6/04; after that will plan to do 3 months PO cefadroxil 1 gram bid   End Date: OPAT Order Details             .Outpatient Parenteral Antibiotic Therapy Consult  Until discontinued       Provider:  (Not yet assigned)  Question Answer Comment  Antibiotic Ceftriaxone (Rocephin) IVPB   Indications for use Right knee PJI   End Date 03/28/2023   Approving ID Provider's Name                   [facility] Care Per Protocol:  Home health RN for IV administration and teaching; PICC line care and labs.    Labs weekly while on IV antibiotics: _x_ CBC with differential __ BMP _x_ CMP _x_ CRP __ ESR __ Vancomycin trough __ CK  _x_ Please pull PIC at completion of IV antibiotics __ Please leave PIC in place until doctor has seen patient or been notified  Fax weekly labs to [phone]  Clinic Follow Up Appt: 7/01 @ 1030  @  [facility] [address], [place], [place] [address] Phone: [phone]   --------------- Subjective Doing well with right knee pain; underwent I&D right knee only on 6/4 no polyexchange Afebrile   Objective: Vitals:   02/15/23 0529 02/15/23 0919  BP: (!) 149/76   Pulse: 78   Resp: 16   Temp: 97.8 F (36.6 C)   SpO2: 98% 98%   General/constitutional: no distress, pleasant HEENT: Normocephalic, PER, Conj Clear, EOMI, Oropharynx clear Neck supple CV: rrr no mrg Lungs: clear to auscultation, normal respiratory effort Abd: Soft, Nontender Ext: trace bilateral le  edema Skin: hyperpigmentation bilateral LE from chronic venous stasis Neuro: nonfocal MSK: right knee dressing c/d/I    Labs: Lab Results  Component Value Date   WBC 7.3 02/14/2023   HGB 11.0 (L) 02/14/2023   HCT 32.5 (L) 02/14/2023   MCV 89.5 02/14/2023   PLT 299 02/14/2023   Last metabolic panel Lab Results  Component Value Date   GLUCOSE 109 (H) 02/14/2023   NA 130 (L) 02/14/2023   K 4.0 02/14/2023   CL 96 (L) 02/14/2023   CO2 23 02/14/2023   BUN 25 (H) 02/14/2023   CREATININE 0.66 02/14/2023   GFRNONAA >60 02/14/2023   CALCIUM 9.1 02/14/2023   PHOS 3.7 02/14/2023   PROT 7.2 02/13/2023   ALBUMIN 3.8 02/13/2023   BILITOT 1.0 02/13/2023   ALKPHOS 103 02/13/2023   AST 33 02/13/2023   ALT 42 02/13/2023   ANIONGAP 11 02/14/2023   Lab Results  Component Value Date   CRP 10.6 (H) 02/12/2023        I spent more than 50 minute reviewing data/chart, and coordinating care, providing direct face to face time providing counseling/discussing diagnostics/treatment plan with patient and treatment team

## 2023-02-15 NOTE — Discharge Summary (Signed)
Physician Discharge Summary   Patient: Peter Thompson MRN: [id number] DOB: 08/03/1966  Admit date:     02/10/2023  Discharge date:   Discharge Physician: Jonah Blue   PCP: Quitman Livings, MD   Recommendations at discharge:   Keep PICC line clean and use only as directed Use IV antibiotics as directed by Infectious Diease with assistance from home health nursing Follow up with orthopedics as directed; keep wound vac in place until then Follow up with Infectious Disease as directed Follow up with PCP in 1-2 weeks Take home pain medications as needed; you are not being given additional pain medications at this time Stop Plavix, continue Eliquis Hold lisinopril, HCTZ, and hydralazine for now; continue other blood pressure medications and follow up with PCP to have others added back as needed. You have been referred for lung cancer screening based on your prior smoking history.  Discharge Diagnoses: Principal Problem:   Septic arthritis of knee (HCC) Active Problems:   Essential (primary) hypertension   Paroxysmal atrial fibrillation (HCC)   Obesity   Obstructive sleep apnea   Infected prosthetic knee joint (HCC)   Gram-negative bacteremia   Peripheral vascular disease [facility])    Hospital Course: 57yo male with h/o afib on Eliquis, HTN, HLD, obesity, and chronic neck pain on opioids who presented on 5/31 with prosthetic knee infection and Pasteurella bacteremia.  He underwent I&D with washout on 6/4 and is being treated with Unasyn.    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  Pasteurella Bacteremia -Patient underwent arthrocentesis at outside ortho office prior to admission -> white count 136,900, no crystals, no growth as of 6/4 -Blood cultures with pasteurella multocida in 1/2 sets, BCID negative -S/p R TKR I&D 6/4 -Continue oxycodone 20 mg q4 (home regimen) -ID has been following and recommends 6 weeks IV ceftriaxone from 6/4 and then 3  months of cefadroxil 1 gram BID to follow -ID will continue to follow as outpatient -ID has ordered PICC line -Patient is appropriate for dc to home post-PICC -Will order UDS prior to dc (patient reports intermittent ETOH use and denies drugs or smoking)   Atrial Fibrillation -Chadvasc is 2 for HTN and PVD  -Eliquis resumed at time of discharge -Continue labetalol for rate control   Peripheral Vascular Disease -30-49% stenosis in right iliac segment, total occlusion in superficial femoral artery, diffuse atherosclerosis.  Diffuse atherosclerosis on L, no stenosis.  -Last seen by Dr. Bing Matter 04/2022, at that time was on plavix but was later told to discontinue Plavix and continue eliquis -Prior hospitalist discussed the case with vascular who noted reasonable to d/c plavix    Essential hypertension -Patient admitted on several medications including amlodipine, clonidine, hydralazine, labetalol, lisinopril and hydrochlorothiazide.  -Continue amlodipine, clonidine, labetalol, lasix  -Hold lisinopril, HCTZ, hydralazine for now since BP reasonably controlled; these can be resumed as an outpatient if needed    Hyperlipidemia -No longer taking Zetia  -He is likely to benefit from statin therapy; will defer to PCP   Obstructive sleep apnea -Continue CPAP    Chronic hyponatremia -Upon chart review, it sounds like he has intermittent chronic hyponatremia.   -Asymptomatic throughout hospitalization   -Stable   Chronic pain -I have reviewed this patient in the [place] Controlled Substances Reporting System.  He is receiving medications from multiple providers. -He IS at particularly high risk of opioid misuse, diversion, or overdose.  -Additional controlled substances will not be provided at the time of discharge. -UDS  ordered -Needs close outpatient f/u - particularly since he will have a PICC line in place   COPD -Continue home meds - Symbicort, Albuterol -No longer smoking    Obesity -Body mass index is 34.08 kg/m..  -Weight loss should be encouraged -Outpatient PCP/bariatric medicine f/u encouraged       Pain control - [facility] Controlled Substance Reporting System database was reviewed. and patient was instructed, not to drive, operate heavy machinery, perform activities at heights, swimming or participation in water activities or provide baby-sitting services while on Pain, Sleep and Anxiety Medications; until their outpatient Physician has advised to do so again. Also recommended to not to take more than prescribed Pain, Sleep and Anxiety Medications.  Consultants: ID; orthopedics; PT; nutrition; RT Procedures performed: I&D  Disposition: Home Diet recommendation:  Regular diet DISCHARGE MEDICATION: Allergies as of 02/15/2023   No Known Allergies      Medication List     STOP taking these medications    clopidogrel 75 MG tablet Commonly known as: PLAVIX   ezetimibe 10 MG tablet Commonly known as: ZETIA   hydrALAZINE 100 MG tablet Commonly known as: APRESOLINE   lisinopril-hydrochlorothiazide 20-25 MG tablet Commonly known as: ZESTORETIC   potassium chloride 10 MEQ tablet Commonly known as: KLOR-CON   predniSONE 5 MG tablet Commonly known as: DELTASONE       TAKE these medications    ALPRAZolam 0.5 MG tablet Commonly known as: XANAX Take 1 mg by mouth at bedtime.   amLODipine 10 MG tablet Commonly known as: NORVASC Take 10 mg by mouth daily.   apixaban 5 MG Tabs tablet Commonly known as: Eliquis Take 1 tablet (5 mg total) by mouth 2 (two) times daily.   budesonide-formoterol 160-4.5 MCG/ACT inhaler Commonly known as: SYMBICORT Inhale 2 puffs into the lungs 2 (two) times daily as needed (for flares).   cefTRIAXone  IVPB Commonly known as: ROCEPHIN Inject 2 g into the vein daily. Indication:  Right Knee PJI  First Dose: Yes Last Day of Therapy:  03/28/23 Labs - Once weekly:  CBC/D and BMP, Labs - Once weekly:  ESR and CRP Method of administration: IV Push Method of administration may be changed at the discretion of home infusion pharmacist based upon assessment of the patient and/or caregiver's ability to self-administer the medication ordered.   cloNIDine 0.2 MG tablet Commonly known as: CATAPRES Take 0.2 mg by mouth 3 (three) times daily.   furosemide 80 MG tablet Commonly known as: LASIX Take 80 mg by mouth in the morning.   labetalol 300 MG tablet Commonly known as: NORMODYNE Take 1 tablet (300 mg total) by mouth 2 (two) times daily.   multivitamin with minerals tablet Take 1 tablet by mouth daily.   Oxycodone HCl 20 MG Tabs Take 1 tablet (20 mg total) by mouth every 4 (four) hours as needed (severe pain). What changed: when to take this   pantoprazole 40 MG tablet Commonly known as: PROTONIX Take 1 tablet (40 mg total) by mouth daily. Start taking on: February 16, 2023   ProAir HFA 108 (90 Base) MCG/ACT inhaler Generic drug: albuterol Inhale 2 puffs into the lungs 4 (four) times daily as needed for wheezing or shortness of breath.   triamcinolone cream 0.1 % Commonly known as: KENALOG Apply 1 Application topically 2 (two) times daily as needed (for irritation- affected area).               Discharge Care Instructions  (From admission, onward)  Start     Ordered   02/15/23 0000  Change dressing on IV access line weekly and PRN  (Home infusion instructions - [facility] )        02/15/23 0922   02/15/23 0000  Leave dressing on - Keep it clean, dry, and intact until clinic visit        02/15/23 1323   02/15/23 0000  Leave dressing on - Keep it clean, dry, and intact until clinic visit        02/15/23 1324            Discharge Exam: Filed Weights   02/12/23 0529 02/14/23 0500 02/15/23 0619  Weight: 109.7 kg 110.4 kg 107.7 kg   Subjective: Patient was feeling well, eager to dc at the time of my evaluation.  He reported need to have BM.   No other complaints.  When asked about ETOH intake he stated "not while I've been here" and denied use of drugs.  Reports he is comfortable with wound vac and plans to f/u with ortho Friday as scheduled.   Physical Exam:       Vitals:    02/14/23 1628 02/14/23 1940 02/15/23 0529 02/15/23 0619  BP: (!) 141/95 132/81 (!) 149/76    Pulse: 80 80 78    Resp: (!) 21 16 16     Temp: 97.9 F (36.6 C) 97.6 F (36.4 C) 97.8 F (36.6 C)    TempSrc: Oral Oral Oral    SpO2: 100% 100% 98%    Weight:       107.7 kg  Height:            General:  Appears calm and comfortable and is in NAD Eyes:  EOMI, normal lids, iris ENT:  grossly normal hearing, lips & tongue, mmm Neck:  no LAD, masses or thyromegaly Cardiovascular:  RRR, no m/r/g. No LE edema.  Respiratory:   CTA bilaterally with no wheezes/rales/rhonchi.  Normal respiratory effort. Abdomen:  soft, NT, ND Skin:  no rash or induration seen on limited exam Musculoskeletal:  R knee wrapped with wound vac in place draining sanguinous drainage Psychiatric:  mildly anxious mood and affect, speech fluent and appropriate, AOx3 Neurologic:  CN 2-12 grossly intact, moves all extremities in coordinated fashion    EKG: none     Labs on Admission: I have personally reviewed the available labs and imaging studies at the time of the admission.   Pertinent labs:     Na++ 130 Glucose 109 WBC 7.3 Hgb 11   Family Communication: Significant other was present throughout evaluation  Condition at discharge: improving  The results of significant diagnostics from this hospitalization (including imaging, microbiology, ancillary and laboratory) are listed below for reference.   Imaging Studies: [place] EKG SITE RITE  Result Date: 02/15/2023 If Site Rite image not attached, placement could not be confirmed due to current cardiac rhythm.   Microbiology: Results for orders placed or performed during the hospital encounter of 02/10/23  Blood culture (routine x  2)     Status: None   Collection Time: 02/10/23  1:25 PM   Specimen: BLOOD LEFT FOREARM  Result Value Ref Range Status   Specimen Description   Final    BLOOD LEFT FOREARM Performed at [facility], [address] [place], [place] [address]    Special Requests   Final    BOTTLES DRAWN AEROBIC AND ANAEROBIC Blood Culture results may not be optimal due to an excessive volume of  blood received in culture bottles Performed at [facility], [address], [place], [place] [address]    Culture   Final    NO GROWTH 5 DAYS Performed at [facility], [address], [place], [place] [address]    Report Status 02/15/2023 FINAL  Final  Blood culture (routine x 2)     Status: Abnormal   Collection Time: 02/10/23  1:30 PM   Specimen: BLOOD RIGHT FOREARM  Result Value Ref Range Status   Specimen Description   Final    BLOOD RIGHT FOREARM Performed at [facility], [address], [place], [place] [address]    Special Requests   Final    BOTTLES DRAWN AEROBIC AND ANAEROBIC Blood Culture results may not be optimal due to an excessive volume of blood received in culture bottles Performed at [facility], [address], [place], [place] [address]    Culture  Setup Time   Final    GRAM NEGATIVE RODS ANAEROBIC BOTTLE ONLY CRITICAL RESULT CALLED TO, READ BACK BY AND VERIFIED WITH: PHARMD J. GADHIA 02/11/23 @ 0731 BY AB    Culture (A)  Final    PASTEURELLA MULTOCIDA Usually susceptible to penicillin and other beta lactam agents,quinolones,macrolides and tetracyclines. Performed at [facility], [address] [place], [place] [address]    Report Status 02/13/2023 FINAL  Final  Blood Culture ID Panel (Reflexed)     Status: None   Collection Time: 02/10/23  1:30 PM  Result Value Ref Range Status   Enterococcus faecalis NOT DETECTED NOT DETECTED Final   Enterococcus Faecium NOT DETECTED NOT DETECTED Final   Listeria monocytogenes  NOT DETECTED NOT DETECTED Final   Staphylococcus species NOT DETECTED NOT DETECTED Final   Staphylococcus aureus (BCID) NOT DETECTED NOT DETECTED Final   Staphylococcus epidermidis NOT DETECTED NOT DETECTED Final   Staphylococcus lugdunensis NOT DETECTED NOT DETECTED Final   Streptococcus species NOT DETECTED NOT DETECTED Final   Streptococcus agalactiae NOT DETECTED NOT DETECTED Final   Streptococcus pneumoniae NOT DETECTED NOT DETECTED Final   Streptococcus pyogenes NOT DETECTED NOT DETECTED Final   A.calcoaceticus-baumannii NOT DETECTED NOT DETECTED Final   Bacteroides fragilis NOT DETECTED NOT DETECTED Final   Enterobacterales NOT DETECTED NOT DETECTED Final   Enterobacter cloacae complex NOT DETECTED NOT DETECTED Final   Escherichia coli NOT DETECTED NOT DETECTED Final   Klebsiella aerogenes NOT DETECTED NOT DETECTED Final   Klebsiella oxytoca NOT DETECTED NOT DETECTED Final   Klebsiella pneumoniae NOT DETECTED NOT DETECTED Final   Proteus species NOT DETECTED NOT DETECTED Final   Salmonella species NOT DETECTED NOT DETECTED Final   Serratia marcescens NOT DETECTED NOT DETECTED Final   Haemophilus influenzae NOT DETECTED NOT DETECTED Final   Neisseria meningitidis NOT DETECTED NOT DETECTED Final   Pseudomonas aeruginosa NOT DETECTED NOT DETECTED Final   Stenotrophomonas maltophilia NOT DETECTED NOT DETECTED Final   Candida albicans NOT DETECTED NOT DETECTED Final   Candida auris NOT DETECTED NOT DETECTED Final   Candida glabrata NOT DETECTED NOT DETECTED Final   Candida krusei NOT DETECTED NOT DETECTED Final   Candida parapsilosis NOT DETECTED NOT DETECTED Final   Candida tropicalis NOT DETECTED NOT DETECTED Final   Cryptococcus neoformans/gattii NOT DETECTED NOT DETECTED Final    Comment: Performed at [facility], [address] [place], [place] [address]  Surgical pcr screen     Status: None   Collection Time: 02/13/23 10:36 PM  Specimen: Nasal Mucosa; Nasal Swab   Result Value Ref Range Status   MRSA, PCR NEGATIVE NEGATIVE Final   Staphylococcus aureus NEGATIVE NEGATIVE Final    Comment: (NOTE) The Xpert SA Assay (FDA approved for NASAL specimens in patients 43 years of age and older), is one component of a comprehensive surveillance program. It is not intended to diagnose infection nor to guide or monitor treatment. Performed at [facility], [address], [place], [place] [address]   Aerobic/Anaerobic Culture w Gram Stain (surgical/deep wound)     Status: None (Preliminary result)   Collection Time: 02/14/23  2:31 PM   Specimen: Path fluid; Body Fluid  Result Value Ref Range Status   Specimen Description   Final    SYNOVIAL RIGHT KNEE Performed at [facility], [address] [place], [place] [address]    Special Requests UNISYN  Final   Gram Stain   Final    ABUNDANT WBC PRESENT, PREDOMINANTLY PMN NO ORGANISMS SEEN    Culture   Final    NO GROWTH < 12 HOURS Performed at [facility], [address] [place], [place] [address]    Report Status PENDING  Incomplete      Discharge time spent: greater than 30 minutes.  Signed: Jonah Blue, MD [facility] 02/15/2023

## 2023-02-15 NOTE — Progress Notes (Signed)
Physical Therapy Treatment Patient Details Name: Peter Thompson MRN: [id number] DOB: 1965-09-27 Today's Date: 02/15/2023   History of Present Illness 57 yo male presents to therapy following recent hospital admission on 02/10/2023 due to R knee pain and edema. 5/31 OP orthopedic, Dr. Jerl Santos and under went R knee arthrocentesis was purulent and pt instructed to come to ED. 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 Infectious dz and orthopedics continue to follow pt acutely. Pt is currently R LE WBAT. Pt PMH includes but is not limited to: c-spine sx and chronic pain, PAF and on anticoagulant therapy, HTN, HDL, LBP, OSA, multiple orthopedic sx-- R UE s/p fx, R wrist, L ankle, L hip, L shoulder, L THA, (2017) and R TKA (2020).    PT Comments     Pt admitted with above diagnosis.  Pt currently with functional limitations due to the deficits listed below (see PT Problem List). Pt seated EOB when PT arrived. Pt is mod I with bed mobility tasks, mod I for transfer tasks, mod I without AD for short amb bouts in personal room with pt reporting he has been up and down t/o am. Pt indicates pain is mildly better with report of 8/10 R knee pain. Pt required S for gait tasks in hallway without AD with wide BOS, lateral sway, R knee flexion t/o gait cycle and decreased cadence. Pt is requesting no further therapy services at this time in current or home setting. Pt indicates he is going to have PICC line placed for further ABX therapy and will d/c home today. PT is signing off.     Recommendations for follow up therapy are one component of a multi-disciplinary discharge planning process, led by the attending physician.  Recommendations may be updated based on patient status, additional functional criteria and insurance authorization.  Follow Up Recommendations       Assistance Recommended at Discharge Intermittent  Supervision/Assistance  Patient can return home with the following Assist for transportation;Help with stairs or ramp for entrance;A little help with walking and/or transfers;A little help with bathing/dressing/bathroom;Assistance with cooking/housework   Equipment Recommendations  Other (comment) (TBD- RW pending safety and stabilty with crutches)    Recommendations for Other Services       Precautions / Restrictions Precautions Precautions: Fall;Other (comment) (R LE hemovac drain) Restrictions Weight Bearing Restrictions: No     Mobility  Bed Mobility Overal bed mobility: Modified Independent Bed Mobility: Supine to Sit, Sit to Supine     Supine to sit: HOB elevated          Transfers Overall transfer level: Modified independent Equipment used: None               General transfer comment: pt reports he has been up and down and walking t/o his room this am and no need for crutches or other AD    Ambulation/Gait Ambulation/Gait assistance: Supervision Gait Distance (Feet): 120 Feet Assistive device: None Gait Pattern/deviations: Step-to pattern, Antalgic, Wide base of support Gait velocity: decreased     General Gait Details: lateral sway, R knee flexion t/o gait cycle   Stairs             Wheelchair Mobility    Modified Rankin (Stroke Patients Only)       Balance Overall balance assessment: Needs assistance Sitting-balance support: Feet supported Sitting balance-Leahy Scale: Good     Standing balance support: No upper extremity supported  Standing balance-Leahy Scale: Fair                              Cognition Arousal/Alertness: Awake/alert Behavior During Therapy: WFL for tasks assessed/performed Overall Cognitive Status: Within Functional Limits for tasks assessed                                          Exercises      General Comments General comments (skin integrity, edema, etc.): Pt declines  any further therapy intervention in current or home setting. pt states he is awaitng picc line placement and anticipates d/c today.      Pertinent Vitals/Pain Pain Assessment Pain Assessment: 0-10 Pain Score: 8  Pain Location: R LE/knee Pain Descriptors / Indicators: Constant, Discomfort, Operative site guarding Pain Intervention(s): Limited activity within patient's tolerance, Monitored during session, Premedicated before session    Home Living                          Prior Function            PT Goals (current goals can now be found in the care plan section) Acute Rehab PT Goals Patient Stated Goal: to go home and not have R knee pain PT Goal Formulation: With patient Time For Goal Achievement: 02/28/23 Potential to Achieve Goals: Good Progress towards PT goals: Progressing toward goals    Frequency    Min 1X/week      PT Plan      Co-evaluation              AM-PAC PT "6 Clicks" Mobility   Outcome Measure  Help needed turning from your back to your side while in a flat bed without using bedrails?: None Help needed moving from lying on your back to sitting on the side of a flat bed without using bedrails?: None Help needed moving to and from a bed to a chair (including a wheelchair)?: None Help needed standing up from a chair using your arms (e.g., wheelchair or bedside chair)?: None Help needed to walk in hospital room?: A Little Help needed climbing 3-5 steps with a railing? : A Lot 6 Click Score: 21    End of Session   Activity Tolerance: Patient tolerated treatment well Patient left: in bed;with call bell/phone within reach Nurse Communication: Mobility status PT Visit Diagnosis: Unsteadiness on feet (R26.81);Other abnormalities of gait and mobility (R26.89);Muscle weakness (generalized) (M62.81);Difficulty in walking, not elsewhere classified (R26.2);Pain Pain - Right/Left: Right Pain - part of body: Knee;Leg     Time: 1610-9604 PT  Time Calculation (min) (ACUTE ONLY): 16 min  Charges:  $Gait Training: 8-22 mins                     Johnny Bridge, PT Acute Rehab    Jacqualyn Posey 02/15/2023, 11:33 AM

## 2023-02-15 NOTE — Progress Notes (Signed)
Peripherally Inserted Central Catheter Placement  The IV Nurse has discussed with the patient and/or persons authorized to consent for the patient, the purpose of this procedure and the potential benefits and risks involved with this procedure.  The benefits include less needle sticks, lab draws from the catheter, and the patient may be discharged home with the catheter. Risks include, but not limited to, infection, bleeding, blood clot (thrombus formation), and puncture of an artery; nerve damage and irregular heartbeat and possibility to perform a PICC exchange if needed/ordered by physician.  Alternatives to this procedure were also discussed.  Bard Power PICC patient education guide, fact sheet on infection prevention and patient information card has been provided to patient /or left at bedside.    PICC Placement Documentation  PICC Single Lumen 02/15/23 Right Brachial 41 cm 0 cm (Active)  Indication for Insertion or Continuance of Line Home intravenous therapies (PICC only) 02/15/23 1556  Exposed Catheter (cm) 0 cm 02/15/23 1556  Site Assessment Clean, Dry, Intact 02/15/23 1556  Line Status Flushed;Saline locked;Blood return noted 02/15/23 1556  Dressing Type Transparent;Securing device 02/15/23 1556  Dressing Status Antimicrobial disc in place;Clean, Dry, Intact 02/15/23 1556  Safety Lock Not Applicable 02/15/23 1556  Line Care Connections checked and tightened 02/15/23 1556  Line Adjustment (NICU/IV Team Only) No 02/15/23 1556  Dressing Intervention New dressing;Other (Comment) 02/15/23 1556  Dressing Change Due 02/22/23 02/15/23 1556       Annett Fabian 02/15/2023, 3:57 PM

## 2023-02-15 NOTE — Plan of Care (Signed)

## 2023-02-16 LAB — AEROBIC/ANAEROBIC CULTURE W GRAM STAIN (SURGICAL/DEEP WOUND)

## 2023-02-19 LAB — AEROBIC/ANAEROBIC CULTURE W GRAM STAIN (SURGICAL/DEEP WOUND): Culture: NO GROWTH

## 2023-02-22 ENCOUNTER — Telehealth: Payer: Self-pay | Admitting: Pharmacist

## 2023-02-22 NOTE — Telephone Encounter (Signed)
Patient's wife Peter Thompson called today stating his primary care doctor started him on metronidazole, and she wanted to make sure it would not interact with his ceftriaxone. Reviewed these can be taken together. She implied his PCP started it for his frequent bowel movements. Andon states he has been experiencing 5-6 bowel movements daily that are somewhat watery but also somewhat solid. Not sure why PCP started metronidazole for this. However, with a somewhat solid consistency, I would not associate it with C. diff infection. I recommended trialing OTC loperamide for relief. If you are concerned about C. diff let me know.  Margarite Gouge, PharmD, CPP, BCIDP, AAHIVP Clinical Pharmacist Practitioner Infectious Diseases Clinical Pharmacist [facility]

## 2023-03-13 ENCOUNTER — Ambulatory Visit: Payer: Medicaid Other | Admitting: Infectious Diseases

## 2023-03-23 ENCOUNTER — Telehealth: Payer: Self-pay

## 2023-03-23 ENCOUNTER — Encounter: Payer: Self-pay | Admitting: Infectious Diseases

## 2023-03-23 ENCOUNTER — Ambulatory Visit (INDEPENDENT_AMBULATORY_CARE_PROVIDER_SITE_OTHER): Payer: Medicaid Other | Admitting: Infectious Diseases

## 2023-03-23 ENCOUNTER — Other Ambulatory Visit: Payer: Self-pay

## 2023-03-23 VITALS — BP 125/83 | HR 140 | Temp 97.3°F | Ht 70.0 in | Wt 241.0 lb

## 2023-03-23 DIAGNOSIS — T8459XD Infection and inflammatory reaction due to other internal joint prosthesis, subsequent encounter: Secondary | ICD-10-CM

## 2023-03-23 DIAGNOSIS — I739 Peripheral vascular disease, unspecified: Secondary | ICD-10-CM | POA: Diagnosis not present

## 2023-03-23 DIAGNOSIS — T8453XD Infection and inflammatory reaction due to internal right knee prosthesis, subsequent encounter: Secondary | ICD-10-CM | POA: Diagnosis present

## 2023-03-23 DIAGNOSIS — Z79899 Other long term (current) drug therapy: Secondary | ICD-10-CM | POA: Diagnosis not present

## 2023-03-23 DIAGNOSIS — I872 Venous insufficiency (chronic) (peripheral): Secondary | ICD-10-CM | POA: Diagnosis not present

## 2023-03-23 DIAGNOSIS — A280 Pasteurellosis: Secondary | ICD-10-CM | POA: Insufficient documentation

## 2023-03-23 MED ORDER — AMOXICILLIN 500 MG PO TABS
1000.0000 mg | ORAL_TABLET | Freq: Three times a day (TID) | ORAL | 5 refills | Status: DC
Start: 2023-03-23 — End: 2023-04-03

## 2023-03-23 NOTE — Progress Notes (Signed)
Patient's PICC dressing last changed 7/8. Heavily reinforced by patient with transpore tape. Sterile dressing changed performed with BioPatch. Mild redness at borders of adhesive dressing. Insertion site absent of redness or drainage.   Sandie Ano, RN

## 2023-03-23 NOTE — Progress Notes (Addendum)
Patient Active Problem List   Diagnosis Date Noted   Infected prosthetic knee joint (HCC) 02/11/2023   Gram-negative bacteremia 02/11/2023   Peripheral vascular disease (HCC) 02/11/2023   Septic arthritis of knee (HCC) 02/10/2023   Chronic tension-type headache, intractable 07/11/2022   Peripheral vascular disease, unspecified (HCC) 04/21/2022   Sensorineural hearing loss, bilateral 10/21/2021   Venous stasis dermatitis of both lower extremities 09/07/2021   Tinea pedis of both feet 09/07/2021   Contusion of left foot 09/07/2021   Umbilical hernia without obstruction and without gangrene 03/04/2021   Abnormal results of liver function studies 01/21/2021   Acute gastritis 01/21/2021   History of arthroscopic procedure on shoulder 01/21/2021   Low back pain 01/21/2021   Other and unspecified hyperlipidemia 01/21/2021   Obesity 01/21/2021   Other specified counseling 01/21/2021   Other, mixed, or unspecified nondependent drug abuse, unspecified 01/21/2021   Psychosocial circumstance 01/21/2021   Concussion    Arthritis    Cervicogenic headache 10/06/2020   Permanent atrial fibrillation (HCC) 07/17/2020   Tinnitus, bilateral    Neck fracture (HCC)    Insomnia    Hypertension    DJD (degenerative joint disease)    Complication of anesthesia    Bursitis    Osteoarthrosis, unspecified whether generalized or localized, other specified sites    Anxiety    Obstructive sleep apnea (adult) (pediatric) 09/04/2019   Obstructive sleep apnea 09/04/2019   Left-sided epistaxis 01/30/2019   Paroxysmal atrial fibrillation (HCC) 12/26/2018   Essential (primary) hypertension 10/17/2018   Anemia 10/17/2018   Essential hypertension 10/17/2018   Persistent atrial fibrillation (HCC)    Primary osteoarthritis of right knee 10/02/2018   Pre-op evaluation 12/05/2016   Smoking 12/05/2016   Primary localized osteoarthritis of left hip 05/10/2016   Primary osteoarthritis of left hip  05/10/2016    Patient's Medications  New Prescriptions   No medications on file  Previous Medications   ALPRAZOLAM (XANAX) 0.5 MG TABLET    Take 1 mg by mouth at bedtime.   AMLODIPINE (NORVASC) 10 MG TABLET    Take 10 mg by mouth daily.   APIXABAN (ELIQUIS) 5 MG TABS TABLET    Take 1 tablet (5 mg total) by mouth 2 (two) times daily.   BUDESONIDE-FORMOTEROL (SYMBICORT) 160-4.5 MCG/ACT INHALER    Inhale 2 puffs into the lungs 2 (two) times daily as needed (for flares).   CEFTRIAXONE (ROCEPHIN) IVPB    Inject 2 g into the vein daily. Indication:  Right Knee PJI  First Dose: Yes Last Day of Therapy:  03/28/23 Labs - Once weekly:  CBC/D and BMP, Labs - Once weekly: ESR and CRP Method of administration: IV Push Method of administration may be changed at the discretion of home infusion pharmacist based upon assessment of the patient and/or caregiver's ability to self-administer the medication ordered.   CLONIDINE (CATAPRES) 0.2 MG TABLET    Take 0.2 mg by mouth 3 (three) times daily.   FUROSEMIDE (LASIX) 80 MG TABLET    Take 80 mg by mouth in the morning.   LABETALOL (NORMODYNE) 300 MG TABLET    Take 1 tablet (300 mg total) by mouth 2 (two) times daily.   MULTIPLE VITAMINS-MINERALS (MULTIVITAMIN WITH MINERALS) TABLET    Take 1 tablet by mouth daily.   OXYCODONE 20 MG TABS    Take 1 tablet (20 mg total) by mouth every 4 (four) hours as needed (severe pain).   PANTOPRAZOLE (PROTONIX) 40 MG  TABLET    Take 1 tablet (40 mg total) by mouth daily.   PROAIR HFA 108 (90 BASE) MCG/ACT INHALER    Inhale 2 puffs into the lungs 4 (four) times daily as needed for wheezing or shortness of breath.   TRIAMCINOLONE CREAM (KENALOG) 0.1 %    Apply 1 Application topically 2 (two) times daily as needed (for irritation- affected area).  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  Patient had underwent arthrocentesis at outside [facility] office with WBC 136900, no growth in cultures. Blood cx 5/31 1/2 sets pasteurella multocida. status post I&D on 6/4 with no growth in OR cultures.  Seen by ID and patient was discharged on 6/5 with 6 weeks course of IV ceftriaxone from 6/4.   7/11 Seen Dr Jerl Santos after discharge- no concerns. Getting IV ceftriaxone through rt arm PICC with no concerns. Denies fevers, chills. Denies nausea, vomiting and diarrhea. Pain in the rt knee has significantly improved compared to when admitted. Complains of swelling in the Rl lower extremity which seems to have been present for a long time. There is also weeping in the lower leg which he reports me as sweating. Left leg also has some chronic venous stasis changes. He is not aware of any venous disease of lower extremity and willing to see a vascular surgeon. Reports he has hardware in rt arm, rt wrist, neck and left hand.   Review of Systems: all systems reviewed with pertinent positive and negative as listed above  Past Medical History:  Diagnosis Date   Abnormal results of liver function studies 01/21/2021   Anemia 10/17/2018   Anxiety    Arthritis    Bursitis    Cervicogenic headache 10/06/2020   Complication of anesthesia    "woke up in OR- IV came out."   Concussion    several- head injury- was in milatary   DJD (degenerative joint disease)    Essential hypertension 10/17/2018   History of arthroscopic procedure on shoulder 01/21/2021   Hypertension    Insomnia    Left-sided epistaxis 01/30/2019   Low back pain 01/21/2021   Neck fracture (HCC)    Obesity 01/21/2021   Obstructive sleep apnea 09/04/2019   Osteoarthrosis, unspecified whether generalized or localized, other  specified sites    Other and unspecified hyperlipidemia 01/21/2021   Other specified counseling 01/21/2021   Other, mixed, or unspecified nondependent drug abuse, unspecified 01/21/2021   Paroxysmal atrial fibrillation (HCC) 12/26/2018   Persistent atrial fibrillation (HCC)    Pre-op evaluation 12/05/2016   Primary localized osteoarthritis of left hip 05/10/2016   Primary osteoarthritis of left hip 05/10/2016   Primary osteoarthritis of right knee 10/02/2018   Psychosocial circumstance 01/21/2021   Smoking 12/05/2016   Tinnitus, bilateral    Past Surgical History:  Procedure Laterality Date   ANKLE SURGERY Left    Arm surgery Right    fracture repair   CERVICAL SPINE SURGERY     bone graft from left hip   HAND SURGERY Left    drains for Infection   HIP SURGERY Left    "scrapped" x2   KNEE ARTHROSCOPY Right    KNEE ARTHROSCOPY Right  02/14/2023   Procedure: RIGHT KNEE ARTHROSCOPY WITH IRRIGATION AND DEBRIDEMENT;  Surgeon: Marcene Corning, MD;  Location: [facility];  Service: Orthopedics;  Laterality: Right;   LEG SURGERY Left    "1 inch took out"   SHOULDER ARTHROSCOPY Left 12/27/2016   Procedure: ARTHROSCOPY SHOULDER;  Surgeon: Marcene Corning, MD;  Location: [facility] OR;  Service: Orthopedics;  Laterality: Left;  Debridement, AC Decompression, Acromioplasty    TOTAL HIP ARTHROPLASTY Left 05/10/2016   Procedure: TOTAL HIP ARTHROPLASTY ANTERIOR APPROACH;  Surgeon: Marcene Corning, MD;  Location: [facility] OR;  Service: Orthopedics;  Laterality: Left;   TOTAL KNEE ARTHROPLASTY Right 10/02/2018   Procedure: TOTAL KNEE ARTHROPLASTY;  Surgeon: Marcene Corning, MD;  Location: [facility] OR;  Service: Orthopedics;  Laterality: Right;   WRIST SURGERY Right    2 Pinns     Social History   Tobacco Use   Smoking status: Former    Current packs/day: 0.00    Types: Cigarettes    Quit date: 12/25/2020    Years since quitting: 2.2   Smokeless tobacco: Former    Types: Chew   Tobacco comments:    smokes less than 0.5 ppd  now  Vaping Use   Vaping status: Never Used  Substance Use Topics   Alcohol use: Yes    Alcohol/week: 12.0 standard drinks of alcohol    Types: 12 Cans of beer per week   Drug use: No    Family History  Problem Relation Age of Onset   Cancer Other    Heart disease Neg Hx    Headache Neg Hx    Migraines Neg Hx     No Known Allergies  Health Maintenance  Topic Date Due   COVID-19 Vaccine (1) Never done   Hepatitis C Screening  Never done   DTaP/Tdap/Td (1 - Tdap) Never done   Colonoscopy  Never done   Lung Cancer Screening  Never done   INFLUENZA VACCINE  04/13/2023   HIV Screening  Completed   Zoster Vaccines- Shingrix  Completed   HPV VACCINES  Aged Out    Objective: BP 125/83   Pulse (!) 140   Temp (!) 97.3 F (36.3 C) (Oral)   Ht 5\' 10"  (1.778 m)   Wt 241 lb (109.3 kg)   SpO2 98%   BMI 34.58 kg/m    Physical Exam Constitutional:      Appearance: Normal appearance. Appears sleepy HENT:     Head: Normocephalic and atraumatic.      Mouth: Mucous membranes are moist.  Eyes:    Conjunctiva/sclera: Conjunctivae normal.     Pupils: Pupils are equal, round, and bilaterally symmetrical   Cardiovascular:     Rate and Rhythm: Normal rate and Irregular rhythm.     Heart sounds: s1s2  Pulmonary:     Effort: Pulmonary effort is normal.     Breath sounds: Normal breath sounds.   Abdominal:     General: Non distended     Palpations: soft.   Musculoskeletal:        General: Normal range of motion. Chronic venous stasis changes in lower extremities. Rt knee surgical site has almost healed. Some weeping in the rt leg but no open wounds or cellulitis or signs of infection  Left leg    Rt leg      Skin:    General: Skin is warm and dry.     Comments:  Neurological:     General: grossly non focal     Mental Status: awake,  alert and oriented to person, place, and time. Ambulatory with a cane  Psychiatric:        Mood and Affect: Mood normal.   Lab  Results Lab Results  Component Value Date   WBC 7.3 02/14/2023   HGB 11.0 (L) 02/14/2023   HCT 32.5 (L) 02/14/2023   MCV 89.5 02/14/2023   PLT 299 02/14/2023    Lab Results  Component Value Date   CREATININE 0.66 02/14/2023   BUN 25 (H) 02/14/2023   NA 130 (L) 02/14/2023   K 4.0 02/14/2023   CL 96 (L) 02/14/2023   CO2 23 02/14/2023    Lab Results  Component Value Date   ALT 42 02/13/2023   AST 33 02/13/2023   ALKPHOS 103 02/13/2023   BILITOT 1.0 02/13/2023    Lab Results  Component Value Date   CHOL 130 01/29/2021   HDL 36 (L) 01/29/2021   LDLCALC 72 01/29/2021   TRIG 120 01/29/2021   CHOLHDL 3.6 01/29/2021   No results found for: "LABRPR", "RPRTITER" No results found for: "HIV1RNAQUANT", "HIV1RNAVL", "CD4TABS"   Assessment/plan # Right knee PJI Complete 6 weeks of IV ceftriaxone 7/16 then start amoxicillin 1g po tid thereafter for 3-6 months course  Although suspect the arthrocentesis done at ortho office prior to admission was negative, but will request their office to send final culture reports for completeness Fu with Ortho as instructed Fu with [place] in 6 weeks   # Medication management  6/25 CBC, CMP unremarkable, ESR 49, CRP 13  6/18 ESR 72, CRP 77  # PICC  Will coordinate for bandage with HH as looks little dirty   # Chronic venous stasis dermatitis/PVD Amb referral to Vascular  I have personally spent 56 minutes involved in face-to-face and non-face-to-face activities for this patient on the day of the visit. Professional time spent includes the following activities: Preparing to see the patient (review of tests), Obtaining and/or reviewing separately obtained history (admission/discharge record), Performing a medically appropriate examination and/or evaluation , Ordering medications/tests/procedures, referring and communicating with other health care professionals, Documenting clinical information in the EMR, Independently interpreting results (not  separately reported), Communicating results to the patient/family/caregiver, Counseling and educating the patient/family/caregiver and Care coordination (not separately reported).   Victoriano Lain, MD [facility] 03/23/2023, 9:25 AM

## 2023-03-23 NOTE — Telephone Encounter (Signed)
Per Dr. Elinor Parkinson opat end date for pull picc and stop iv abx on 7/16. Sent order to Jeri Modena RN at [facility].

## 2023-03-24 ENCOUNTER — Other Ambulatory Visit: Payer: Self-pay | Admitting: Internal Medicine

## 2023-03-24 MED ORDER — CEFADROXIL 500 MG PO CAPS: 1000.0000 mg | ORAL_CAPSULE | Freq: Two times a day (BID) | ORAL | 3 refills | Status: DC

## 2023-03-24 NOTE — Progress Notes (Signed)
Was called by home health RN that the patient's picc line has migrated out and unable to use for further iv abtx. His end date was to be 7/16. Recommend to pull picc line tonite on 7/12, start oral abtx, cefadroxil 1000mg  bid starting on 7/13. To follow up with dr vu.  Aram Beecham B. Drue Second MD MPH [facility] [phone]

## 2023-03-30 ENCOUNTER — Telehealth: Payer: Self-pay

## 2023-03-30 NOTE — Telephone Encounter (Signed)
Patients spouse called office requesting advise regarding rash. States that since picc line has been pulled has been itchy at site and down arm. Also having rash on legs, but has had this since IV antibiotics. Is only using topical hydrocortisone cream with benadryl.  Call transferred to pharmacy team.  Denies SOB or swelling. Juanita Laster, RMA

## 2023-03-30 NOTE — Telephone Encounter (Addendum)
Spoke to [place] on the phone. He states that the rash on his arm started a day after he had the PICC removed. He says it "itches like crazy".  He previously took 6 weeks of IV ceftriaxone with no issues. He is now taking amoxicillin. He states he has taken amoxicillin several times in the past and has not had any issues.  Arms are not weepy or leaking any fluid; it is more like a dry rash. He has tried OTC benadryl with little relief. His legs are also itching but this is not new. He is still awaiting a call from vascular to get scheduled. I told him that we could potentially send in medication (hydroxyzine) for the itching but that it does interact with his alprazolam, clonidine, and oxycodone. Stated I would send information to Dr. Elinor Parkinson to see if she had any thoughts.   L. Jannette Fogo, PharmD, BCIDP, AAHIVP, CPP Clinical Pharmacist Practitioner Infectious Diseases Clinical Pharmacist [facility] 03/30/2023, 10:25 AM

## 2023-03-31 MED ORDER — TRIAMCINOLONE ACETONIDE 0.5 % EX OINT: 1.0000 | TOPICAL_OINTMENT | Freq: Two times a day (BID) | CUTANEOUS | 0 refills | Status: DC

## 2023-03-31 NOTE — Telephone Encounter (Signed)
Received voicemail from patient's spouse asking for a call back regarding Trustin's rash. Called back and spoke with Lorin Picket, he reports there are no new developments with his rash, but that he is still miserable. Advised him we are waiting to hear back from the provider for recommendations.   Sandie Ano, RN

## 2023-03-31 NOTE — Telephone Encounter (Signed)
Spoke with patient's wife and notified her that Peter Thompson cream has been sent in. Offered them an appointment with Dr. Elinor Thompson on Tuesday next week at 8:45 am. She states she gets off work at midnight and that this is too early. Advised this is the only appointment we have next week, she declines.   Would like to know if Peter Thompson should continue with his antibiotics and if so, which ones.  Peter Ano, RN

## 2023-03-31 NOTE — Telephone Encounter (Signed)
I tried to help him set up mychart. I sent him a text and walked him through it but he said he couldn't do it. FYI.

## 2023-03-31 NOTE — Telephone Encounter (Signed)
Patient does not have mychart and refuses to setup mychart. Does not look like we have any availability next week. I asked patient to follow up with pcp or UC. He reports pcp just advised him to use Vaseline on the rash. Would you like to send in any medication?

## 2023-03-31 NOTE — Telephone Encounter (Signed)
Spoke with Peter Thompson's wife Peter Thompson. Advised her per Aggie Cosier, RPH that Peter Thompson should stop the amoxicillin and start taking the cefadroxil and use the Kenalog cream over the weekend. Asked her to call on Monday if his symptoms haven't improved.   Advised that if Peter Thompson develops any concerning symptoms over the weekend such as the rash spreading, shortness of breath, wheezing, or difficulty breathing that Peter Thompson should go to the emergency department. Asked her to please call his PCP to see if they can get an appointment with them early next week for evaluation. Peter Thompson verbalized understanding.   Sandie Ano, RN

## 2023-03-31 NOTE — Addendum Note (Signed)
Addended byRutha Bouchard T on: 03/31/2023 10:52 AM   Modules accepted: Orders

## 2023-03-31 NOTE — Telephone Encounter (Signed)
Per Dr. Orlando Penner last note inpatient, let's do cefadroxil 1000 mg PO BID and STOP amoxicillin. Maybe that will help the rash too. Have him use the Kenalog cream over the weekend and let us know on Monday if it doesn't get any better. Also, go to urgent care if it gets significantly worse/spreads/he develops SOB, etc. Also if they can call their PCP today and see if they can get in early next week for evaluation if it does not resolve since they won't follow up here.

## 2023-04-02 ENCOUNTER — Other Ambulatory Visit: Payer: Self-pay | Admitting: Cardiology

## 2023-04-02 DIAGNOSIS — I4819 Other persistent atrial fibrillation: Secondary | ICD-10-CM

## 2023-04-03 ENCOUNTER — Other Ambulatory Visit: Payer: Self-pay | Admitting: Infectious Diseases

## 2023-04-03 MED ORDER — DOXYCYCLINE HYCLATE 100 MG PO TABS: 100.0000 mg | ORAL_TABLET | Freq: Two times a day (BID) | ORAL | 5 refills | Status: AC

## 2023-04-03 NOTE — Telephone Encounter (Signed)
Eliquis 5mg  refill request received. Patient is 57 years old, weight-109.3kg, Crea-0.66 on 02/14/23, Diagnosis-Afib, and last seen by Dr. Bing Matter on 04/21/22. Dose is appropriate based on dosing criteria. Will send in refill to requested pharmacy.

## 2023-04-04 ENCOUNTER — Ambulatory Visit: Payer: Medicaid Other | Admitting: Infectious Diseases

## 2023-04-16 ENCOUNTER — Inpatient Hospital Stay (HOSPITAL_COMMUNITY): Payer: Medicaid Other | Admitting: Certified Registered Nurse Anesthetist

## 2023-04-16 ENCOUNTER — Other Ambulatory Visit: Payer: Self-pay

## 2023-04-16 ENCOUNTER — Encounter (HOSPITAL_COMMUNITY): Payer: Self-pay | Admitting: Internal Medicine

## 2023-04-16 ENCOUNTER — Encounter (HOSPITAL_COMMUNITY)
Admission: EM | Disposition: A | Discharge status: Home / Self Care | Payer: Self-pay | Source: Home / Self Care | Attending: Emergency Medicine

## 2023-04-16 ENCOUNTER — Observation Stay (HOSPITAL_COMMUNITY)
Admission: EM | Admit: 2023-04-16 | Discharge: 2023-04-17 | Disposition: A | Discharge status: Home / Self Care | Payer: Medicaid Other | Attending: Internal Medicine | Admitting: Internal Medicine

## 2023-04-16 DIAGNOSIS — I10 Essential (primary) hypertension: Secondary | ICD-10-CM | POA: Diagnosis not present

## 2023-04-16 DIAGNOSIS — K420 Umbilical hernia with obstruction, without gangrene: Secondary | ICD-10-CM

## 2023-04-16 DIAGNOSIS — D649 Anemia, unspecified: Secondary | ICD-10-CM | POA: Diagnosis not present

## 2023-04-16 DIAGNOSIS — I4819 Other persistent atrial fibrillation: Secondary | ICD-10-CM | POA: Diagnosis not present

## 2023-04-16 DIAGNOSIS — Z7901 Long term (current) use of anticoagulants: Secondary | ICD-10-CM | POA: Diagnosis not present

## 2023-04-16 DIAGNOSIS — E876 Hypokalemia: Secondary | ICD-10-CM | POA: Diagnosis not present

## 2023-04-16 DIAGNOSIS — I739 Peripheral vascular disease, unspecified: Secondary | ICD-10-CM | POA: Diagnosis present

## 2023-04-16 DIAGNOSIS — T8459XA Infection and inflammatory reaction due to other internal joint prosthesis, initial encounter: Secondary | ICD-10-CM

## 2023-04-16 DIAGNOSIS — R9431 Abnormal electrocardiogram [ECG] [EKG]: Secondary | ICD-10-CM | POA: Diagnosis present

## 2023-04-16 DIAGNOSIS — Z87891 Personal history of nicotine dependence: Secondary | ICD-10-CM | POA: Diagnosis not present

## 2023-04-16 DIAGNOSIS — I4581 Long QT syndrome: Secondary | ICD-10-CM | POA: Diagnosis not present

## 2023-04-16 DIAGNOSIS — Z96641 Presence of right artificial hip joint: Secondary | ICD-10-CM | POA: Diagnosis not present

## 2023-04-16 DIAGNOSIS — I480 Paroxysmal atrial fibrillation: Secondary | ICD-10-CM | POA: Diagnosis not present

## 2023-04-16 DIAGNOSIS — I4821 Permanent atrial fibrillation: Secondary | ICD-10-CM | POA: Diagnosis not present

## 2023-04-16 DIAGNOSIS — J45909 Unspecified asthma, uncomplicated: Secondary | ICD-10-CM | POA: Diagnosis not present

## 2023-04-16 DIAGNOSIS — K56699 Other intestinal obstruction unspecified as to partial versus complete obstruction: Secondary | ICD-10-CM | POA: Diagnosis not present

## 2023-04-16 DIAGNOSIS — K56609 Unspecified intestinal obstruction, unspecified as to partial versus complete obstruction: Secondary | ICD-10-CM | POA: Diagnosis not present

## 2023-04-16 DIAGNOSIS — Z79899 Other long term (current) drug therapy: Secondary | ICD-10-CM | POA: Insufficient documentation

## 2023-04-16 DIAGNOSIS — R188 Other ascites: Secondary | ICD-10-CM

## 2023-04-16 DIAGNOSIS — K7460 Unspecified cirrhosis of liver: Secondary | ICD-10-CM | POA: Diagnosis present

## 2023-04-16 DIAGNOSIS — G4733 Obstructive sleep apnea (adult) (pediatric): Secondary | ICD-10-CM | POA: Diagnosis present

## 2023-04-16 DIAGNOSIS — Z96651 Presence of right artificial knee joint: Secondary | ICD-10-CM | POA: Diagnosis not present

## 2023-04-16 DIAGNOSIS — E669 Obesity, unspecified: Secondary | ICD-10-CM | POA: Diagnosis present

## 2023-04-16 DIAGNOSIS — R109 Unspecified abdominal pain: Secondary | ICD-10-CM | POA: Diagnosis present

## 2023-04-16 DIAGNOSIS — J449 Chronic obstructive pulmonary disease, unspecified: Secondary | ICD-10-CM | POA: Diagnosis not present

## 2023-04-16 DIAGNOSIS — Z96659 Presence of unspecified artificial knee joint: Secondary | ICD-10-CM

## 2023-04-16 DIAGNOSIS — T8459XD Infection and inflammatory reaction due to other internal joint prosthesis, subsequent encounter: Secondary | ICD-10-CM

## 2023-04-16 HISTORY — PX: UMBILICAL HERNIA REPAIR: SHX196

## 2023-04-16 LAB — CBC WITH DIFFERENTIAL/PLATELET
Abs Immature Granulocytes: 0.02 10*3/uL (ref 0.00–0.07)
Basophils Absolute: 0 10*3/uL (ref 0.0–0.1)
Basophils Relative: 1 %
Eosinophils Absolute: 0.5 10*3/uL (ref 0.0–0.5)
Eosinophils Relative: 6 %
HCT: 38.2 % — ABNORMAL LOW (ref 39.0–52.0)
Hemoglobin: 12.5 g/dL — ABNORMAL LOW (ref 13.0–17.0)
Immature Granulocytes: 0 %
Lymphocytes Relative: 14 %
Lymphs Abs: 1.2 10*3/uL (ref 0.7–4.0)
MCH: 28.8 pg (ref 26.0–34.0)
MCHC: 32.7 g/dL (ref 30.0–36.0)
MCV: 88 fL (ref 80.0–100.0)
Monocytes Absolute: 0.9 10*3/uL (ref 0.1–1.0)
Monocytes Relative: 11 %
Neutro Abs: 5.9 10*3/uL (ref 1.7–7.7)
Neutrophils Relative %: 68 %
Platelets: 314 10*3/uL (ref 150–400)
RBC: 4.34 MIL/uL (ref 4.22–5.81)
RDW: 13.7 % (ref 11.5–15.5)
WBC: 8.6 10*3/uL (ref 4.0–10.5)
nRBC: 0 % (ref 0.0–0.2)

## 2023-04-16 LAB — LIPASE, BLOOD: Lipase: 26 U/L (ref 11–51)

## 2023-04-16 LAB — COMPREHENSIVE METABOLIC PANEL
ALT: 20 U/L (ref 0–44)
AST: 24 U/L (ref 15–41)
Albumin: 3.7 g/dL (ref 3.5–5.0)
Alkaline Phosphatase: 107 U/L (ref 38–126)
Anion gap: 10 (ref 5–15)
BUN: 10 mg/dL (ref 6–20)
CO2: 29 mmol/L (ref 22–32)
Calcium: 8.8 mg/dL — ABNORMAL LOW (ref 8.9–10.3)
Chloride: 93 mmol/L — ABNORMAL LOW (ref 98–111)
Creatinine, Ser: 0.8 mg/dL (ref 0.61–1.24)
GFR, Estimated: >60 mL/min (ref >60)
Glucose, Bld: 122 mg/dL — ABNORMAL HIGH (ref 70–99)
Potassium: 3.3 mmol/L — ABNORMAL LOW (ref 3.5–5.1)
Sodium: 132 mmol/L — ABNORMAL LOW (ref 135–145)
Total Bilirubin: 1.2 mg/dL (ref 0.3–1.2)
Total Protein: 6.8 g/dL (ref 6.5–8.1)

## 2023-04-16 LAB — AMMONIA: Ammonia: 33 umol/L (ref 9–35)

## 2023-04-16 LAB — I-STAT CG4 LACTIC ACID, ED: Lactic Acid, Venous: 1.3 mmol/L (ref 0.5–1.9)

## 2023-04-16 LAB — PROTIME-INR
INR: 1.2 (ref 0.8–1.2)
Prothrombin Time: 15.1 seconds (ref 11.4–15.2)

## 2023-04-16 SURGERY — REPAIR, HERNIA, UMBILICAL, ADULT
Anesthesia: General

## 2023-04-16 MED ORDER — LIDOCAINE HCL (CARDIAC) PF 100 MG/5ML IV SOSY
PREFILLED_SYRINGE | INTRAVENOUS | Status: DC | PRN
  Administered 2023-04-16: 100 mg via INTRAVENOUS

## 2023-04-16 MED ORDER — DEXAMETHASONE SODIUM PHOSPHATE 10 MG/ML IJ SOLN
INTRAMUSCULAR | Status: AC
  Filled 2023-04-16: qty 1

## 2023-04-16 MED ORDER — CHLORHEXIDINE GLUCONATE CLOTH 2 % EX PADS: 6.0000 | MEDICATED_PAD | Freq: Once | CUTANEOUS | Status: DC

## 2023-04-16 MED ORDER — HYDROMORPHONE HCL 1 MG/ML IJ SOLN
1.0000 mg | INTRAMUSCULAR | Status: DC | PRN
  Administered 2023-04-17 (×4): 1 mg via INTRAVENOUS
  Filled 2023-04-16 (×4): qty 1

## 2023-04-16 MED ORDER — SUCCINYLCHOLINE CHLORIDE 200 MG/10ML IV SOSY
PREFILLED_SYRINGE | INTRAVENOUS | Status: DC | PRN
  Administered 2023-04-16: 140 mg via INTRAVENOUS

## 2023-04-16 MED ORDER — MORPHINE SULFATE (PF) 4 MG/ML IV SOLN
4.0000 mg | Freq: Once | INTRAVENOUS | Status: DC
  Filled 2023-04-16: qty 1

## 2023-04-16 MED ORDER — OXYCODONE HCL 5 MG PO TABS: 10.0000 mg | ORAL_TABLET | Freq: Once | ORAL | Status: DC | PRN

## 2023-04-16 MED ORDER — SODIUM CHLORIDE 0.9 % IV SOLN
100.0000 mg | Freq: Two times a day (BID) | INTRAVENOUS | Status: DC
  Administered 2023-04-16 – 2023-04-17 (×2): 100 mg via INTRAVENOUS
  Filled 2023-04-16 (×2): qty 100

## 2023-04-16 MED ORDER — AMISULPRIDE (ANTIEMETIC) 5 MG/2ML IV SOLN: 10.0000 mg | Freq: Once | INTRAVENOUS | Status: DC | PRN

## 2023-04-16 MED ORDER — ACETAMINOPHEN 10 MG/ML IV SOLN
1000.0000 mg | Freq: Once | INTRAVENOUS | Status: DC | PRN
  Administered 2023-04-16: 1000 mg via INTRAVENOUS

## 2023-04-16 MED ORDER — OXYCODONE HCL 5 MG/5ML PO SOLN: 5.0000 mg | Freq: Once | ORAL | Status: DC | PRN

## 2023-04-16 MED ORDER — HYDROMORPHONE HCL 1 MG/ML IJ SOLN
0.2500 mg | INTRAMUSCULAR | Status: DC | PRN
  Administered 2023-04-16: 0.5 mg via INTRAVENOUS

## 2023-04-16 MED ORDER — HYDROMORPHONE HCL 1 MG/ML IJ SOLN
INTRAMUSCULAR | Status: AC
  Administered 2023-04-17: 1 mg
  Filled 2023-04-16: qty 2

## 2023-04-16 MED ORDER — DEXAMETHASONE SODIUM PHOSPHATE 10 MG/ML IJ SOLN
INTRAMUSCULAR | Status: DC | PRN
  Administered 2023-04-16: 10 mg via INTRAVENOUS

## 2023-04-16 MED ORDER — PROPOFOL 10 MG/ML IV BOLUS
INTRAVENOUS | Status: AC
  Filled 2023-04-16: qty 20

## 2023-04-16 MED ORDER — PROPOFOL 10 MG/ML IV BOLUS
INTRAVENOUS | Status: DC | PRN
Start: 2023-04-16 — End: 2023-04-16
  Administered 2023-04-16: 200 mg via INTRAVENOUS

## 2023-04-16 MED ORDER — SUGAMMADEX SODIUM 200 MG/2ML IV SOLN
INTRAVENOUS | Status: DC | PRN
  Administered 2023-04-16: 200 mg via INTRAVENOUS

## 2023-04-16 MED ORDER — BUPIVACAINE-EPINEPHRINE 0.25% -1:200000 IJ SOLN
INTRAMUSCULAR | Status: DC | PRN
  Administered 2023-04-16: 50 mL

## 2023-04-16 MED ORDER — FENTANYL CITRATE (PF) 100 MCG/2ML IJ SOLN
INTRAMUSCULAR | Status: DC | PRN
  Administered 2023-04-16: 100 ug via INTRAVENOUS

## 2023-04-16 MED ORDER — LIDOCAINE HCL (PF) 2 % IJ SOLN
INTRAMUSCULAR | Status: AC
  Filled 2023-04-16: qty 5

## 2023-04-16 MED ORDER — ONDANSETRON HCL 4 MG/2ML IJ SOLN
INTRAMUSCULAR | Status: DC | PRN
  Administered 2023-04-16: 4 mg via INTRAVENOUS

## 2023-04-16 MED ORDER — CEFAZOLIN SODIUM-DEXTROSE 2-4 GM/100ML-% IV SOLN
INTRAVENOUS | Status: AC
  Filled 2023-04-16: qty 100

## 2023-04-16 MED ORDER — BUPIVACAINE-EPINEPHRINE 0.25% -1:200000 IJ SOLN
INTRAMUSCULAR | Status: AC
  Filled 2023-04-16: qty 1

## 2023-04-16 MED ORDER — FENTANYL CITRATE (PF) 100 MCG/2ML IJ SOLN
INTRAMUSCULAR | Status: AC
  Filled 2023-04-16: qty 2

## 2023-04-16 MED ORDER — SODIUM CHLORIDE 0.9 % IV SOLN: INTRAVENOUS | Status: AC

## 2023-04-16 MED ORDER — KETAMINE HCL 10 MG/ML IJ SOLN
INTRAMUSCULAR | Status: DC | PRN
  Administered 2023-04-16: 50 mg via INTRAVENOUS

## 2023-04-16 MED ORDER — ALBUTEROL SULFATE (2.5 MG/3ML) 0.083% IN NEBU: 2.5000 mg | INHALATION_SOLUTION | RESPIRATORY_TRACT | Status: DC | PRN

## 2023-04-16 MED ORDER — ROCURONIUM BROMIDE 100 MG/10ML IV SOLN
INTRAVENOUS | Status: DC | PRN
  Administered 2023-04-16: 60 mg via INTRAVENOUS

## 2023-04-16 MED ORDER — POTASSIUM CHLORIDE 10 MEQ/100ML IV SOLN
10.0000 meq | INTRAVENOUS | Status: AC
  Administered 2023-04-17: 10 meq via INTRAVENOUS
  Filled 2023-04-16: qty 100

## 2023-04-16 MED ORDER — ACETAMINOPHEN 325 MG PO TABS: 650.0000 mg | ORAL_TABLET | Freq: Four times a day (QID) | ORAL | Status: DC | PRN

## 2023-04-16 MED ORDER — ACETAMINOPHEN 650 MG RE SUPP: 650.0000 mg | Freq: Four times a day (QID) | RECTAL | Status: DC | PRN

## 2023-04-16 MED ORDER — KETAMINE HCL 50 MG/5ML IJ SOSY
PREFILLED_SYRINGE | INTRAMUSCULAR | Status: AC
  Filled 2023-04-16: qty 5

## 2023-04-16 MED ORDER — LACTATED RINGERS IV SOLN: INTRAVENOUS | Status: DC | PRN

## 2023-04-16 MED ORDER — ROCURONIUM BROMIDE 10 MG/ML (PF) SYRINGE
PREFILLED_SYRINGE | INTRAVENOUS | Status: AC
  Filled 2023-04-16: qty 10

## 2023-04-16 MED ORDER — ACETAMINOPHEN 10 MG/ML IV SOLN
INTRAVENOUS | Status: AC
  Filled 2023-04-16: qty 100

## 2023-04-16 MED ORDER — MOMETASONE FURO-FORMOTEROL FUM 200-5 MCG/ACT IN AERO
2.0000 | INHALATION_SPRAY | Freq: Two times a day (BID) | RESPIRATORY_TRACT | Status: DC
  Administered 2023-04-17: 2 via RESPIRATORY_TRACT
  Filled 2023-04-16: qty 8.8

## 2023-04-16 MED ORDER — CEFAZOLIN SODIUM-DEXTROSE 2-4 GM/100ML-% IV SOLN
2.0000 g | INTRAVENOUS | Status: AC
  Administered 2023-04-16: 2 g via INTRAVENOUS

## 2023-04-16 MED ORDER — SODIUM CHLORIDE 0.9 % IV BOLUS
1000.0000 mL | Freq: Once | INTRAVENOUS | Status: AC
  Administered 2023-04-16: 1000 mL via INTRAVENOUS

## 2023-04-16 MED ORDER — SUCCINYLCHOLINE CHLORIDE 200 MG/10ML IV SOSY
PREFILLED_SYRINGE | INTRAVENOUS | Status: AC
  Filled 2023-04-16: qty 10

## 2023-04-16 MED ORDER — 0.9 % SODIUM CHLORIDE (POUR BTL) OPTIME
TOPICAL | Status: DC | PRN
  Administered 2023-04-16: 1000 mL

## 2023-04-16 MED ORDER — FENTANYL CITRATE PF 50 MCG/ML IJ SOSY
12.5000 ug | PREFILLED_SYRINGE | INTRAMUSCULAR | Status: DC | PRN
  Administered 2023-04-16: 12.5 ug via INTRAVENOUS
  Administered 2023-04-17: 50 ug via INTRAVENOUS
  Administered 2023-04-17: 25 ug via INTRAVENOUS
  Filled 2023-04-16 (×3): qty 1

## 2023-04-16 MED ORDER — ONDANSETRON HCL 4 MG/2ML IJ SOLN
4.0000 mg | Freq: Once | INTRAMUSCULAR | Status: DC
  Filled 2023-04-16: qty 2

## 2023-04-16 SURGICAL SUPPLY — 37 items
ADH SKN CLS APL DERMABOND .7 (GAUZE/BANDAGES/DRESSINGS) ×2
BAG COUNTER SPONGE SURGICOUNT (BAG) IMPLANT
BAG SPNG CNTER NS LX DISP (BAG)
BINDER ABDOMINAL 12 ML 46-62 (SOFTGOODS) IMPLANT
BLADE HEX COATED 2.75 (ELECTRODE) ×1 IMPLANT
DERMABOND ADVANCED .7 DNX12 (GAUZE/BANDAGES/DRESSINGS) IMPLANT
DRAIN CHANNEL RND F F (WOUND CARE) IMPLANT
DRAPE LAPAROSCOPIC ABDOMINAL (DRAPES) ×1 IMPLANT
ELECT REM PT RETURN 15FT ADLT (MISCELLANEOUS) ×1 IMPLANT
EVACUATOR SILICONE 100CC (DRAIN) IMPLANT
GAUZE SPONGE 4X4 12PLY STRL (GAUZE/BANDAGES/DRESSINGS) ×1 IMPLANT
GLOVE BIOGEL PI IND STRL 7.0 (GLOVE) ×1 IMPLANT
GLOVE INDICATOR 8.0 STRL GRN (GLOVE) ×2 IMPLANT
GLOVE SS BIOGEL STRL SZ 7.5 (GLOVE) ×1 IMPLANT
GOWN STRL REUS W/ TWL XL LVL3 (GOWN DISPOSABLE) ×2 IMPLANT
GOWN STRL REUS W/TWL XL LVL3 (GOWN DISPOSABLE) ×2
KIT BASIN OR (CUSTOM PROCEDURE TRAY) ×1 IMPLANT
KIT TURNOVER KIT A (KITS) IMPLANT
NDL HYPO 25X1 1.5 SAFETY (NEEDLE) IMPLANT
NEEDLE HYPO 25X1 1.5 SAFETY (NEEDLE) ×1 IMPLANT
NS IRRIG 1000ML POUR BTL (IV SOLUTION) ×1 IMPLANT
PACK GENERAL/GYN (CUSTOM PROCEDURE TRAY) ×1 IMPLANT
SPIKE FLUID TRANSFER (MISCELLANEOUS) IMPLANT
SPONGE T-LAP 4X18 ~~LOC~~+RFID (SPONGE) IMPLANT
STAPLER VISISTAT 35W (STAPLE) ×1 IMPLANT
SUT ETHILON 3 0 PS 1 (SUTURE) IMPLANT
SUT NOVA NAB GS-21 0 18 T12 DT (SUTURE) IMPLANT
SUT PROLENE 0 CT 1 CR/8 (SUTURE) IMPLANT
SUT SILK 3 0 (SUTURE)
SUT SILK 3-0 18XBRD TIE 12 (SUTURE) IMPLANT
SUT VIC AB 2-0 CT1 27 (SUTURE)
SUT VIC AB 2-0 CT1 27XBRD (SUTURE) IMPLANT
SUT VIC AB 3-0 SH 27 (SUTURE) ×2
SUT VIC AB 3-0 SH 27XBRD (SUTURE) IMPLANT
SYR CONTROL 10ML LL (SYRINGE) IMPLANT
TOWEL OR 17X26 10 PK STRL BLUE (TOWEL DISPOSABLE) ×1 IMPLANT
TRAY FOLEY MTR SLVR 16FR STAT (SET/KITS/TRAYS/PACK) IMPLANT

## 2023-04-16 NOTE — ED Triage Notes (Signed)
Pt reports abdominal pain x2 days. Was diagnosed with a bowel obstruction last night before he left AMA.Was told he needs surgery.

## 2023-04-16 NOTE — ED Provider Notes (Signed)
[place] EMERGENCY DEPARTMENT AT [facility] Provider Note   CSN: [id number] Arrival date & time: 04/16/23  1644     History  Chief Complaint  Patient presents with   Abdominal Pain    Peter Thompson is a 57 y.o. male.  Pt is a 57 yo male with pmhx significant for htn, arthritis, djd, afib (on eliquis), and arthritis.  Pt was seen in the ED at [facility] yesterday for increased abd pain and n/v.  He was told that he had a SBO and needed surgery.  He signed out AMA because he said they stuck him too much.    CT from yesterday:  Loop of distal small bowel within a moderate-sized umbilical hernia with associated small bowel obstruction.  Changes of cirrhosis. Small amount of ascites.  Aortoiliac atherosclerosis.  Small pericardial effusion.         Home Medications Prior to Admission medications   Medication Sig Start Date End Date Taking? Authorizing Provider  ALPRAZolam Prudy Feeler) 0.5 MG tablet Take 1 mg by mouth at bedtime.   Yes [provider]  amLODipine (NORVASC) 10 MG tablet Take 10 mg by mouth daily.   Yes [provider]  budesonide-formoterol (SYMBICORT) 160-4.5 MCG/ACT inhaler Inhale 2 puffs into the lungs 2 (two) times daily as needed (for flares).   Yes [provider]  cloNIDine (CATAPRES) 0.2 MG tablet Take 0.2 mg by mouth 3 (three) times daily.   Yes [provider]  doxycycline (ADOXA) 100 MG tablet Take 100 mg by mouth 2 (two) times daily. 04/03/23 04/18/23 Yes [provider]  doxycycline (VIBRA-TABS) 100 MG tablet Take 1 tablet (100 mg total) by mouth 2 (two) times daily. 04/03/23  Yes Odette Fraction, MD  ELIQUIS 5 MG TABS tablet TAKE ONE TABLET BY MOUTH TWICE DAILY 04/03/23  Yes Georgeanna Lea, MD  furosemide (LASIX) 80 MG tablet Take 80 mg by mouth in the morning.   Yes [provider]  labetalol (NORMODYNE) 300 MG tablet Take 1 tablet (300 mg total) by mouth 2 (two) times  daily. 10/04/18  Yes Elodia Florence, PA-C  Multiple Vitamins-Minerals (MULTIVITAMIN WITH MINERALS) tablet Take 1 tablet by mouth daily.   Yes [provider]  oxyCODONE 20 MG TABS Take 1 tablet (20 mg total) by mouth every 4 (four) hours as needed (severe pain). Patient taking differently: Take 20 mg by mouth every 4 (four) hours. 10/04/18  Yes Elodia Florence, PA-C  potassium chloride (KLOR-CON) 10 MEQ tablet Take 10 mEq by mouth daily. 04/25/19  Yes [provider]  triamcinolone ointment (KENALOG) 0.5 % Apply 1 Application topically 2 (two) times daily. 03/31/23  Yes Vu, Tonita Phoenix, MD  pantoprazole (PROTONIX) 40 MG tablet Take 1 tablet (40 mg total) by mouth daily. Patient not taking: Reported on 03/23/2023 02/16/23   Jonah Blue, MD  [facility] HFA [phone] Base) MCG/ACT inhaler Inhale 2 puffs into the lungs 4 (four) times daily as needed for wheezing or shortness of breath. 12/29/20   [provider]      Allergies    Patient has no known allergies.    Review of Systems   Review of Systems  Gastrointestinal:  Positive for abdominal distention, abdominal pain and nausea.  All other systems reviewed and are negative.   Physical Exam Updated Vital Signs BP 107/64   Pulse 79   Temp 98.1 F (36.7 C) (Oral)   Resp 12   Ht 5\' 10"  (1.778 m)  Wt 102.5 kg   SpO2 96%   BMI 32.43 kg/m  Physical Exam Vitals and nursing note reviewed.  Constitutional:      Appearance: He is well-developed.  HENT:     Head: Normocephalic and atraumatic.     Mouth/Throat:     Mouth: Mucous membranes are moist.     Pharynx: Oropharynx is clear.  Eyes:     Extraocular Movements: Extraocular movements intact.     Pupils: Pupils are equal, round, and reactive to light.  Cardiovascular:     Rate and Rhythm: Normal rate. Rhythm irregular.     Heart sounds: Normal heart sounds.  Pulmonary:     Effort: Pulmonary effort is normal.     Breath sounds: Normal breath sounds.  Abdominal:      General: Bowel sounds are decreased. There is distension.     Palpations: Abdomen is soft. There is fluid wave.     Tenderness: There is abdominal tenderness in the periumbilical area.     Hernia: A hernia is present. Hernia is present in the umbilical area.     Comments: Hernia is red and swollen.  Non-reducible.  Genitourinary:    Testes:        Right: Swelling present.        Left: Swelling present.  Skin:    General: Skin is warm.     Capillary Refill: Capillary refill takes less than 2 seconds.     Comments: Bilateral LE venous stasis  Neurological:     General: No focal deficit present.     Mental Status: He is alert and oriented to person, place, and time.  Psychiatric:        Mood and Affect: Mood normal.        Behavior: Behavior normal.     ED Results / Procedures / Treatments   Labs (all labs ordered are listed, but only abnormal results are displayed) Labs Reviewed  CBC WITH DIFFERENTIAL/PLATELET - Abnormal; Notable for the following components:      Result Value   Hemoglobin 12.5 (*)    HCT 38.2 (*)    All other components within normal limits  COMPREHENSIVE METABOLIC PANEL - Abnormal; Notable for the following components:   Sodium 132 (*)    Potassium 3.3 (*)    Chloride 93 (*)    Glucose, Bld 122 (*)    Calcium 8.8 (*)    All other components within normal limits  LIPASE, BLOOD  PROTIME-INR  AMMONIA  URINALYSIS, ROUTINE W REFLEX MICROSCOPIC  I-STAT CG4 LACTIC ACID, ED    EKG EKG Interpretation Date/Time:  Sunday April 16 2023 17:43:55 EDT Ventricular Rate:  86 PR Interval:    QRS Duration:  88 QT Interval:  443 QTC Calculation: 530 R Axis:   59  Text Interpretation: Atrial fibrillation Ventricular premature complex Low voltage, precordial leads Abnormal T, consider ischemia, lateral leads Prolonged QT interval No significant change since last tracing Confirmed by Jacalyn Lefevre [phone]) on 04/16/2023 6:15:14 PM  Radiology No results  found.  Procedures Procedures    Medications Ordered in ED Medications  ondansetron (ZOFRAN) injection 4 mg (4 mg Intravenous Patient Refused/Not Given 04/16/23 1750)  morphine (PF) 4 MG/ML injection 4 mg (4 mg Intravenous Not Given 04/16/23 1737)  sodium chloride 0.9 % bolus 1,000 mL (0 mLs Intravenous Stopped 04/16/23 1931)    ED Course/ Medical Decision Making/ A&P  Medical Decision Making Amount and/or Complexity of Data Reviewed Labs: ordered.  Risk Prescription drug management. Decision regarding hospitalization.   This patient presents to the ED for concern of abd pain, this involves an extensive number of treatment options, and is a complaint that carries with it a high risk of complications and morbidity.  The differential diagnosis includes sbo, electrolyte abn   Co morbidities that complicate the patient evaluation  htn, arthritis, djd, afib (on eliquis), and arthritis   Additional history obtained:  Additional history obtained from epic chart review External records from outside source obtained and reviewed including family   Lab Tests:  I Ordered, and personally interpreted labs.  The pertinent results include:  lactic nl, cbc nl except for mild anemia (hgb 12.5 which is stable); cmp nl other than mild hypokalemia (k 3.3)   Imaging Studies ordered:   I agree with the radiologist interpretation   Cardiac Monitoring:  The patient was maintained on a cardiac monitor.  I personally viewed and interpreted the cardiac monitored which showed an underlying rhythm of: afib   Medicines ordered and prescription drug management:  I ordered medication including ivfs  for sx  Reevaluation of the patient after these medicines showed that the patient improved I have reviewed the patients home medicines and have made adjustments as needed   Test Considered:  ct   Critical Interventions:  Surgery consult   Consultations  Obtained:  I requested consultation with the surgeon (Dr. Luisa Hart),  and discussed lab and imaging findings as well as pertinent plan -he will see pt in consult.   Pt d/w Dr. Adela Glimpse ([facility]) for admission.   Problem List / ED Course:  Sbo:  lactic nl.  Pt is not vomiting.  I will hold off on NG. Afib:  last eliquis dose yesterday am (wife said he's also not been taking it bid as rx'd, but just once a day)   Reevaluation:  After the interventions noted above, I reevaluated the patient and found that they have :improved   Social Determinants of Health:  Lives at home   Dispostion:  After consideration of the diagnostic results and the patients response to treatment, I feel that the patent would benefit from admission.          Final Clinical Impression(s) / ED Diagnoses Final diagnoses:  Small bowel obstruction (HCC)  Umbilical hernia with obstruction  Atrial fibrillation, permanent [facility])    Rx / DC Orders ED Discharge Orders     None         Jacalyn Lefevre, MD 04/16/23 1935

## 2023-04-16 NOTE — Assessment & Plan Note (Signed)
Allow permissive hypertension 

## 2023-04-16 NOTE — Assessment & Plan Note (Signed)
Hold Eliquis as patient may need surgical intervention Hold labetalol for now given hypotension

## 2023-04-16 NOTE — Anesthesia Postprocedure Evaluation (Signed)
Anesthesia Post Note  Patient: Peter Thompson  Procedure(s) Performed: HERNIA REPAIR UMBILICAL ADULT     Patient location during evaluation: PACU Anesthesia Type: General Level of consciousness: awake Pain management: pain level controlled Vital Signs Assessment: post-procedure vital signs reviewed and stable Respiratory status: spontaneous breathing, nonlabored ventilation and respiratory function stable Cardiovascular status: blood pressure returned to baseline and stable Postop Assessment: no apparent nausea or vomiting Anesthetic complications: no   No notable events documented.  Last Vitals:  Vitals:   04/16/23 2215 04/16/23 2232  BP: (!) 150/93 (!) 134/99  Pulse: 95 (!) 45  Resp: 18 18  Temp:    SpO2: 98% 96%    Last Pain:  Vitals:   04/16/23 2232  TempSrc:   PainSc: 0-No pain                 Linton Rump

## 2023-04-16 NOTE — ED Notes (Signed)
Upon entering pt room, pt is lethargic and slurring his words. Pt is not waking up to hold a conversation. Morphine will not be given. Dr. Particia Nearing notified

## 2023-04-16 NOTE — Assessment & Plan Note (Signed)
Hold off of CPAP for now given SBO

## 2023-04-16 NOTE — Anesthesia Preprocedure Evaluation (Addendum)
Anesthesia Evaluation  Patient identified by MRN, date of birth, ID band Patient awake    Reviewed: Allergy & Precautions, NPO status , Patient's Chart, lab work & pertinent test results  History of Anesthesia Complications (+) history of anesthetic complications (woke up when IV came out)  Airway Mallampati: III  TM Distance: >3 FB Neck ROM: Limited  Mouth opening: Limited Mouth Opening Comment: Previous grade II view with MAC 4, easy mask Dental  (+) Dental Advisory Given,    Pulmonary neg shortness of breath, sleep apnea and Continuous Positive Airway Pressure Ventilation , neg recent URI, former smoker   Pulmonary exam normal breath sounds clear to auscultation       Cardiovascular hypertension (amlodipine, clonidine, furosemide, labetalol), Pt. on medications (-) angina + Peripheral Vascular Disease  (-) Past MI, (-) Cardiac Stents and (-) CABG + dysrhythmias Atrial Fibrillation  Rhythm:Irregular Rate:Normal  TTE 10/26/2021: IMPRESSIONS     1. Left ventricular ejection fraction, by estimation, is 60 to 65%. The  left ventricle has normal function. The left ventricle has no regional  wall motion abnormalities. Left ventricular diastolic parameters are  indeterminate.   2. Right ventricular systolic function is normal. The right ventricular  size is normal. There is mildly elevated pulmonary artery systolic  pressure.   3. Left atrial size was moderately dilated.   4. The mitral valve is normal in structure. No evidence of mitral valve  regurgitation. No evidence of mitral stenosis.   5. The aortic valve is normal in structure. Aortic valve regurgitation is  not visualized. No aortic stenosis is present.   6. The inferior vena cava is normal in size with greater than 50%  respiratory variability, suggesting right atrial pressure of 3 mmHg.     Neuro/Psych  Headaches, neg Seizures PSYCHIATRIC DISORDERS Anxiety         GI/Hepatic ,GERD  Medicated,,(+) Cirrhosis         Endo/Other  negative endocrine ROS    Renal/GU negative Renal ROS     Musculoskeletal  (+) Arthritis ,    Abdominal  (+) + obese  Peds  Hematology  (+) Blood dyscrasia, anemia   Anesthesia Other Findings 57 year old male who presents emergency room with incarcerated local hernia.  He was seen yesterday at [facility] but left AMA.  Last Eliquis: 8/2  Ate blueberries at 3. Drank tea 1 hour ago.  Reproductive/Obstetrics                             Anesthesia Physical Anesthesia Plan  ASA: 3 and emergent  Anesthesia Plan: General   Post-op Pain Management:    Induction: Intravenous and Rapid sequence  PONV Risk Score and Plan: 2 and Ondansetron, Dexamethasone and Treatment may vary due to age or medical condition  Airway Management Planned: Oral ETT  Additional Equipment:   Intra-op Plan:   Post-operative Plan: Extubation in OR  Informed Consent: I have reviewed the patients History and Physical, chart, labs and discussed the procedure including the risks, benefits and alternatives for the proposed anesthesia with the patient or authorized representative who has indicated his/her understanding and acceptance.     Dental advisory given  Plan Discussed with: CRNA and Anesthesiologist  Anesthesia Plan Comments: (Risks of general anesthesia discussed including, but not limited to, sore throat, hoarse voice, chipped/damaged teeth, injury to vocal cords, nausea and vomiting, allergic reactions, lung infection, heart attack, stroke, and death. All questions answered. )  Anesthesia Quick Evaluation

## 2023-04-16 NOTE — Anesthesia Procedure Notes (Signed)
Procedure Name: Intubation Date/Time: 04/16/2023 8:59 PM  Performed by: [place], Clydene Pugh, CRNAPre-anesthesia Checklist: Patient identified, Emergency Drugs available, Suction available and Patient being monitored Patient Re-evaluated:Patient Re-evaluated prior to induction Oxygen Delivery Method: Circle system utilized Preoxygenation: Pre-oxygenation with 100% oxygen Induction Type: IV induction, Rapid sequence and Cricoid Pressure applied Laryngoscope Size: Glidescope, 4 and Mac Grade View: Grade I Tube type: Oral Tube size: 7.5 mm Number of attempts: 1 Airway Equipment and Method: Oral airway and Rigid stylet Placement Confirmation: ETT inserted through vocal cords under direct vision, positive ETCO2 and breath sounds checked- equal and bilateral Secured at: 22 cm Tube secured with: Tape Dental Injury: Teeth and Oropharynx as per pre-operative assessment  Comments: Elective GS due to limited neck mobility and patient is not NPO

## 2023-04-16 NOTE — Assessment & Plan Note (Signed)
Obtain anemia panel  Transfuse for H <7 or if symptomatic

## 2023-04-16 NOTE — Op Note (Signed)
Preoperative diagnosis: Incarcerated umbilical hernia with possible small bowel obstruction 3 cm  Postoperative diagnosis: Same  Procedure: Primary repair of umbilical hernia 3 cm  Surgeon: Harriette Bouillon, MD  Anesthesia: General with 0.25% Marcaine with epinephrine  EBL: Minimal  Specimen: None  Drains: None  Indications for procedure: The patient is a 57 year old male who was seen yesterday at [facility] with an incarcerated umbilical hernia.  Attempts were made to reduce it.  The patient left AMA.  He comes in today with a history of 2 days of abdominal pain.  He has had 1 bout of nausea and vomiting.  Complains of pain and redness at the umbilicus.  CT scan was reviewed which showed incarcerated umbilical hernia and possible small bowel obstruction.   He presents to the operating room for emergent repair of his incarcerated umbilical hernia.  Risks and benefits of surgery reviewed.  Techniques of repair reviewed.  The use of mesh reviewed.  The patient stated he did not wish to have any mesh placed therefore we discussed a primary repair in the setting.  I explained that this will part breakdown over time but he voices understanding.  Risk of bleeding, infection, bowel injury, bowel resection, cardiovascular event, recurrence, recurrent obstruction were all reviewed.    Description of procedure: The patient was met in the holding area and questions were answered.  He was taken back to the operating room placed supine upon the operating room table.  After induction of general anesthesia, the periumbilical region was prepped and draped in a sterile fashion and timeout performed.  Proper patient, site and procedure verified.  A curvilinear incision was made along the base of the umbilicus.  Local anesthetic infiltrated.  We elevated the skin off the hernia sac.  The hernia sac was opened and small bowel was then this but it was reduced back into the abdominal cavity and was viable without  signs of ischemia.  There was some ascites and this was suctioned out.  I resected this chronic hernia sac with cautery.  This was cut down to the fascial edge.  The defect measured approximately 3 cm in maximal diameter.  I closed this primarily with 0 Novafil pop-off sutures.  We then closed the subcutaneous space with 3-0 Vicryl.  4-0 Monocryl was used to close the skin in a subcuticular fashion.  Dermabond applied.  Abdominal binder placed.  Bulky dressing applied.  All counts were found to be correct.  The patient was awoke extubated taken to recovery in satisfactory condition.

## 2023-04-16 NOTE — Subjective & Objective (Signed)
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

## 2023-04-16 NOTE — Assessment & Plan Note (Addendum)
CT scan worrisome for cirrhosis with minimal ascites.    Continue to follow labs Pt states he stopped drinking

## 2023-04-16 NOTE — Assessment & Plan Note (Signed)
--   Follow up as an outpatient 

## 2023-04-16 NOTE — ED Notes (Signed)
CHG bath complete, pt placed in gown, pt belongings sent with pt wife

## 2023-04-16 NOTE — Assessment & Plan Note (Signed)
Eliquis on hold patient has been off Plavix

## 2023-04-16 NOTE — Assessment & Plan Note (Addendum)
Likely cause umbilical hernia   - NPO  - appreciate General surgery consult. Hold Eliquis in anticipation for surgical intervention 8:34 PM Was seen by General surgery in ER and plan was to take to OR emergently May need to adjust level of care pending status post op

## 2023-04-16 NOTE — Assessment & Plan Note (Addendum)
Patient was supposed to finish antibiotics as an outpatient now on doxycycline 100 mg po BID will change to IV

## 2023-04-16 NOTE — Consult Note (Signed)
Reason for Consult: Incarcerated umbilical hernia Referring Physician: Particia Nearing MD  Peter Thompson is an 57 y.o. male.  HPI: 57 year old male who presents emergency room with incarcerated local hernia.  He was seen yesterday at [facility] but left AMA.  He stated that they tried to reduce it but it made her knee much more painful and red now.  He has had no vomiting today.  He had 1 episode of vomiting yesterday.  This been hurting since Thursday.  Hernias been present for over a year.  This is the first time it has been red painful and not reducible he states.  I reviewed his CT scan from [facility] which shows small bowel dilation as well as a large stool burden.  I do not see a transition point in the hernia itself after reviewing myself.  His lactate and white count are within normal limits.  He has multiple medical problems. He has not had his blood thinner for over 2 days he states. Past Medical History:  Diagnosis Date   Abnormal results of liver function studies 01/21/2021   Anemia 10/17/2018   Anxiety    Arthritis    Bursitis    Cervicogenic headache 10/06/2020   Complication of anesthesia    "woke up in OR- IV came out."   Concussion    several- head injury- was in milatary   DJD (degenerative joint disease)    Essential hypertension 10/17/2018   History of arthroscopic procedure on shoulder 01/21/2021   Hypertension    Insomnia    Left-sided epistaxis 01/30/2019   Low back pain 01/21/2021   Neck fracture (HCC)    Obesity 01/21/2021   Obstructive sleep apnea 09/04/2019   Osteoarthrosis, unspecified whether generalized or localized, other specified sites    Other and unspecified hyperlipidemia 01/21/2021   Other specified counseling 01/21/2021   Other, mixed, or unspecified nondependent drug abuse, unspecified 01/21/2021   Paroxysmal atrial fibrillation (HCC) 12/26/2018   Persistent atrial fibrillation (HCC)    Pre-op evaluation 12/05/2016   Primary localized osteoarthritis of  left hip 05/10/2016   Primary osteoarthritis of left hip 05/10/2016   Primary osteoarthritis of right knee 10/02/2018   Psychosocial circumstance 01/21/2021   Smoking 12/05/2016   Tinnitus, bilateral     Past Surgical History:  Procedure Laterality Date   ANKLE SURGERY Left    Arm surgery Right    fracture repair   CERVICAL SPINE SURGERY     bone graft from left hip   HAND SURGERY Left    drains for Infection   HIP SURGERY Left    "scrapped" x2   KNEE ARTHROSCOPY Right    KNEE ARTHROSCOPY Right 02/14/2023   Procedure: RIGHT KNEE ARTHROSCOPY WITH IRRIGATION AND DEBRIDEMENT;  Surgeon: Marcene Corning, MD;  Location: [facility];  Service: Orthopedics;  Laterality: Right;   LEG SURGERY Left    "1 inch took out"   SHOULDER ARTHROSCOPY Left 12/27/2016   Procedure: ARTHROSCOPY SHOULDER;  Surgeon: Marcene Corning, MD;  Location: [facility] OR;  Service: Orthopedics;  Laterality: Left;  Debridement, AC Decompression, Acromioplasty    TOTAL HIP ARTHROPLASTY Left 05/10/2016   Procedure: TOTAL HIP ARTHROPLASTY ANTERIOR APPROACH;  Surgeon: Marcene Corning, MD;  Location: [facility] OR;  Service: Orthopedics;  Laterality: Left;   TOTAL KNEE ARTHROPLASTY Right 10/02/2018   Procedure: TOTAL KNEE ARTHROPLASTY;  Surgeon: Marcene Corning, MD;  Location: [facility] OR;  Service: Orthopedics;  Laterality: Right;   WRIST SURGERY Right    2 Pinns    Family History  Problem Relation Age of Onset   Cancer Other    Heart disease Neg Hx    Headache Neg Hx    Migraines Neg Hx     Social History:  reports that he quit smoking about 2 years ago. His smoking use included cigarettes. He has quit using smokeless tobacco.  His smokeless tobacco use included chew. He reports current alcohol use of about 12.0 standard drinks of alcohol per week. He reports that he does not use drugs.  Allergies: No Known Allergies  Medications: I have reviewed the patient's current medications.  Results for orders placed or performed during the hospital  encounter of 04/16/23 (from the past 48 hour(s))  CBC with Differential     Status: Abnormal   Collection Time: 04/16/23  5:46 PM  Result Value Ref Range   WBC 8.6 4.0 - 10.5 K/uL   RBC 4.34 4.22 - 5.81 MIL/uL   Hemoglobin 12.5 (L) 13.0 - 17.0 g/dL   HCT 52.8 (L) 41.3 - 24.4 %   MCV 88.0 80.0 - 100.0 fL   MCH 28.8 26.0 - 34.0 pg   MCHC 32.7 30.0 - 36.0 g/dL   RDW 01.0 27.2 - 53.6 %   Platelets 314 150 - 400 K/uL   nRBC 0.0 0.0 - 0.2 %   Neutrophils Relative % 68 %   Neutro Abs 5.9 1.7 - 7.7 K/uL   Lymphocytes Relative 14 %   Lymphs Abs 1.2 0.7 - 4.0 K/uL   Monocytes Relative 11 %   Monocytes Absolute 0.9 0.1 - 1.0 K/uL   Eosinophils Relative 6 %   Eosinophils Absolute 0.5 0.0 - 0.5 K/uL   Basophils Relative 1 %   Basophils Absolute 0.0 0.0 - 0.1 K/uL   Immature Granulocytes 0 %   Abs Immature Granulocytes 0.02 0.00 - 0.07 K/uL    Comment: Performed at [facility], [address] [place], [place] [address]  Comprehensive metabolic panel     Status: Abnormal   Collection Time: 04/16/23  5:46 PM  Result Value Ref Range   Sodium 132 (L) 135 - 145 mmol/L   Potassium 3.3 (L) 3.5 - 5.1 mmol/L   Chloride 93 (L) 98 - 111 mmol/L   CO2 29 22 - 32 mmol/L   Glucose, Bld 122 (H) 70 - 99 mg/dL    Comment: Glucose reference range applies only to samples taken after fasting for at least 8 hours.   BUN 10 6 - 20 mg/dL   Creatinine, Ser 4.74 0.61 - 1.24 mg/dL   Calcium 8.8 (L) 8.9 - 10.3 mg/dL   Total Protein 6.8 6.5 - 8.1 g/dL   Albumin 3.7 3.5 - 5.0 g/dL   AST 24 15 - 41 U/L   ALT 20 0 - 44 U/L   Alkaline Phosphatase 107 38 - 126 U/L   Total Bilirubin 1.2 0.3 - 1.2 mg/dL   GFR, Estimated >25 >95 mL/min    Comment: (NOTE) Calculated using the CKD-EPI Creatinine Equation (2021)    Anion gap 10 5 - 15    Comment: Performed at [facility], [address], [place], [place] [address]  Lipase, blood     Status: None   Collection Time: 04/16/23   5:46 PM  Result Value Ref Range   Lipase 26 11 - 51 U/L    Comment: Performed at [facility], [address] [place], [place] [address]  I-Stat CG4 Lactic Acid     Status: None  Collection Time: 04/16/23  5:57 PM  Result Value Ref Range   Lactic Acid, Venous 1.3 0.5 - 1.9 mmol/L  Protime-INR     Status: None   Collection Time: 04/16/23  6:06 PM  Result Value Ref Range   Prothrombin Time 15.1 11.4 - 15.2 seconds   INR 1.2 0.8 - 1.2    Comment: (NOTE) INR goal varies based on device and disease states. Performed at [facility], [address] [place], [place] [address]   Ammonia     Status: None   Collection Time: 04/16/23  6:06 PM  Result Value Ref Range   Ammonia 33 9 - 35 umol/L    Comment: Performed at [facility], [address] [place], [place] [address]    No results found.  Review of Systems  HENT: Negative.    Respiratory:  Positive for shortness of breath.   Cardiovascular:  Positive for leg swelling.  Gastrointestinal:  Positive for abdominal pain and nausea.  Genitourinary: Negative.   Musculoskeletal:  Positive for arthralgias, back pain, gait problem and joint swelling.  Hematological:  Bruises/bleeds easily.   Blood pressure 120/85, pulse 81, temperature 98.1 F (36.7 C), temperature source Oral, resp. rate 15, height 5\' 10"  (1.778 m), weight 102.5 kg, SpO2 98%. Physical Exam HENT:     Head: Normocephalic.  Abdominal:     General: Abdomen is protuberant.     Tenderness: There is abdominal tenderness.     Hernia: A hernia is present. Hernia is present in the umbilical area.       Comments: Erythema incarcerated local hernia  Skin:    General: Skin is warm.  Neurological:     Mental Status: He is alert.     Assessment/Plan: Incarcerated umbilical hernia  Questionable small bowel obstruction but no vomiting  Given concern for possible bowel obstruction and erythema and nonreducible  state of hernia, recommend emergent repair.  He agrees to proceed.  Risks and benefits of hernia surgery reviewed.  We discussed the use of mesh the patient does not want to have mesh placed.  I explained he may have a high recurrence rate but he is fine with that.  Plan will be for primary repair of umbilical hernia.  Risk of bleeding, infection, small bowel resection, open surgery, complications of bowel resection, stricture, leak, and need for the treatment center procedures.  He has not had any blood thinner for 8 hours so I do not think he needs blood products reversal of this anticoagulation.     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   Gram-negative bacteremia 02/11/2023   Peripheral vascular disease (HCC) 02/11/2023   Septic arthritis of knee (HCC) 02/10/2023   Chronic tension-type headache, intractable 07/11/2022   Peripheral vascular disease, unspecified (HCC) 04/21/2022   Sensorineural hearing loss, bilateral 10/21/2021   Venous stasis dermatitis of both lower extremities 09/07/2021   Tinea pedis of both feet 09/07/2021   Contusion of left foot 09/07/2021   Umbilical hernia without obstruction and without gangrene 03/04/2021   Abnormal results of liver function studies 01/21/2021   Acute gastritis 01/21/2021   History of arthroscopic procedure on shoulder 01/21/2021   Low back pain 01/21/2021   Other and unspecified hyperlipidemia 01/21/2021   Obesity 01/21/2021   Other specified counseling 01/21/2021   Other, mixed, or  unspecified nondependent drug abuse, unspecified 01/21/2021   Psychosocial circumstance 01/21/2021   Concussion    Arthritis    Cervicogenic headache 10/06/2020   Permanent atrial fibrillation (HCC) 07/17/2020   Tinnitus, bilateral    Neck fracture (HCC)    Insomnia     Hypertension    DJD (degenerative joint disease)    Complication of anesthesia    Bursitis    Osteoarthrosis, unspecified whether generalized or localized, other specified sites    Anxiety    Obstructive sleep apnea (adult) (pediatric) 09/04/2019   Obstructive sleep apnea 09/04/2019   Left-sided epistaxis 01/30/2019   Paroxysmal atrial fibrillation (HCC) 12/26/2018   Essential (primary) hypertension 10/17/2018   Anemia 10/17/2018   Essential hypertension 10/17/2018   Persistent atrial fibrillation (HCC)    Primary osteoarthritis of right knee 10/02/2018   Pre-op evaluation 12/05/2016   Smoking 12/05/2016   Primary localized osteoarthritis of left hip 05/10/2016   Primary osteoarthritis of left hip 05/10/2016     Clovis Pu   MD  04/16/2023, 7:51 PM     High complexity

## 2023-04-16 NOTE — ED Notes (Signed)
ED TO INPATIENT HANDOFF REPORT  ED Nurse Name and Phone #: Thamas Jaegers Name/Age/Gender Peter Thompson 58 y.o. male Room/Bed: WOTF/NONE  Code Status   Code Status: Full Code  Home/SNF/Other Home Patient oriented to: self, place, time, and situation Is this baseline? Yes   Triage Complete: Triage complete  Chief Complaint SBO (small bowel obstruction) (HCC) [K56.609]  Triage Note Pt reports abdominal pain x2 days. Was diagnosed with a bowel obstruction last night before he left AMA.Was told he needs surgery.   Allergies No Known Allergies  Level of Care/Admitting Diagnosis ED Disposition     ED Disposition  Admit   Condition  --   Comment  Hospital Area: [facility] [[id number]]  Level of Care: Progressive [102]  Admit to Progressive based on following criteria: MULTISYSTEM THREATS such as stable sepsis, metabolic/electrolyte imbalance with or without encephalopathy that is responding to early treatment.  May admit patient to [facility] or [facility] if equivalent level of care is available:: No  Covid Evaluation: Asymptomatic - no recent exposure (last 10 days) testing not required  Diagnosis: SBO (small bowel obstruction) [facility]) [[id number]]  Admitting Physician: Therisa Doyne [[id number]]  Attending Physician: Therisa Doyne [[id number]]  Certification:: I certify this patient will need inpatient services for at least 2 midnights  Estimated Length of Stay: 2          B Medical/Surgery History Past Medical History:  Diagnosis Date   Abnormal results of liver function studies 01/21/2021   Anemia 10/17/2018   Anxiety    Arthritis    Bursitis    Cervicogenic headache 10/06/2020   Complication of anesthesia    "woke up in OR- IV came out."   Concussion    several- head injury- was in mil[facility]ary   DJD (degenerative joint disease)    Essential hypertension 10/17/2018   History of arthroscopic procedure on shoulder 01/21/2021   Hypertension    Insomnia     Left-sided epistaxis 01/30/2019   Low back pain 01/21/2021   Neck fracture (HCC)    Obesity 01/21/2021   Obstructive sleep apnea 09/04/2019   Osteoarthrosis, unspecified whether generalized or localized, other specified sites    Other and unspecified hyperlipidemia 01/21/2021   Other specified counseling 01/21/2021   Other, mixed, or unspecified nondependent drug abuse, unspecified 01/21/2021   Paroxysmal atrial fibrillation (HCC) 12/26/2018   Persistent atrial fibrillation (HCC)    Pre-op evaluation 12/05/2016   Primary localized osteoarthritis of left hip 05/10/2016   Primary osteoarthritis of left hip 05/10/2016   Primary osteoarthritis of right knee 10/02/2018   Psychosocial circumstance 01/21/2021   Smoking 12/05/2016   Tinnitus, bilateral    Past Surgical History:  Procedure Laterality Date   ANKLE SURGERY Left    Arm surgery Right    fracture repair   CERVICAL SPINE SURGERY     bone graft from left hip   HAND SURGERY Left    drains for Infection   HIP SURGERY Left    "scrapped" x2   KNEE ARTHROSCOPY Right    KNEE ARTHROSCOPY Right 02/14/2023   Procedure: RIGHT KNEE ARTHROSCOPY WITH IRRIGATION AND DEBRIDEMENT;  Surgeon: Marcene Corning, MD;  Location: [facility];  Service: Orthopedics;  Laterality: Right;   LEG SURGERY Left    "1 inch took out"   SHOULDER ARTHROSCOPY Left 12/27/2016   Procedure: ARTHROSCOPY SHOULDER;  Surgeon: Marcene Corning, MD;  Location: [facility] OR;  Service: Orthopedics;  Laterality: Left;  Debridement, AC Decompression, Acromioplasty    TOTAL  HIP ARTHROPLASTY Left 05/10/2016   Procedure: TOTAL HIP ARTHROPLASTY ANTERIOR APPROACH;  Surgeon: Marcene Corning, MD;  Location: [facility] OR;  Service: Orthopedics;  Laterality: Left;   TOTAL KNEE ARTHROPLASTY Right 10/02/2018   Procedure: TOTAL KNEE ARTHROPLASTY;  Surgeon: Marcene Corning, MD;  Location: [facility] OR;  Service: Orthopedics;  Laterality: Right;   WRIST SURGERY Right    2 Pinns     A IV Location/Drains/Wounds Patient  Lines/Drains/Airways Status     Active Line/Drains/Airways     Name Placement date Placement time Site Days   Peripheral IV 04/16/23 20 G Left;Posterior Forearm 04/16/23  1748  Forearm  less than 1   PICC Single Lumen 02/15/23 Right Brachial 41 cm 0 cm 02/15/23  1535  Brachial  60   Negative Pressure Wound Therapy Knee Right 02/14/23  1437  --  61            Intake/Output Last 24 hours  Intake/Output Summary (Last 24 hours) at 04/16/2023 2041 Last data filed at 04/16/2023 1931 Gross per 24 hour  Intake 1000 ml  Output --  Net 1000 ml    Labs/Imaging Results for orders placed or performed during the hospital encounter of 04/16/23 (from the past 48 hour(s))  CBC with Differential     Status: Abnormal   Collection Time: 04/16/23  5:46 PM  Result Value Ref Range   WBC 8.6 4.0 - 10.5 K/uL   RBC 4.34 4.22 - 5.81 MIL/uL   Hemoglobin 12.5 (L) 13.0 - 17.0 g/dL   HCT 29.5 (L) 62.1 - 30.8 %   MCV 88.0 80.0 - 100.0 fL   MCH 28.8 26.0 - 34.0 pg   MCHC 32.7 30.0 - 36.0 g/dL   RDW 65.7 84.6 - 96.2 %   Platelets 314 150 - 400 K/uL   nRBC 0.0 0.0 - 0.2 %   Neutrophils Relative % 68 %   Neutro Abs 5.9 1.7 - 7.7 K/uL   Lymphocytes Relative 14 %   Lymphs Abs 1.2 0.7 - 4.0 K/uL   Monocytes Relative 11 %   Monocytes Absolute 0.9 0.1 - 1.0 K/uL   Eosinophils Relative 6 %   Eosinophils Absolute 0.5 0.0 - 0.5 K/uL   Basophils Relative 1 %   Basophils Absolute 0.0 0.0 - 0.1 K/uL   Immature Granulocytes 0 %   Abs Immature Granulocytes 0.02 0.00 - 0.07 K/uL    Comment: Performed at [facility], [address] [place], [place] [address]  Comprehensive metabolic panel     Status: Abnormal   Collection Time: 04/16/23  5:46 PM  Result Value Ref Range   Sodium 132 (L) 135 - 145 mmol/L   Potassium 3.3 (L) 3.5 - 5.1 mmol/L   Chloride 93 (L) 98 - 111 mmol/L   CO2 29 22 - 32 mmol/L   Glucose, Bld 122 (H) 70 - 99 mg/dL    Comment: Glucose reference range applies only to  samples taken after fasting for at least 8 hours.   BUN 10 6 - 20 mg/dL   Creatinine, Ser 1.32 0.61 - 1.24 mg/dL   Calcium 8.8 (L) 8.9 - 10.3 mg/dL   Total Protein 6.8 6.5 - 8.1 g/dL   Albumin 3.7 3.5 - 5.0 g/dL   AST 24 15 - 41 U/L   ALT 20 0 - 44 U/L   Alkaline Phosphatase 107 38 - 126 U/L   Total Bilirubin 1.2 0.3 - 1.2 mg/dL   GFR, Estimated >44 >01 mL/min  Comment: (NOTE) Calculated using the CKD-EPI Creatinine Equation (2021)    Anion gap 10 5 - 15    Comment: Performed at [facility], [address] [place], [place] [address]  Lipase, blood     Status: None   Collection Time: 04/16/23  5:46 PM  Result Value Ref Range   Lipase 26 11 - 51 U/L    Comment: Performed at [facility], [address], [place], [place] [address]  I-Stat CG4 Lactic Acid     Status: None   Collection Time: 04/16/23  5:57 PM  Result Value Ref Range   Lactic Acid, Venous 1.3 0.5 - 1.9 mmol/L  Protime-INR     Status: None   Collection Time: 04/16/23  6:06 PM  Result Value Ref Range   Prothrombin Time 15.1 11.4 - 15.2 seconds   INR 1.2 0.8 - 1.2    Comment: (NOTE) INR goal varies based on device and disease states. Performed at [facility], [address], [place], [place] [address]   Ammonia     Status: None   Collection Time: 04/16/23  6:06 PM  Result Value Ref Range   Ammonia 33 9 - 35 umol/L    Comment: Performed at [facility], [address] [place], [place] [address]   No results found.  Pending Labs Unresulted Labs (From admission, onward)     Start     Ordered   04/17/23 0500  Vitamin B12  (Anemia Panel (PNL))  Tomorrow morning,   R        04/16/23 1956   04/17/23 0500  Folate  (Anemia Panel (PNL))  Tomorrow morning,   R        04/16/23 1956   04/16/23 1957  Iron and TIBC  (Anemia Panel (PNL))  Add-on,   AD        04/16/23 1956   04/16/23 1957  Ferritin  (Anemia Panel (PNL))  Add-on,   AD         04/16/23 1956   04/16/23 1957  Reticulocytes  (Anemia Panel (PNL))  Add-on,   AD        04/16/23 1956   04/16/23 1957  TSH  Add-on,   AD        04/16/23 1956   04/16/23 1957  CK  Add-on,   AD        04/16/23 1956   04/16/23 1957  Magnesium  Add-on,   AD        04/16/23 1956   04/16/23 1957  Phosphorus  Add-on,   AD        04/16/23 1956   04/16/23 1957  Osmolality, urine  Once,   R        04/16/23 1956   04/16/23 1957  Osmolality  Add-on,   AD        04/16/23 1956   04/16/23 1957  Creatinine, urine, random  Once,   R        04/16/23 1956   04/16/23 1957  Sodium, urine, random  Once,   R        04/16/23 1956   04/16/23 1957  Urinalysis, Complete w Microscopic -Urine, Clean Catch  Once,   R       Question Answer Comment  Release to patient Immediate   Specimen Source Urine, Clean Catch      04/16/23 1956   04/16/23 1957  Rapid urine drug screen (hospital performed)  ONCE -  STAT,   STAT        04/16/23 1956   04/16/23 1714  Urinalysis, Routine w reflex microscopic -Urine, Clean Catch  (ED Abdominal Pain)  Once,   URGENT       Question:  Specimen Source  Answer:  Urine, Clean Catch   04/16/23 1713   Signed and Held  Magnesium  Tomorrow morning,   R        Signed and Held   Signed and Held  Phosphorus  Tomorrow morning,   R        Signed and Held   Signed and Held  Comprehensive metabolic panel  Tomorrow morning,   R       Question:  Release to patient  Answer:  Immediate   Signed and Held   Signed and Held  CBC  Tomorrow morning,   R       Question:  Release to patient  Answer:  Immediate   Signed and Held            Vitals/Pain Today's Vitals   04/16/23 1800 04/16/23 1830 04/16/23 1900 04/16/23 1930  BP: (!) 98/55 105/70 107/64 120/85  Pulse: 96 85 79 81  Resp: 19 14 12 15   Temp:      TempSrc:      SpO2: 91% 97% 96% 98%  Weight:      Height:      PainSc:        Isolation Precautions No active isolations  Medications Medications  ondansetron (ZOFRAN)  injection 4 mg (4 mg Intravenous Patient Refused/Not Given 04/16/23 1750)  morphine (PF) 4 MG/ML injection 4 mg (4 mg Intravenous Not Given 04/16/23 1737)  potassium chloride 10 mEq in 100 mL IVPB (has no administration in time range)  doxycycline (VIBRAMYCIN) 100 mg in sodium chloride 0.9 % 250 mL IVPB (has no administration in time range)  sodium chloride 0.9 % bolus 1,000 mL (0 mLs Intravenous Stopped 04/16/23 1931)    Mobility walks with device     Focused Assessments     R Recommendations: See Admitting Provider Note  Report given to:   Additional Notes:

## 2023-04-16 NOTE — Assessment & Plan Note (Addendum)
Replace check magnesium and phosphate levels repeat in a.m. - will replace and repeat in AM,  check magnesium and phosphate level and replace as needed   Lab Results  Component Value Date   K 3.3 (L) 04/16/2023    Lab Results  Component Value Date   MG 2.1 02/14/2023    Lab Results  Component Value Date   CREATININE 0.80 04/16/2023

## 2023-04-16 NOTE — H&P (Signed)
[name]:[id number] DOB: Jul 18, 1966 DOA: 04/16/2023     PCP: Peter Livings, MD   Outpatient Specialists:   CARDS:  Dr. Gypsy Balsam, MD  Orthopedics dr. Lyla Thompson Pulmonary   Dr.Chandra in [place]    Patient arrived to ER on 04/16/23 at 1644 Referred by Attending Peter Lefevre, MD   Patient coming from:    home Lives alone,     Chief Complaint:   Chief Complaint  Patient presents with   Abdominal Pain    HPI: Peter Thompson is a 57 y.o. male with medical history significant of  HTN, OSA, obesity, A.fib on eliquis, PAD, infected prosthetic knee with GN, chronic back pain    Presented with  abd pain Patient multiple medical problems including A-fib on Eliquis history of septic arthritis of the knee given suspected prosthetic joint comes in with abdominal pain Initially was evaluated at [facility] ER yesterday with abdominal pain nausea and vomiting he had imaging done that showed small bowel obstruction secondary to umbilical hernia and that he needs surgery his left AMA and now presented to [facility] CT from [facility] showing loop of distal small bowel within the moderate-sized umbilical hernia with associated small bowel obstruction General surgery was consulted and requested medical admission they will see patient     Recent admit for septic arthritis of the knee requiring PICC placement and IV antibiotics   ID was found to have gram-negative bacteremia Blood cultures with pasteurella multocida in 1/2 sets, BCID negative  Completed 6 weeks of IV ceftriaxone now should be on 3 months of cefadroxil 1 g twice daily Now on Doxycyline Reports he has not been drinking   Denies significant ETOH intake   Does not smoke     Regarding pertinent Chronic problems:     Hyperlipidemia -  not on statins  Lipid Panel     Component Value Date/Time   CHOL 130 01/29/2021 1159   TRIG 120 01/29/2021 1159   HDL 36 (L) 01/29/2021 1159   CHOLHDL 3.6 01/29/2021  1159   LDLCALC 72 01/29/2021 1159   LABVLDL 22 01/29/2021 1159     HTN on amlodipine, clonidine, labetalol, lasix has been off his meds bc BP been running low   chronic CHF diastolic  last echo  Recent Results (from the past 81191 hour(s))  ECHOCARDIOGRAM COMPLETE   Collection Time: 10/26/21  5:05 PM  Result Value   S' Lateral 3.50   Narrative      ECHOCARDIOGRAM REPORT      IMPRESSIONS    1. Left ventricular ejection fraction, by estimation, is 60 to 65%. The left ventricle has normal function. The left ventricle has no regional wall motion abnormalities. Left ventricular diastolic parameters are indeterminate.  2. Right ventricular systolic function is normal. The right ventricular size is normal. There is mildly elevated pulmonary artery systolic pressure.  3. Left atrial size was moderately dilated.  4. The mitral valve is normal in structure. No evidence of mitral valve regurgitation. No evidence of mitral stenosis.  5. The aortic valve is normal in structure. Aortic valve regurgitation is not visualized. No aortic stenosis is present.  6. The inferior vena cava is normal in size with greater than 50% respiratory variability, suggesting right atrial pressure of 3 mmHg.          obesity-   BMI Readings from Last 1 Encounters:  04/16/23 32.43 kg/m       COPD -  followed by pulmonology  OSA -on nocturnal   CPAP   Chronic intermittent hyponatremia  A. Fib -   CHA2DS2 vas score >3   current  on anticoagulation with  Eliquis,     -  Rate control:  Currently controlled with labetalol      PAD - -30-49% stenosis in right iliac segment, total occlusion in superficial femoral artery, diffuse atherosclerosis.  Diffuse atherosclerosis on L, no stenosis.  -Last seen by Dr. Bing Thompson 04/2022, at that time was on plavix but was later told to discontinue Plavix     CT worrisome for cirrhosis   MELD 3.0: 12 at 04/16/2023  6:06 PM    Hepatic Function Panel     Component Value  Date/Time   PROT 6.8 04/16/2023 1746   ALBUMIN 3.7 04/16/2023 1746   AST 24 04/16/2023 1746   ALT 20 04/16/2023 1746   ALKPHOS 107 04/16/2023 1746   BILITOT 1.2 04/16/2023 1746   INR 1.2   Chronic anemia - baseline hg Hemoglobin & Hematocrit  Recent Labs    02/13/23 0515 02/14/23 0922 04/16/23 1746  HGB 11.2* 11.0* 12.5*       While in ER:   General surgery has been consulted requested medicine to admit No need for   NG tube unless nauseous.  Hold Eliquis     Lab Orders         CBC with Differential         Comprehensive metabolic panel         Lipase, blood         Urinalysis, Routine w reflex microscopic -Urine, Clean Catch         Protime-INR         Ammonia        CTabd/pelvis - Loop of distal small bowel within a moderate-sized umbilical hernia with associated small bowel obstruction.   Changes of cirrhosis. Small amount of ascites.   Aortoiliac atherosclerosis.   Small pericardial effusion.   Following Medications were ordered in ER: Medications  ondansetron (ZOFRAN) injection 4 mg (4 mg Intravenous Patient Refused/Not Given 04/16/23 1750)  morphine (PF) 4 MG/ML injection 4 mg (4 mg Intravenous Not Given 04/16/23 1737)  sodium chloride 0.9 % bolus 1,000 mL (0 mLs Intravenous Stopped 04/16/23 1931)    _______________________________________________________ ER Provider Called:   General surgery    Dr.Cornett  They Recommend admit to medicine   Will see  in ER     ED Triage Vitals  Encounter Vitals Group     BP 04/16/23 1701 127/74     Systolic BP Percentile --      Diastolic BP Percentile --      Pulse Rate 04/16/23 1701 89     Resp 04/16/23 1701 16     Temp 04/16/23 1701 98.1 F (36.7 C)     Temp Source 04/16/23 1701 Oral     SpO2 04/16/23 1701 98 %     Weight 04/16/23 1707 226 lb (102.5 kg)     Height 04/16/23 1707 5\' 10"  (1.778 m)     Head Circumference --      Peak Flow --      Pain Score 04/16/23 1707 10     Pain Loc --      Pain  Education --      Exclude from Growth Chart --   UUVO(53)@     _________________________________________ Significant initial  Findings: Abnormal Labs Reviewed  CBC WITH DIFFERENTIAL/PLATELET - Abnormal; Notable for the following components:  Result Value   Hemoglobin 12.5 (*)    HCT 38.2 (*)    All other components within normal limits  COMPREHENSIVE METABOLIC PANEL - Abnormal; Notable for the following components:   Sodium 132 (*)    Potassium 3.3 (*)    Chloride 93 (*)    Glucose, Bld 122 (*)    Calcium 8.8 (*)    All other components within normal limits      ECG: Ordered Personally reviewed and interpreted by me showing: HR :   Rhythm:Atrial fibrillation Ventricular premature complex Low voltage, precordial leads Abnormal T, consider ischemia, lateral leads Prolonged QT interval  QTC 530  BNP (last 3 results) Recent Labs    02/12/23 0523  BNP 275.9*      The recent clinical data is shown below. Vitals:   04/16/23 1745 04/16/23 1800 04/16/23 1830 04/16/23 1900  BP: 96/66 (!) 98/55 105/70 107/64  Pulse: 90 96 85 79  Resp: 12 19 14 12   Temp:      TempSrc:      SpO2: 93% 91% 97% 96%  Weight:      Height:        WBC     Component Value Date/Time   WBC 8.6 04/16/2023 1746   LYMPHSABS 1.2 04/16/2023 1746   MONOABS 0.9 04/16/2023 1746   EOSABS 0.5 04/16/2023 1746   BASOSABS 0.0 04/16/2023 1746    Lactic Acid, Venous    Component Value Date/Time   LATICACIDVEN 1.3 04/16/2023 1757         UA  ordered     Results for orders placed or performed during the hospital encounter of 02/10/23  Blood culture (routine x 2)     Status: None   Collection Time: 02/10/23  1:25 PM   Specimen: BLOOD LEFT FOREARM  Result Value Ref Range Status   Specimen Description   Final    BLOOD LEFT FOREARM Performed at [facility], [address] [place], [place] [address]    Special Requests   Final    BOTTLES DRAWN AEROBIC AND ANAEROBIC Blood Culture results may  not be optimal due to an excessive volume of blood received in culture bottles Performed at [facility], [address] [place], [place] [address]    Culture   Final    NO GROWTH 5 DAYS Performed at [facility], [address], [place], [place] [address]    Report Status 02/15/2023 FINAL  Final  Blood culture (routine x 2)     Status: Abnormal   Collection Time: 02/10/23  1:30 PM   Specimen: BLOOD RIGHT FOREARM  Result Value Ref Range Status   Specimen Description   Final    BLOOD RIGHT FOREARM Performed at [facility], [address], [place], [place] [address]    Special Requests   Final    BOTTLES DRAWN AEROBIC AND ANAEROBIC Blood Culture results may not be optimal due to an excessive volume of blood received in culture bottles Performed at [facility], [address], [place], [place] [address]    Culture  Setup Time   Final    GRAM NEGATIVE RODS ANAEROBIC BOTTLE ONLY CRITICAL RESULT CALLED TO, READ BACK BY AND VERIFIED WITH: PHARMD J. GADHIA 02/11/23 @ 0731 BY AB    Culture (A)  Final    PASTEURELLA MULTOCIDA Usually susceptible to penicillin and other beta lactam agents,quinolones,macrolides and tetracyclines. Performed at [facility], [address] [place], [place] [address]    Report Status 02/13/2023 FINAL  Final  Blood Culture ID Panel (Reflexed)     Status: None   Collection Time: 02/10/23  1:30 PM  Result Value Ref Range Status   Enterococcus faecalis NOT DETECTED NOT DETECTED Final   Enterococcus Faecium NOT DETECTED NOT DETECTED Final   Listeria monocytogenes NOT DETECTED NOT DETECTED Final   Staphylococcus species NOT DETECTED NOT DETECTED Final   Staphylococcus aureus (BCID) NOT DETECTED NOT DETECTED Final   Staphylococcus epidermidis NOT DETECTED NOT DETECTED Final   Staphylococcus lugdunensis NOT DETECTED NOT DETECTED Final   Streptococcus species NOT DETECTED NOT DETECTED Final   Streptococcus  agalactiae NOT DETECTED NOT DETECTED Final   Streptococcus pneumoniae NOT DETECTED NOT DETECTED Final   Streptococcus pyogenes NOT DETECTED NOT DETECTED Final   A.calcoaceticus-baumannii NOT DETECTED NOT DETECTED Final   Bacteroides fragilis NOT DETECTED NOT DETECTED Final   Enterobacterales NOT DETECTED NOT DETECTED Final   Enterobacter cloacae complex NOT DETECTED NOT DETECTED Final   Escherichia coli NOT DETECTED NOT DETECTED Final   Klebsiella aerogenes NOT DETECTED NOT DETECTED Final   Klebsiella oxytoca NOT DETECTED NOT DETECTED Final   Klebsiella pneumoniae NOT DETECTED NOT DETECTED Final   Proteus species NOT DETECTED NOT DETECTED Final   Salmonella species NOT DETECTED NOT DETECTED Final   Serratia marcescens NOT DETECTED NOT DETECTED Final   Haemophilus influenzae NOT DETECTED NOT DETECTED Final   Neisseria meningitidis NOT DETECTED NOT DETECTED Final   Pseudomonas aeruginosa NOT DETECTED NOT DETECTED Final   Stenotrophomonas maltophilia NOT DETECTED NOT DETECTED Final   Candida albicans NOT DETECTED NOT DETECTED Final   Candida auris NOT DETECTED NOT DETECTED Final   Candida glabrata NOT DETECTED NOT DETECTED Final   Candida krusei NOT DETECTED NOT DETECTED Final   Candida parapsilosis NOT DETECTED NOT DETECTED Final   Candida tropicalis NOT DETECTED NOT DETECTED Final   Cryptococcus neoformans/gattii NOT DETECTED NOT DETECTED Final    Comment: Performed at [facility], [address] [place], [place] [address]  Surgical pcr screen     Status: None   Collection Time: 02/13/23 10:36 PM   Specimen: Nasal Mucosa; Nasal Swab  Result Value Ref Range Status   MRSA, PCR NEGATIVE NEGATIVE Final   Staphylococcus aureus NEGATIVE NEGATIVE Final    Comment: (NOTE) The Xpert SA Assay (FDA approved for NASAL specimens in patients 72 years of age and older), is one component of a comprehensive surveillance program. It is not intended to diagnose infection nor to guide or  monitor treatment. Performed at [facility], [address], [place], [place] [address]   Aerobic/Anaerobic Culture w Gram Stain (surgical/deep wound)     Status: None   Collection Time: 02/14/23  2:31 PM   Specimen: Path fluid; Body Fluid  Result Value Ref Range Status   Specimen Description   Final    SYNOVIAL RIGHT KNEE Performed at [facility], [address], [place], [place] [address]    Special Requests UNISYN  Final   Gram Stain   Final    ABUNDANT WBC PRESENT, PREDOMINANTLY PMN NO ORGANISMS SEEN    Culture   Final    No growth aerobically or anaerobically. Performed at [facility], [address], [place], [place] [address]    Report Status 02/19/2023 FINAL  Final     __________________________________________________________ Recent Labs  Lab 04/16/23 1746  NA 132*  K 3.3*  CO2 29  GLUCOSE 122*  BUN 10  CREATININE 0.80  CALCIUM 8.8*    Cr   stable,    Lab Results  Component Value Date   CREATININE 0.80 04/16/2023   CREATININE 0.66 02/14/2023   CREATININE 0.91 02/13/2023    Recent Labs  Lab 04/16/23 1746  AST 24  ALT 20  ALKPHOS 107  BILITOT 1.2  PROT 6.8  ALBUMIN 3.7   Lab Results  Component Value Date   CALCIUM 8.8 (L) 04/16/2023   PHOS 3.7 02/14/2023       Plt: Lab Results  Component Value Date   PLT 314 04/16/2023       Recent Labs  Lab 04/16/23 1746  WBC 8.6  NEUTROABS 5.9  HGB 12.5*  HCT 38.2*  MCV 88.0  PLT 314    HG/HCT  stable,     Component Value Date/Time   HGB 12.5 (L) 04/16/2023 1746   HGB 11.0 (L) 06/04/2021 1502   HCT 38.2 (L) 04/16/2023 1746   HCT 32.2 (L) 06/04/2021 1502   MCV 88.0 04/16/2023 1746   MCV 89 06/04/2021 1502    Recent Labs  Lab 04/16/23 1746  LIPASE 26   Recent Labs  Lab 04/16/23 1806  AMMONIA 33      _______________________________________________ Hospitalist was called for admission for   Small bowel obstruction   Umbilical hernia  with obstruction  Atrial fibrillation, permanent (HCC)     The following Work up has been ordered so far:  Orders Placed This Encounter  Procedures   CBC with Differential   Comprehensive metabolic panel   Lipase, blood   Urinalysis, Routine w reflex microscopic -Urine, Clean Catch   Protime-INR   Ammonia   ED Cardiac monitoring   Consult to general surgery   Consult to hospitalist   ED EKG   EKG 12-Lead   Insert peripheral IV     OTHER Significant initial  Findings:  labs showing:     DM  labs:  HbA1C: No results for input(s): "HGBA1C" in the last 8760 hours.     CBG (last 3)  No results for input(s): "GLUCAP" in the last 72 hours.        Cultures:    Component Value Date/Time   SDES  02/14/2023 1431    SYNOVIAL RIGHT KNEE Performed at [facility], [address], [place], [place] [address]    SPECREQUEST UNISYN 02/14/2023 1431   CULT  02/14/2023 1431    No growth aerobically or anaerobically. Performed at [facility], [address], [place], [place] [address]    REPTSTATUS 02/19/2023 FINAL 02/14/2023 1431     Radiological Exams on Admission: No results found. _______________________________________________________________________________________________________ Latest  Blood pressure 107/64, pulse 79, temperature 98.1 F (36.7 C), temperature source Oral, resp. rate 12, height 5\' 10"  (1.778 m), weight 102.5 kg, SpO2 96%.   Vitals  labs and radiology finding personally reviewed  Review of Systems:    Pertinent positives include:  chills, fatigue, abdominal pain, nausea,  Constitutional:  No weight loss, night sweats, Fevers,  weight loss  HEENT:  No headaches, Difficulty swallowing,Tooth/dental problems,Sore throat,  No sneezing, itching, ear ache, nasal congestion, post nasal drip,  Cardio-vascular:  No chest pain, Orthopnea, PND, anasarca, dizziness, palpitations.no Bilateral lower extremity swelling  GI:  No  heartburn, indigestion, vomiting, diarrhea, change in bowel habits, loss of appetite, melena, blood in stool, hematemesis Resp:  no shortness of breath at rest. No dyspnea on exertion, No excess mucus, no  productive cough, No non-productive cough, No coughing up of blood.No change in color of mucus.No wheezing. Skin:  no rash or lesions. No jaundice GU:  no dysuria, change in color of urine, no urgency or frequency. No straining to urinate.  No flank pain.  Musculoskeletal:  No joint pain or no joint swelling. No decreased range of motion. No back pain.  Psych:  No change in mood or affect. No depression or anxiety. No memory loss.  Neuro: no localizing neurological complaints, no tingling, no weakness, no double vision, no gait abnormality, no slurred speech, no confusion  All systems reviewed and apart from HOPI all are negative _______________________________________________________________________________________________ Past Medical History:   Past Medical History:  Diagnosis Date   Abnormal results of liver function studies 01/21/2021   Anemia 10/17/2018   Anxiety    Arthritis    Bursitis    Cervicogenic headache 10/06/2020   Complication of anesthesia    "woke up in OR- IV came out."   Concussion    several- head injury- was in milatary   DJD (degenerative joint disease)    Essential hypertension 10/17/2018   History of arthroscopic procedure on shoulder 01/21/2021   Hypertension    Insomnia    Left-sided epistaxis 01/30/2019   Low back pain 01/21/2021   Neck fracture (HCC)    Obesity 01/21/2021   Obstructive sleep apnea 09/04/2019   Osteoarthrosis, unspecified whether generalized or localized, other specified sites    Other and unspecified hyperlipidemia 01/21/2021   Other specified counseling 01/21/2021   Other, mixed, or unspecified nondependent drug abuse, unspecified 01/21/2021   Paroxysmal atrial fibrillation (HCC) 12/26/2018   Persistent atrial fibrillation (HCC)     Pre-op evaluation 12/05/2016   Primary localized osteoarthritis of left hip 05/10/2016   Primary osteoarthritis of left hip 05/10/2016   Primary osteoarthritis of right knee 10/02/2018   Psychosocial circumstance 01/21/2021   Smoking 12/05/2016   Tinnitus, bilateral     Past Surgical History:  Procedure Laterality Date   ANKLE SURGERY Left    Arm surgery Right    fracture repair   CERVICAL SPINE SURGERY     bone graft from left hip   HAND SURGERY Left    drains for Infection   HIP SURGERY Left    "scrapped" x2   KNEE ARTHROSCOPY Right    KNEE ARTHROSCOPY Right 02/14/2023   Procedure: RIGHT KNEE ARTHROSCOPY WITH IRRIGATION AND DEBRIDEMENT;  Surgeon: Marcene Corning, MD;  Location: [facility];  Service: Orthopedics;  Laterality: Right;   LEG SURGERY Left    "1 inch took out"   SHOULDER ARTHROSCOPY Left 12/27/2016   Procedure: ARTHROSCOPY SHOULDER;  Surgeon: Marcene Corning, MD;  Location: [facility] OR;  Service: Orthopedics;  Laterality: Left;  Debridement, AC Decompression, Acromioplasty    TOTAL HIP ARTHROPLASTY Left 05/10/2016   Procedure: TOTAL HIP ARTHROPLASTY ANTERIOR APPROACH;  Surgeon: Marcene Corning, MD;  Location: [facility] OR;  Service: Orthopedics;  Laterality: Left;   TOTAL KNEE ARTHROPLASTY Right 10/02/2018   Procedure: TOTAL KNEE ARTHROPLASTY;  Surgeon: Marcene Corning, MD;  Location: [facility] OR;  Service: Orthopedics;  Laterality: Right;   WRIST SURGERY Right    2 Pinns    Social History:  Ambulatory   independently       reports that he quit smoking about 2 years ago. His smoking use included cigarettes. He has quit using smokeless tobacco.  His smokeless tobacco use included chew. He reports current alcohol use of about 12.0 standard drinks of alcohol per week. He reports  that he does not use drugs.     Family History:   Family History  Problem Relation Age of Onset   Cancer Other    Heart disease Neg Hx    Headache Neg Hx    Migraines Neg Hx     ______________________________________________________________________________________________ Allergies: No Known Allergies   Prior to Admission medications   Medication Sig Start Date End Date Taking? Authorizing Provider  ALPRAZolam Prudy Feeler) 0.5 MG tablet Take 1 mg by mouth at bedtime.   Yes [provider]  amLODipine (NORVASC) 10 MG tablet Take 10 mg by mouth daily.   Yes [provider]  budesonide-formoterol (SYMBICORT) 160-4.5 MCG/ACT inhaler Inhale 2 puffs into the lungs 2 (two) times daily as needed (for flares).   Yes [provider]  cloNIDine (CATAPRES) 0.2 MG tablet Take 0.2 mg by mouth 3 (three) times daily.   Yes [provider]  doxycycline (ADOXA) 100 MG tablet Take 100 mg by mouth 2 (two) times daily. 04/03/23 04/18/23 Yes [provider]  doxycycline (VIBRA-TABS) 100 MG tablet Take 1 tablet (100 mg total) by mouth 2 (two) times daily. 04/03/23  Yes Odette Fraction, MD  ELIQUIS 5 MG TABS tablet TAKE ONE TABLET BY MOUTH TWICE DAILY 04/03/23  Yes Georgeanna Lea, MD  furosemide (LASIX) 80 MG tablet Take 80 mg by mouth in the morning.   Yes [provider]  labetalol (NORMODYNE) 300 MG tablet Take 1 tablet (300 mg total) by mouth 2 (two) times daily. 10/04/18  Yes Elodia Florence, PA-C  Multiple Vitamins-Minerals (MULTIVITAMIN WITH MINERALS) tablet Take 1 tablet by mouth daily.   Yes [provider]  oxyCODONE 20 MG TABS Take 1 tablet (20 mg total) by mouth every 4 (four) hours as needed (severe pain). Patient taking differently: Take 20 mg by mouth every 4 (four) hours. 10/04/18  Yes Elodia Florence, PA-C  potassium chloride (KLOR-CON) 10 MEQ tablet Take 10 mEq by mouth daily. 04/25/19  Yes [provider]  triamcinolone ointment (KENALOG) 0.5 % Apply 1 Application topically 2 (two) times daily. 03/31/23  Yes Vu, Tonita Phoenix, MD  pantoprazole (PROTONIX) 40 MG tablet Take 1 tablet (40 mg total) by mouth daily. Patient  not taking: Reported on 03/23/2023 02/16/23   Jonah Blue, MD  Ms [facility] HFA 108 [phone] Base) MCG/ACT inhaler Inhale 2 puffs into the lungs 4 (four) times daily as needed for wheezing or shortness of breath. 12/29/20   [provider]    ___________________________________________________________________________________________________ Physical Exam:    04/16/2023    7:00 PM 04/16/2023    6:30 PM 04/16/2023    6:00 PM  Vitals with BMI  Systolic 107 105 98  Diastolic 64 70 55  Pulse 79 85 96     1. General:  in No  Acute distress   Chronically ill   -appearing 2. Psychological: lethargic and   Oriented 3. Head/ENT:   Dry Mucous Membranes                          Head Non traumatic, neck supple                            Poor Dentition 4. SKIN: decreased Skin turgor,  Skin clean Dry  bilateral leg discoloration Redness around umbilicus    5. Heart: Regular rate and rhythm no  Murmur, no Rub or gallop 6. Lungs:  , no wheezes or crackles  7. Abdomen: Soft, -tender,  distended bowel sounds decreased/ absent 8. Lower extremities: no clubbing, cyanosis, 1+ edema 9. Neurologically Grossly intact, moving all 4 extremities equally  10. MSK: Normal range of motion    Chart has been reviewed  ______________________________________________________________________________________________  Assessment/Plan 57 y.o. male with medical history significant of  HTN, OSA, obesity, A.fib on eliquis, PAD, infected prosthetic knee with GN, chronic back pain    Admitted for   Small bowel obstruction (HCC)    Umbilical hernia with obstruction  Atrial fibrillation, permanent (HCC)     Present on Admission:  SBO (small bowel obstruction) (HCC)  Persistent atrial fibrillation (HCC)  Essential (primary) hypertension  Obesity  Hypokalemia  Peripheral vascular disease (HCC)  Obstructive sleep apnea (adult) (pediatric)  Cirrhosis (HCC)  Anemia  Prolonged QT interval  Guarded prognosis given  severity of acute illness and multiple medical problems and need for emergent surgical procedure  SBO (small bowel obstruction) (HCC)  Likely cause umbilical hernia   - NPO  - appreciate General surgery consult. Hold Eliquis in anticipation for surgical intervention 8:34 PM Was seen by General surgery in ER and plan was to take to OR emergently May need to adjust level of care pending status post op  Persistent atrial fibrillation (HCC) Hold Eliquis as patient may need surgical intervention Hold labetalol for now given hypotension  Essential (primary) hypertension Allow permissive hypertension  Obesity Follow-up as an outpatient  Hypokalemia Replace check magnesium and phosphate levels repeat in a.m. - will replace and repeat in AM,  check magnesium and phosphate level and replace as needed   Lab Results  Component Value Date   K 3.3 (L) 04/16/2023    Lab Results  Component Value Date   MG 2.1 02/14/2023    Lab Results  Component Value Date   CREATININE 0.80 04/16/2023     Peripheral vascular disease (HCC) Eliquis on hold patient has been off Plavix  Obstructive sleep apnea (adult) (pediatric) Hold off of CPAP for now given SBO  Cirrhosis (HCC) CT scan worrisome for cirrhosis with minimal ascites.    Continue to follow labs Pt states he stopped drinking  Infected prosthetic knee joint (HCC) Patient was supposed to finish antibiotics as an outpatient now on doxycycline 100 mg po BID will change to IV  Anemia Obtain anemia panel  Transfuse for H <7 or if symptomatic   Other plan as per orders.  DVT prophylaxis:  SCD        Code Status:    Code Status: Prior FULL CODE  as per patient   I had personally discussed CODE STATUS with patient  ACP   none     Family Communication:   Family   at  Bedside  plan of care was discussed  with   Wife,   Diet  npo   Disposition Plan:     likely will need placement for rehabilitation                            Following barriers for discharge:                           SBO adressed                           Will need consultants to evaluate patient prior to discharge       Consult  Orders  (From admission, onward)           Start     Ordered   04/16/23 1858  Consult to hospitalist  Once       Provider:  (Not yet assigned)  Question Answer Comment  Place call to: [facility]   Reason for Consult Admit      04/16/23 1857                                Consults called: general surgery   Admission status:  ED Disposition     ED Disposition  Admit   Condition  --   Comment  The patient appears reasonably stabilized for admission considering the current resources, flow, and capabilities available in the ED at this time, and I doubt any other [facility] requiring further screening and/or treatment in the ED prior to admission is  present.            inpatient     I Expect 2 midnight stay secondary to severity of patient's current illness need for inpatient interventions justified by the following:  hemodynamic instability despite optimal treatment (tachycardia hypotension    Severe lab/radiological/exam abnormalities including:    Incarcerated hernia and extensive comorbidities including:  substance abuse   Chronic pain    COPD/asthma   Obesity   Chronic anticoagulation  That are currently affecting medical management.   I expect  patient to be hospitalized for 2 midnights requiring inpatient medical care.  Patient is at high risk for adverse outcome (such as loss of life or disability) if not treated.  Indication for inpatient stay as follows:   Hemodynamic instability despite maximal medical therapy,   ,  severe pain requiring acute inpatient management,    Need for operative/procedural  intervention     Need for IV antibiotics, IV fluids,      Level of care      progressive     stepdown   tele indefinitely please discontinue once patient no longer  qualifies COVID-19 Labs      04/16/2023, 8:50 PM    [facility]     after 2 AM please page floor coverage PA If 7AM-7PM, please contact the day team taking care of the patient using [email or web address]

## 2023-04-16 NOTE — ED Notes (Signed)
Pt notified about need for UA sample.

## 2023-04-16 NOTE — Transfer of Care (Signed)
Immediate Anesthesia Transfer of Care Note  Patient: Peter Thompson  Procedure(s) Performed: HERNIA REPAIR UMBILICAL ADULT  Patient Location: PACU  Anesthesia Type:General  Level of Consciousness: awake and drowsy  Airway & Oxygen Therapy: Patient Spontanous Breathing and Patient connected to face mask oxygen  Post-op Assessment: Report given to RN and Post -op Vital signs reviewed and stable  Post vital signs: Reviewed and stable  Last Vitals:  Vitals Value Taken Time  BP 140/114 04/16/23 2215  Temp 36.3 C 04/16/23 2210  Pulse 26 04/16/23 2215  Resp 20 04/16/23 2215  SpO2 97 % 04/16/23 2215  Vitals shown include unfiled device data.  Last Pain:  Vitals:   04/16/23 1707  TempSrc:   PainSc: 10-Worst pain ever         Complications: No notable events documented.

## 2023-04-17 ENCOUNTER — Encounter (HOSPITAL_COMMUNITY): Payer: Self-pay | Admitting: Surgery

## 2023-04-17 ENCOUNTER — Inpatient Hospital Stay (HOSPITAL_COMMUNITY): Payer: Medicaid Other

## 2023-04-17 DIAGNOSIS — Z6835 Body mass index (BMI) 35.0-35.9, adult: Secondary | ICD-10-CM

## 2023-04-17 DIAGNOSIS — I4821 Permanent atrial fibrillation: Secondary | ICD-10-CM | POA: Diagnosis not present

## 2023-04-17 DIAGNOSIS — K420 Umbilical hernia with obstruction, without gangrene: Secondary | ICD-10-CM | POA: Diagnosis not present

## 2023-04-17 DIAGNOSIS — K7460 Unspecified cirrhosis of liver: Secondary | ICD-10-CM | POA: Diagnosis not present

## 2023-04-17 DIAGNOSIS — K56609 Unspecified intestinal obstruction, unspecified as to partial versus complete obstruction: Secondary | ICD-10-CM | POA: Diagnosis not present

## 2023-04-17 MED ORDER — ADULT MULTIVITAMIN W/MINERALS CH
1.0000 | ORAL_TABLET | Freq: Every day | ORAL | Status: DC
  Administered 2023-04-17: 1 via ORAL
  Filled 2023-04-17: qty 1

## 2023-04-17 MED ORDER — APIXABAN 5 MG PO TABS: 5.0000 mg | ORAL_TABLET | Freq: Two times a day (BID) | ORAL | Status: DC

## 2023-04-17 MED ORDER — ALPRAZOLAM 0.5 MG PO TABS: 1.0000 mg | ORAL_TABLET | Freq: Every day | ORAL | Status: DC

## 2023-04-17 MED ORDER — CLONIDINE HCL 0.2 MG PO TABS
0.2000 mg | ORAL_TABLET | Freq: Three times a day (TID) | ORAL | Status: DC
  Administered 2023-04-17: 0.2 mg via ORAL
  Filled 2023-04-17: qty 1

## 2023-04-17 NOTE — Discharge Instructions (Signed)
[facility] _______[facility]  UMBILICAL OR INGUINAL HERNIA REPAIR: POST OP INSTRUCTIONS  Always review your discharge instruction sheet given to you by the facility where your surgery was performed. IF YOU HAVE DISABILITY OR FAMILY LEAVE FORMS, YOU MUST BRING THEM TO THE OFFICE FOR PROCESSING.   DO NOT GIVE THEM TO YOUR DOCTOR.  1. A  prescription for pain medication may be given to you upon discharge.  Take your pain medication as prescribed, if needed.  If narcotic pain medicine is not needed, then you may take acetaminophen (Tylenol) or ibuprofen (Advil) as needed. 2. Take your usually prescribed medications unless otherwise directed. If you need a refill on your pain medication, please contact your pharmacy.  They will contact our office to request authorization. Prescriptions will not be filled after 5 pm or on week-ends. 3. You should follow a light diet the first 24 hours after arrival home, such as soup and crackers, etc.  Be sure to include lots of fluids daily.  Resume your normal diet the day after surgery. 4.Most patients will experience some swelling and bruising around the umbilicus or in the groin and scrotum.  Ice packs and reclining will help.  Swelling and bruising can take several days to resolve.  6. It is common to experience some constipation if taking pain medication after surgery.  Increasing fluid intake and taking a stool softener (such as Colace) will usually help or prevent this problem from occurring.  A mild laxative (Milk of Magnesia or Miralax) should be taken according to package directions if there are no bowel movements after 48 hours. 7. Unless discharge instructions indicate otherwise, you may remove your bandages 24-48 hours after surgery, and you may shower at that time.  You may have steri-strips (small skin tapes) in place directly over the incision.  These strips should be left on the skin for 7-10 days.  If your surgeon used skin glue on the  incision, you may shower in 24 hours.  The glue will flake off over the next 2-3 weeks.  Any sutures or staples will be removed at the office during your follow-up visit. 8. ACTIVITIES:  You may resume regular (light) daily activities beginning the next day--such as daily self-care, walking, climbing stairs--gradually increasing activities as tolerated.  You may have sexual intercourse when it is comfortable.  Refrain from any heavy lifting or straining until approved by your doctor.  a.You may drive when you are no longer taking prescription pain medication, you can comfortably wear a seatbelt, and you can safely maneuver your car and apply brakes. b.RETURN TO WORK:   _____________________________________________  9.You should see your doctor in the office for a follow-up appointment approximately 2-3 weeks after your surgery.  Make sure that you call for this appointment within a day or two after you arrive home to insure a convenient appointment time. 10.OTHER INSTRUCTIONS: _OK TO SHOWER NO LIFTING MORE THAN 15 POUNDS FOR 4 TO 6 WEEKS ICE PACK, TYLENOL, IBUPROFEN, AND BINDER ALSO FOR PAIN________________________    _____________________________________  WHEN TO CALL YOUR DOCTOR: Fever over 101.0 Inability to urinate Nausea and/or vomiting Extreme swelling or bruising Continued bleeding from incision. Increased pain, redness, or drainage from the incision  The clinic staff is available to answer your questions during regular business hours.  Please don't hesitate to call and ask to speak to one of the nurses for clinical concerns.  If you have a medical emergency, go to the nearest emergency room or call 911.  A surgeon from [facility] is always on call at the hospital   [address], [address], [place], [place]  [address] ?  [address] [address], [place], [place]   [address] [phone] ? [phone] ? FAX [phone] Web site: [email or web address]

## 2023-04-17 NOTE — Plan of Care (Signed)

## 2023-04-17 NOTE — Progress Notes (Signed)
1 Day Post-Op   Subjective/Chief Complaint: Comfortable this morning Passing flatus Ambulating Denies nausea hungry   Objective: Vital signs in last 24 hours: Temp:  [97.3 F (36.3 C)-98.1 F (36.7 C)] 97.7 F (36.5 C) (08/05 0825) Pulse Rate:  [45-108] 92 (08/05 0825) Resp:  [12-23] 16 (08/05 0825) BP: (96-150)/(55-104) 119/96 (08/05 0825) SpO2:  [85 %-99 %] 97 % (08/05 0825) Weight:  [102.5 kg-112.8 kg] 112.8 kg (08/04 2259) Last BM Date : 04/14/23  Intake/Output from previous day: 08/04 0701 - 08/05 0700 In: 2950 [I.V.:1600; IV Piggyback:1350] Out: 650 [Blood:50] Intake/Output this shift: No intake/output data recorded.  Exam: Awake and alert Comfortable Abdomin soft, binder in place  Lab Results:  Recent Labs    04/16/23 1746 04/17/23 0438  WBC 8.6 4.6  HGB 12.5* 12.6*  HCT 38.2* 39.2  PLT 314 264   BMET Recent Labs    04/16/23 1746 04/17/23 0438  NA 132* 132*  K 3.3* 4.0  CL 93* 95*  CO2 29 27  GLUCOSE 122* 174*  BUN 10 10  CREATININE 0.80 0.81  CALCIUM 8.8* 9.1   PT/INR Recent Labs    04/16/23 1806  LABPROT 15.1  INR 1.2   ABG No results for input(s): "PHART", "HCO3" in the last 72 hours.  Invalid input(s): "PCO2", "PO2"  Studies/Results: No results found.  Anti-infectives: Anti-infectives (From admission, onward)    Start     Dose/Rate Route Frequency Ordered Stop   04/16/23 2359  doxycycline (VIBRAMYCIN) 100 mg in sodium chloride 0.9 % 250 mL IVPB        100 mg 125 mL/hr over 120 Minutes Intravenous Every 12 hours 04/16/23 2020     04/16/23 2100  ceFAZolin (ANCEF) IVPB 2g/100 mL premix        2 g 200 mL/hr over 30 Minutes Intravenous On call to O.R. 04/16/23 2043 04/16/23 2104   04/16/23 2044  ceFAZolin (ANCEF) 2-4 GM/100ML-% IVPB       Note to Pharmacy: Mortimer Fries A: cabinet override      04/16/23 2044 04/16/23 2121       Assessment/Plan: POD#1 s/p primary repair incarcerated umbilical hernia  Advance  diet Ambulate Ok for discharge from a general surgical standpoint when ok with medical service   Abigail Miyamoto MD 04/17/2023

## 2023-04-17 NOTE — Discharge Summary (Signed)
Physician Discharge Summary   Patient: Peter Thompson MRN: [id number] DOB: Aug 23, 1966  Admit date:     04/16/2023  Discharge date: 04/17/23  Discharge Physician: Marcelino Duster   PCP: Quitman Livings, MD   Recommendations at discharge:    PCP follow up in 1 week. Surgery clinic as scheduled.  Discharge Diagnoses: Principal Problem:   SBO (small bowel obstruction) (HCC) Active Problems:   Persistent atrial fibrillation (HCC)   Essential (primary) hypertension   Anemia   Obstructive sleep apnea (adult) (pediatric)   Obesity   Infected prosthetic knee joint (HCC)   Peripheral vascular disease (HCC)   Hypokalemia   Cirrhosis (HCC)   Prolonged QT interval  Resolved Problems:   * No resolved hospital problems. *  Hospital Course: ROLAN NOTEBOOM is a 57 y.o. male with medical history significant of  HTN, OSA, obesity, A.fib on eliquis, PAD, infected prosthetic knee with GN, chronic back pain presented with  abd pain. Patient multiple medical problems including A-fib on Eliquis history of septic arthritis of the knee given suspected prosthetic joint comes in with abdominal pain Initially was evaluated at [facility] ER yesterday with abdominal pain nausea and vomiting he had imaging done that showed small bowel obstruction secondary to umbilical hernia and that he needs surgery his left AMA and now presented to [facility] CT from [place] showing loop of distal small bowel within the moderate-sized umbilical hernia with associated small bowel obstruction General surgery was consulted and requested medical admission they will see patient.    Recent admit for septic arthritis of the knee requiring PICC placement and IV antibiotics   ID was found to have gram-negative bacteremia Blood cultures with pasteurella multocida in 1/2 sets, BCID negative  Completed 6 weeks of IV ceftriaxone now should be on 3 months of cefadroxil 1 g twice daily. Now on Doxycyline. Reports he has  not been drinking.  During hospital stay - Surgery team evaluated him, patient taken to OR for incarcerated umbilical hernia repair. Patient tolerated the procedure well, he is tolerating diet, passing gas. Ambulating in hallway. Surgery team cleared him for discharge. He wishes to go home. Advised to follow up with PCP, surgery clinic as scheduled. He understands and agrees with discharge plan.      Consultants: Surgery Procedures performed: incarcerated umbilical hernia repair  Disposition: Home Diet recommendation:  Discharge Diet Orders (From admission, onward)     Start     Ordered   04/17/23 0000  Diet - low sodium heart healthy        04/17/23 1235           Cardiac diet DISCHARGE MEDICATION: Allergies as of 04/17/2023   No Known Allergies      Medication List     TAKE these medications    ALPRAZolam 0.5 MG tablet Commonly known as: XANAX Take 1 mg by mouth at bedtime.   amLODipine 10 MG tablet Commonly known as: NORVASC Take 10 mg by mouth daily.   budesonide-formoterol 160-4.5 MCG/ACT inhaler Commonly known as: SYMBICORT Inhale 2 puffs into the lungs 2 (two) times daily as needed (for flares).   cloNIDine 0.2 MG tablet Commonly known as: CATAPRES Take 0.2 mg by mouth 3 (three) times daily.   doxycycline 100 MG tablet Commonly known as: VIBRA-TABS Take 1 tablet (100 mg total) by mouth 2 (two) times daily.   doxycycline 100 MG tablet Commonly known as: ADOXA Take 100 mg by mouth 2 (two) times daily.   Eliquis 5  MG Tabs tablet Generic drug: apixaban TAKE ONE TABLET BY MOUTH TWICE DAILY   furosemide 80 MG tablet Commonly known as: LASIX Take 80 mg by mouth in the morning.   labetalol 300 MG tablet Commonly known as: NORMODYNE Take 1 tablet (300 mg total) by mouth 2 (two) times daily.   multivitamin with minerals tablet Take 1 tablet by mouth daily.   Oxycodone HCl 20 MG Tabs Take 1 tablet (20 mg total) by mouth every 4 (four) hours as  needed (severe pain). What changed: when to take this   pantoprazole 40 MG tablet Commonly known as: PROTONIX Take 1 tablet (40 mg total) by mouth daily.   potassium chloride 10 MEQ tablet Commonly known as: KLOR-CON Take 10 mEq by mouth daily.   ProAir HFA 108 (90 Base) MCG/ACT inhaler Generic drug: albuterol Inhale 2 puffs into the lungs 4 (four) times daily as needed for wheezing or shortness of breath.   triamcinolone ointment 0.5 % Commonly known as: KENALOG Apply 1 Application topically 2 (two) times daily.        Follow-up Information     Cornett, Maisie Fus, MD. Schedule an appointment as soon as possible for a visit in 4 week(s).   Specialty: General Surgery Contact information: [address] [place] [address] [place] [address] [phone]                Discharge Exam: Ceasar Mons Weights   04/16/23 1707 04/16/23 2259  Weight: 102.5 kg 112.8 kg   General -middle-aged Caucasian male, no apparent distress HEENT - PERRLA, EOMI, atraumatic head, non tender sinuses. Lung - Clear, rales, rhonchi, wheezes. Heart - S1, S2 heard, no murmurs, rubs, chronic pedal edema. Abdomen soft, nontender, surgical dressing intact. Neuro - Alert, awake and oriented x 3, non focal exam. Skin - Warm and dry.  Condition at discharge: stable  The results of significant diagnostics from this hospitalization (including imaging, microbiology, ancillary and laboratory) are listed below for reference.   Imaging Studies: No results found.  Microbiology: Results for orders placed or performed during the hospital encounter of 02/10/23  Blood culture (routine x 2)     Status: None   Collection Time: 02/10/23  1:25 PM   Specimen: BLOOD LEFT FOREARM  Result Value Ref Range Status   Specimen Description   Final    BLOOD LEFT FOREARM Performed at [facility], [address], [place], [place] [address]    Special Requests   Final    BOTTLES DRAWN AEROBIC AND ANAEROBIC Blood  Culture results may not be optimal due to an excessive volume of blood received in culture bottles Performed at [facility], [address] [place], [place] [address]    Culture   Final    NO GROWTH 5 DAYS Performed at [facility], [address], [place], [place] [address]    Report Status 02/15/2023 FINAL  Final  Blood culture (routine x 2)     Status: Abnormal   Collection Time: 02/10/23  1:30 PM   Specimen: BLOOD RIGHT FOREARM  Result Value Ref Range Status   Specimen Description   Final    BLOOD RIGHT FOREARM Performed at [facility], [address], [place], [place] [address]    Special Requests   Final    BOTTLES DRAWN AEROBIC AND ANAEROBIC Blood Culture results may not be optimal due to an excessive volume of blood received in culture bottles Performed at [facility], [address] [place], [place] [address]    Culture  Setup Time   Final    GRAM NEGATIVE RODS ANAEROBIC BOTTLE ONLY CRITICAL RESULT CALLED TO, READ BACK BY AND VERIFIED WITH: PHARMD J. GADHIA 02/11/23 @ 0731 BY AB    Culture (A)  Final    PASTEURELLA MULTOCIDA Usually susceptible to penicillin and other beta lactam agents,quinolones,macrolides and tetracyclines. Performed at [facility], [address], [place], [place] [address]    Report Status 02/13/2023 FINAL  Final  Blood Culture ID Panel (Reflexed)     Status: None   Collection Time: 02/10/23  1:30 PM  Result Value Ref Range Status   Enterococcus faecalis NOT DETECTED NOT DETECTED Final   Enterococcus Faecium NOT DETECTED NOT DETECTED Final   Listeria monocytogenes NOT DETECTED NOT DETECTED Final   Staphylococcus species NOT DETECTED NOT DETECTED Final   Staphylococcus aureus (BCID) NOT DETECTED NOT DETECTED Final   Staphylococcus epidermidis NOT DETECTED NOT DETECTED Final   Staphylococcus lugdunensis NOT DETECTED NOT DETECTED Final   Streptococcus species NOT DETECTED NOT DETECTED  Final   Streptococcus agalactiae NOT DETECTED NOT DETECTED Final   Streptococcus pneumoniae NOT DETECTED NOT DETECTED Final   Streptococcus pyogenes NOT DETECTED NOT DETECTED Final   A.calcoaceticus-baumannii NOT DETECTED NOT DETECTED Final   Bacteroides fragilis NOT DETECTED NOT DETECTED Final   Enterobacterales NOT DETECTED NOT DETECTED Final   Enterobacter cloacae complex NOT DETECTED NOT DETECTED Final   Escherichia coli NOT DETECTED NOT DETECTED Final   Klebsiella aerogenes NOT DETECTED NOT DETECTED Final   Klebsiella oxytoca NOT DETECTED NOT DETECTED Final   Klebsiella pneumoniae NOT DETECTED NOT DETECTED Final   Proteus species NOT DETECTED NOT DETECTED Final   Salmonella species NOT DETECTED NOT DETECTED Final   Serratia marcescens NOT DETECTED NOT DETECTED Final   Haemophilus influenzae NOT DETECTED NOT DETECTED Final   Neisseria meningitidis NOT DETECTED NOT DETECTED Final   Pseudomonas aeruginosa NOT DETECTED NOT DETECTED Final   Stenotrophomonas maltophilia NOT DETECTED NOT DETECTED Final   Candida albicans NOT DETECTED NOT DETECTED Final   Candida auris NOT DETECTED NOT DETECTED Final   Candida glabrata NOT DETECTED NOT DETECTED Final   Candida krusei NOT DETECTED NOT DETECTED Final   Candida parapsilosis NOT DETECTED NOT DETECTED Final   Candida tropicalis NOT DETECTED NOT DETECTED Final   Cryptococcus neoformans/gattii NOT DETECTED NOT DETECTED Final    Comment: Performed at [facility], [address], [place], [place] [address]  Surgical pcr screen     Status: None   Collection Time: 02/13/23 10:36 PM   Specimen: Nasal Mucosa; Nasal Swab  Result Value Ref Range Status   MRSA, PCR NEGATIVE NEGATIVE Final   Staphylococcus aureus NEGATIVE NEGATIVE Final    Comment: (NOTE) The Xpert SA Assay (FDA approved for NASAL specimens in patients 65 years of age and older), is one component of a comprehensive surveillance program. It is not intended to diagnose infection  nor to guide or monitor treatment. Performed at [facility], [address], [place], [place] [address]   Aerobic/Anaerobic Culture w Gram Stain (surgical/deep wound)     Status: None   Collection Time: 02/14/23  2:31 PM   Specimen: Path fluid; Body Fluid  Result Value Ref Range Status   Specimen Description   Final    SYNOVIAL RIGHT KNEE Performed at [facility], [address] [place], [place] [address]    Special Requests [facility]  Final  Gram Stain   Final    ABUNDANT WBC PRESENT, PREDOMINANTLY PMN NO ORGANISMS SEEN    Culture   Final    No growth aerobically or anaerobically. Performed at [facility], [address] [place], [place] [address]    Report Status 02/19/2023 FINAL  Final    Labs: CBC: Recent Labs  Lab 04/16/23 1746 04/17/23 0438  WBC 8.6 4.6  NEUTROABS 5.9  --   HGB 12.5* 12.6*  HCT 38.2* 39.2  MCV 88.0 88.3  PLT 314 264   Basic Metabolic Panel: Recent Labs  Lab 04/16/23 1746 04/17/23 0003 04/17/23 0438  NA 132*  --  132*  K 3.3*  --  4.0  CL 93*  --  95*  CO2 29  --  27  GLUCOSE 122*  --  174*  BUN 10  --  10  CREATININE 0.80  --  0.81  CALCIUM 8.8*  --  9.1  MG  --  2.5* 2.5*  PHOS  --  4.6 4.4   Liver Function Tests: Recent Labs  Lab 04/16/23 1746 04/17/23 0438  AST 24 25  ALT 20 20  ALKPHOS 107 106  BILITOT 1.2 1.1  PROT 6.8 7.3  ALBUMIN 3.7 3.9   CBG: No results for input(s): "GLUCAP" in the last 168 hours.  Discharge time spent: greater than 30 minutes.  Signed: Marcelino Duster, MD [facility] 04/17/2023

## 2023-04-17 NOTE — TOC Transition Note (Signed)
Transition of Care [facility]) - CM/SW Discharge Note   Patient Details  Name: Peter Thompson MRN: [id number] Date of Birth: 01-19-1966  [facility] CM/SW Contact:  Lanier Clam, RN Phone Number: 04/17/2023, 12:57 PM   Clinical Narrative: d/c home no orders or needs.            Patient Goals and CMS Choice      Discharge Placement                         Discharge Plan and Services Additional resources added to the After Visit Summary for                                       Social Determinants of Health (SDOH) Interventions SDOH Screenings   Food Insecurity: No Food Insecurity (04/16/2023)  Housing: Low Risk  (04/16/2023)  Transportation Needs: No Transportation Needs (04/16/2023)  Utilities: Not At Risk (04/16/2023)  Social Connections: Unknown (01/24/2022)   Received from [facility]  Tobacco Use: Medium Risk (04/16/2023)     Readmission Risk Interventions    02/15/2023   12:58 PM 02/13/2023    9:41 AM  Readmission Risk Prevention Plan  Post Dischage Appt  Complete  Medication Screening  Complete  Transportation Screening Complete Complete  PCP or Specialist Appt within 5-7 Days Complete   Home Care Screening Complete   Medication Review (RN CM) Complete

## 2023-04-18 ENCOUNTER — Other Ambulatory Visit: Payer: Self-pay

## 2023-04-18 MED ORDER — TRIAMCINOLONE ACETONIDE 0.5 % EX OINT: 1.0000 | TOPICAL_OINTMENT | Freq: Two times a day (BID) | CUTANEOUS | 0 refills | Status: DC

## 2023-04-28 ENCOUNTER — Ambulatory Visit: Payer: Medicaid Other | Admitting: Infectious Diseases

## 2023-05-02 ENCOUNTER — Ambulatory Visit (INDEPENDENT_AMBULATORY_CARE_PROVIDER_SITE_OTHER): Payer: Medicaid Other | Admitting: Infectious Diseases

## 2023-05-02 ENCOUNTER — Encounter: Payer: Self-pay | Admitting: Infectious Diseases

## 2023-05-02 ENCOUNTER — Other Ambulatory Visit: Payer: Self-pay

## 2023-05-02 VITALS — BP 122/79 | HR 77 | Temp 97.3°F | Ht 70.0 in | Wt 238.0 lb

## 2023-05-02 DIAGNOSIS — T8453XD Infection and inflammatory reaction due to internal right knee prosthesis, subsequent encounter: Secondary | ICD-10-CM | POA: Diagnosis present

## 2023-05-02 DIAGNOSIS — Z79899 Other long term (current) drug therapy: Secondary | ICD-10-CM

## 2023-05-02 DIAGNOSIS — I872 Venous insufficiency (chronic) (peripheral): Secondary | ICD-10-CM | POA: Diagnosis not present

## 2023-05-02 DIAGNOSIS — Z96659 Presence of unspecified artificial knee joint: Secondary | ICD-10-CM

## 2023-05-02 DIAGNOSIS — Z8719 Personal history of other diseases of the digestive system: Secondary | ICD-10-CM | POA: Insufficient documentation

## 2023-05-02 NOTE — Progress Notes (Unsigned)
Patient Active Problem List   Diagnosis Date Noted   SBO (small bowel obstruction) (HCC) 04/16/2023   Hypokalemia 04/16/2023   Cirrhosis (HCC) 04/16/2023   Prolonged QT interval 04/16/2023   Medication management 03/23/2023   Infection, Pasteurella 03/23/2023   Infected prosthetic knee joint (HCC) 02/11/2023   Gram-negative bacteremia 02/11/2023   Peripheral vascular disease (HCC) 02/11/2023   Septic arthritis of knee (HCC) 02/10/2023   Chronic tension-type headache, intractable 07/11/2022   Peripheral vascular disease, unspecified (HCC) 04/21/2022   Sensorineural hearing loss, bilateral 10/21/2021   Venous stasis dermatitis of both lower extremities 09/07/2021   Tinea pedis of both feet 09/07/2021   Contusion of left foot 09/07/2021   Umbilical hernia without obstruction and without gangrene 03/04/2021   Abnormal results of liver function studies 01/21/2021   Acute gastritis 01/21/2021   History of arthroscopic procedure on shoulder 01/21/2021   Low back pain 01/21/2021   Other and unspecified hyperlipidemia 01/21/2021   Obesity 01/21/2021   Other specified counseling 01/21/2021   Other, mixed, or unspecified nondependent drug abuse, unspecified 01/21/2021   Psychosocial circumstance 01/21/2021   Concussion    Arthritis    Cervicogenic headache 10/06/2020   Permanent atrial fibrillation (HCC) 07/17/2020   Tinnitus, bilateral    Neck fracture (HCC)    Insomnia    Hypertension    DJD (degenerative joint disease)    Complication of anesthesia    Bursitis    Osteoarthrosis, unspecified whether generalized or localized, other specified sites    Anxiety    Obstructive sleep apnea (adult) (pediatric) 09/04/2019   Obstructive sleep apnea 09/04/2019   Left-sided epistaxis 01/30/2019   Paroxysmal atrial fibrillation (HCC) 12/26/2018   Essential (primary) hypertension 10/17/2018   Anemia 10/17/2018   Essential hypertension 10/17/2018   Persistent atrial fibrillation  (HCC)    Primary osteoarthritis of right knee 10/02/2018   Pre-op evaluation 12/05/2016   Smoking 12/05/2016   Primary localized osteoarthritis of left hip 05/10/2016   Primary osteoarthritis of left hip 05/10/2016    Patient's Medications  New Prescriptions   No medications on file  Previous Medications   ALPRAZOLAM (XANAX) 0.5 MG TABLET    Take 1 mg by mouth at bedtime.   AMLODIPINE (NORVASC) 10 MG TABLET    Take 10 mg by mouth daily.   BUDESONIDE-FORMOTEROL (SYMBICORT) 160-4.5 MCG/ACT INHALER    Inhale 2 puffs into the lungs 2 (two) times daily as needed (for flares).   CLONIDINE (CATAPRES) 0.2 MG TABLET    Take 0.2 mg by mouth 3 (three) times daily.   DOXYCYCLINE (VIBRA-TABS) 100 MG TABLET    Take 1 tablet (100 mg total) by mouth 2 (two) times daily.   ELIQUIS 5 MG TABS TABLET    TAKE ONE TABLET BY MOUTH TWICE DAILY   FUROSEMIDE (LASIX) 80 MG TABLET    Take 80 mg by mouth in the morning.   LABETALOL (NORMODYNE) 300 MG TABLET    Take 1 tablet (300 mg total) by mouth 2 (two) times daily.   MULTIPLE VITAMINS-MINERALS (MULTIVITAMIN WITH MINERALS) TABLET    Take 1 tablet by mouth daily.   OXYCODONE 20 MG TABS    Take 1 tablet (20 mg total) by mouth every 4 (four) hours as needed (severe pain).   PANTOPRAZOLE (PROTONIX) 40 MG TABLET    Take 1 tablet (40 mg total) by mouth daily.   POTASSIUM CHLORIDE (KLOR-CON) 10 MEQ TABLET    Take 10 mEq by mouth daily.   PROAIR  HFA 108 (90 BASE) MCG/ACT INHALER    Inhale 2 puffs into the lungs 4 (four) times daily as needed for wheezing or shortness of breath.   TRIAMCINOLONE OINTMENT (KENALOG) 0.5 %    Apply 1 Application topically 2 (two) times daily.  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  Patient had underwent arthrocentesis at outside [facility] office with WBC 136900, no growth in cultures. Blood cx 5/31 1/2 sets pasteurella multocida. status post I&D on 6/4 with no growth in OR cultures.  Seen by ID and patient was discharged on 6/5 with 6 weeks course of IV ceftriaxone from 6/4.  Completed IV ceftriaxone 7/12 when picc migrated out and unable to use to complete 6 weeks course until 7/16. Patient was started on PO amoxicillin which was eventually changed to PO doxycyline in the context of rash that was not clear if it was related to amoxicillin or not. He has been takingPO doxycycline without any concerns so far.   8/20 Taking PO doxycycline as instructed. Admitted 8/4-8/5 for repair of incarcerated umbilical hernia. He saw surgeon after hospital discharge and no concerns reported and has another appt coming up. No issues with rt knee except mild swelling when walking more.  He has not seen [facility] yet as reports he never got a call from them. He has no complaints otherwise.   Review of Systems: all systems reviewed with pertinent positive and negative as listed above  Past Medical History:  Diagnosis Date   Abnormal results of liver function studies 01/21/2021   Anemia 10/17/2018   Anxiety    Arthritis    Bursitis    Cervicogenic headache 10/06/2020   Complication of anesthesia    "woke up in OR- IV came out."   Concussion    several- head injury- was in mil[facility]   DJD (degenerative joint disease)    Essential hypertension 10/17/2018   History of arthroscopic procedure on shoulder 01/21/2021   Hypertension    Insomnia    Left-sided epistaxis 01/30/2019   Low back pain 01/21/2021   Neck fracture (HCC)    Obesity 01/21/2021   Obstructive sleep apnea 09/04/2019   Osteoarthrosis, unspecified whether generalized or localized, other specified sites    Other and unspecified  hyperlipidemia 01/21/2021   Other specified counseling 01/21/2021   Other, mixed, or unspecified nondependent drug abuse, unspecified 01/21/2021   Paroxysmal atrial fibrillation (HCC) 12/26/2018   Persistent atrial fibrillation (HCC)    Pre-op evaluation 12/05/2016   Primary localized osteoarthritis of left hip 05/10/2016   Primary osteoarthritis of left hip 05/10/2016   Primary osteoarthritis of right knee 10/02/2018   Psychosocial circumstance 01/21/2021   Smoking 12/05/2016   Tinnitus, bilateral    Past Surgical History:  Procedure Laterality Date   ANKLE SURGERY Left    Arm surgery Right    fracture repair   CERVICAL SPINE SURGERY     bone graft from left hip   HAND SURGERY Left    drains for Infection   HIP SURGERY Left    "scrapped" x2   KNEE ARTHROSCOPY Right    KNEE ARTHROSCOPY Right 02/14/2023   Procedure: RIGHT KNEE ARTHROSCOPY WITH IRRIGATION AND  DEBRIDEMENT;  Surgeon: Marcene Corning, MD;  Location: [facility];  Service: Orthopedics;  Laterality: Right;   LEG SURGERY Left    "1 inch took out"   SHOULDER ARTHROSCOPY Left 12/27/2016   Procedure: ARTHROSCOPY SHOULDER;  Surgeon: Marcene Corning, MD;  [facility];  Service: Orthopedics;  Laterality: Left;  Debridement, AC Decompression, Acromioplasty    TOTAL HIP ARTHROPLASTY Left 05/10/2016   Procedure: TOTAL HIP ARTHROPLASTY ANTERIOR APPROACH;  Surgeon: Marcene Corning, MD;  Location: [facility] OR;  Service: Orthopedics;  Laterality: Left;   TOTAL KNEE ARTHROPLASTY Right 10/02/2018   Procedure: TOTAL KNEE ARTHROPLASTY;  Surgeon: Marcene Corning, MD;  Location: [facility] OR;  Service: Orthopedics;  Laterality: Right;   UMBILICAL HERNIA REPAIR N/A 04/16/2023   Procedure: HERNIA REPAIR UMBILICAL ADULT;  Surgeon: Harriette Bouillon, MD;  Location: [facility];  Service: General;  Laterality: N/A;   WRIST SURGERY Right    2 Pinns     Social History   Tobacco Use   Smoking status: Former    Current packs/day: 0.00    Types: Cigarettes    Quit date:  12/25/2020    Years since quitting: 2.3   Smokeless tobacco: Former    Types: Chew   Tobacco comments:    smokes less than 0.5 ppd now  Vaping Use   Vaping status: Never Used  Substance Use Topics   Alcohol use: Yes    Alcohol/week: 12.0 standard drinks of alcohol    Types: 12 Cans of beer per week   Drug use: No    Family History  Problem Relation Age of Onset   Cancer Other    Heart disease Neg Hx    Headache Neg Hx    Migraines Neg Hx     No Known Allergies  Health Maintenance  Topic Date Due   COVID-19 Vaccine (1) Never done   Hepatitis C Screening  Never done   DTaP/Tdap/Td (1 - Tdap) Never done   Colonoscopy  Never done   INFLUENZA VACCINE  04/13/2023   HIV Screening  Completed   Zoster Vaccines- Shingrix  Completed   HPV VACCINES  Aged Out    Objective: BP 122/79   Pulse 77   Temp (!) 97.3 F (36.3 C) (Oral)   Ht 5\' 10"  (1.778 m)   Wt 238 lb (108 kg)   SpO2 100%   BMI 34.15 kg/m   Physical Exam Constitutional:      Appearance: Normal appearance. Appears sleepy HENT:     Head: Normocephalic and atraumatic.      Mouth: Mucous membranes are moist.  Eyes:    Conjunctiva/sclera: Conjunctivae normal.     Pupils: Pupils are equal, round, and bilaterally symmetrical   Cardiovascular:     Rate and Rhythm: Normal rate and Irregular rhythm.     Heart sounds:  Pulmonary:     Effort: Pulmonary effort is normal.     Breath sounds:  Abdominal:     General: Non distended     Palpations:   Musculoskeletal:        General: Normal range of motion. Chronic venous stasis changes in lower extremities/skin thickening and hyperpigmentation. Rt knee surgical site has healed with no signs of septic arthritis    Skin:    General: Skin is warm and dry.     Comments: Umbilical surgical site has almost healed with no signs of infection   Neurological:     General: grossly non focal     Mental Status: awake, alert and oriented  to person, place, and  time.  Psychiatric:        Mood and Affect: Mood normal.   Lab Results Lab Results  Component Value Date   WBC 4.6 04/17/2023   HGB 12.6 (L) 04/17/2023   HCT 39.2 04/17/2023   MCV 88.3 04/17/2023   PLT 264 04/17/2023    Lab Results  Component Value Date   CREATININE 0.81 04/17/2023   BUN 10 04/17/2023   NA 132 (L) 04/17/2023   K 4.0 04/17/2023   CL 95 (L) 04/17/2023   CO2 27 04/17/2023    Lab Results  Component Value Date   ALT 20 04/17/2023   AST 25 04/17/2023   ALKPHOS 106 04/17/2023   BILITOT 1.1 04/17/2023    Lab Results  Component Value Date   CHOL 130 01/29/2021   HDL 36 (L) 01/29/2021   LDLCALC 72 01/29/2021   TRIG 120 01/29/2021   CHOLHDL 3.6 01/29/2021   No results found for: "LABRPR", "RPRTITER" No results found for: "HIV1RNAQUANT", "HIV1RNAVL", "CD4TABS"   Assessment/plan # Right knee PJI - Complete 6 weeks of IV ceftriaxone 7/16 then start amoxicillin 1g po tid thereafter, however switched to PO doxycyline on - 7/22 in the context of rash although it was doubtful at all if related to amoxicillin   Plan  - Continue PO doxycycline for 6 months, likely end jan 2025 - Fu in 2 months   # Umbilical hernia repair - surgical site healing as expected post op  - Fu with surgery   # Medication management  - Labs today   # PICC  - Removed   # Chronic venous stasis dermatitis/PVD - Amb referral to [facility] placed last visit. He has an appt with [facility] on 9/5   I have personally spent 41 minutes involved in face-to-face and non-face-to-face activities for this patient on the day of the visit. Professional time spent includes the following activities: Preparing to see the patient (review of tests), Obtaining and/or reviewing separately obtained history (admission/discharge record), Performing a medically appropriate examination and/or evaluation , Ordering medications/tests/procedures, referring and communicating with other health care professionals,  Documenting clinical information in the EMR, Independently interpreting results (not separately reported), Communicating results to the patient/family/caregiver, Counseling and educating the patient/family/caregiver and Care coordination (not separately reported).   Victoriano Lain, MD [facility] 05/02/2023, 9:07 AM

## 2023-05-08 ENCOUNTER — Other Ambulatory Visit: Payer: Self-pay | Admitting: *Deleted

## 2023-05-08 DIAGNOSIS — I872 Venous insufficiency (chronic) (peripheral): Secondary | ICD-10-CM

## 2023-05-17 ENCOUNTER — Telehealth: Payer: Self-pay

## 2023-05-17 NOTE — Telephone Encounter (Signed)
Patient's wife called, states Peter Thompson had labs done at [facility] and they were wondering if provider has reviewed these results. They would like explanation of results. Will route to provider.   Sandie Ano, RN

## 2023-05-18 ENCOUNTER — Ambulatory Visit (HOSPITAL_COMMUNITY): Payer: Medicaid Other

## 2023-05-25 ENCOUNTER — Telehealth: Payer: Self-pay

## 2023-05-25 NOTE — Telephone Encounter (Signed)
Patient wife Lupita Leash called requesting lab results done on 8/23. Lupita Leash also questioned if patient needed to continue taking doxycycline - I told her patient would need to continue taking doxy until appointment with Dr.Manandhar on 10/24.

## 2023-05-25 NOTE — Telephone Encounter (Signed)
Patient wife aware and relay message to patient.   [name] [name], CMA

## 2023-05-31 ENCOUNTER — Other Ambulatory Visit: Payer: Self-pay

## 2023-05-31 MED ORDER — TRIAMCINOLONE ACETONIDE 0.5 % EX OINT: 1.0000 | TOPICAL_OINTMENT | Freq: Two times a day (BID) | CUTANEOUS | 0 refills | Status: DC

## 2023-06-01 ENCOUNTER — Other Ambulatory Visit: Payer: Self-pay

## 2023-06-01 ENCOUNTER — Other Ambulatory Visit: Payer: Self-pay | Admitting: Internal Medicine

## 2023-07-04 ENCOUNTER — Telehealth: Payer: Self-pay

## 2023-07-04 NOTE — Telephone Encounter (Signed)
Patient's wife called wanting to know if provider needs any labs done prior to Yossef's appointment on Thursday. If so, they would like to go ahead and have these done at the [facility] in [place].   Sandie Ano, RN

## 2023-07-05 NOTE — Telephone Encounter (Addendum)
Spoke with Lupita Leash, she says Shadon is not going to make it to his appointment tomorrow. She would like further explanation as to why he needs to come in, advised that provider would like to assess how he's doing. Lupita Leash says "how are they going to know without labs?" and says "they're just going to tell him he needs labs and then make him come back again later."   They live an hour away and it is not easy to bring Terryn in with [name]'s work schedule since she drives him. Offered to reschedule them for next week some time, Lupita Leash states "we'll see."  Sandie Ano, RN

## 2023-07-05 NOTE — Telephone Encounter (Signed)
Spoke with Lupita Leash, notified her that lab orders have been sent to [facility].   [address] [place], [place] [address] Phone: [phone] Fax: [phone]  Peter Thompson appointment is scheduled for tomorrow, Lupita Leash would like to reschedule. States they won't be able to get his lab work done today because he has another appointment. Tried to help her reschedule for later this week or early next week.   None of the available slots work with her work schedule and they live an hour away. She would like to do a virtual visit if possible. Will route to provider.   Sandie Ano, RN

## 2023-07-06 ENCOUNTER — Ambulatory Visit: Payer: Medicaid Other | Admitting: Infectious Diseases

## 2023-07-06 NOTE — Telephone Encounter (Signed)
Spoke with Lupita Leash and scheduled virtual visit for next week. She knows to have Davari's labs done prior to visit.   Sandie Ano, RN

## 2023-07-10 ENCOUNTER — Other Ambulatory Visit: Payer: Self-pay | Admitting: Internal Medicine

## 2023-07-10 NOTE — Telephone Encounter (Signed)
Appt 10/29 

## 2023-07-11 ENCOUNTER — Telehealth: Payer: Medicaid Other | Admitting: Infectious Diseases

## 2023-07-11 ENCOUNTER — Telehealth: Payer: Self-pay | Admitting: Infectious Diseases

## 2023-07-11 ENCOUNTER — Other Ambulatory Visit: Payer: Self-pay

## 2023-07-11 DIAGNOSIS — T8459XD Infection and inflammatory reaction due to other internal joint prosthesis, subsequent encounter: Secondary | ICD-10-CM

## 2023-07-11 DIAGNOSIS — Z79899 Other long term (current) drug therapy: Secondary | ICD-10-CM

## 2023-07-11 NOTE — Telephone Encounter (Signed)
Peter Thompson stated he is having trouble getting his last two lab results. He was told to go online but is unable to access it. He is hoping this can be mailed or sent electronically. Patient can be reached at [phone] or on wife's line [phone].

## 2023-07-11 NOTE — Progress Notes (Unsigned)
Virtual Visit via Video Note  I connected withNAME@ on 07/11/23 at 11:30 AM EDT by a video enabled telemedicine application and verified that I am speaking with the correct person using two identifiers.  Location: Patient: outside of home  Provider: [facility]   I discussed the limitations of evaluation and management by telemedicine and the availability of in person appointments. The patient expressed understanding and agreed to proceed.  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   Gram-negative bacteremia 02/11/2023   Peripheral vascular disease (HCC) 02/11/2023   Septic arthritis of knee (HCC) 02/10/2023   Chronic tension-type headache, intractable 07/11/2022   Peripheral vascular disease, unspecified (HCC) 04/21/2022   Sensorineural hearing loss, bilateral 10/21/2021   Venous stasis dermatitis of both lower extremities 09/07/2021   Tinea pedis of both feet 09/07/2021   Contusion of left foot 09/07/2021   Umbilical hernia without obstruction and without gangrene 03/04/2021   Abnormal results of liver function studies 01/21/2021   Acute gastritis 01/21/2021   History of arthroscopic procedure on shoulder 01/21/2021   Low back pain 01/21/2021   Other and unspecified hyperlipidemia 01/21/2021   Obesity 01/21/2021   Other specified counseling 01/21/2021   Other, mixed, or unspecified nondependent drug abuse, unspecified 01/21/2021   Psychosocial circumstance 01/21/2021   Concussion    Arthritis    Cervicogenic headache 10/06/2020   Permanent atrial fibrillation (HCC) 07/17/2020   Tinnitus, bilateral    Neck fracture (HCC)    Insomnia     Hypertension    DJD (degenerative joint disease)    Complication of anesthesia    Bursitis    Osteoarthrosis, unspecified whether generalized or localized, other specified sites    Anxiety    Obstructive sleep apnea (adult) (pediatric) 09/04/2019   Obstructive sleep apnea 09/04/2019   Left-sided epistaxis 01/30/2019   Paroxysmal atrial fibrillation (HCC) 12/26/2018   Essential (primary) hypertension 10/17/2018   Anemia 10/17/2018   Essential hypertension 10/17/2018   Persistent atrial fibrillation (HCC)    Primary osteoarthritis of right knee 10/02/2018   Pre-op evaluation 12/05/2016   Smoking 12/05/2016   Primary localized osteoarthritis of left hip 05/10/2016   Primary osteoarthritis of left hip 05/10/2016    Patient's Medications  New Prescriptions   No medications on file  Previous Medications   ALPRAZOLAM (XANAX) 0.5 MG TABLET    Take 1 mg by mouth at bedtime.   AMLODIPINE (NORVASC) 10 MG TABLET    Take 10 mg by mouth daily.   BUDESONIDE-FORMOTEROL (SYMBICORT) 160-4.5 MCG/ACT INHALER    Inhale 2 puffs into the lungs 2 (two) times daily as needed (for flares).   CLONIDINE (CATAPRES) 0.2 MG TABLET    Take 0.2 mg by mouth 3 (three) times daily.   DOXYCYCLINE (VIBRA-TABS) 100 MG TABLET    Take 1 tablet (100 mg total) by mouth 2 (two) times daily.   ELIQUIS 5 MG TABS TABLET    TAKE ONE TABLET BY MOUTH TWICE DAILY   FUROSEMIDE (LASIX) 80 MG TABLET    Take 80 mg by mouth in the morning.   LABETALOL (NORMODYNE) 300 MG TABLET    Take 1 tablet (300 mg total) by mouth 2 (two) times daily.  MULTIPLE VITAMINS-MINERALS (MULTIVITAMIN WITH MINERALS) TABLET    Take 1 tablet by mouth daily.   OXYCODONE 20 MG TABS    Take 1 tablet (20 mg total) by mouth every 4 (four) hours as needed (severe pain).   PANTOPRAZOLE (PROTONIX) 40 MG TABLET    Take 1 tablet (40 mg total) by mouth daily.   POTASSIUM CHLORIDE (KLOR-CON) 10 MEQ TABLET    Take 10 mEq by mouth daily.   PROAIR HFA 108 (90 BASE)  MCG/ACT INHALER    Inhale 2 puffs into the lungs 4 (four) times daily as needed for wheezing or shortness of breath.   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  Patient had underwent arthrocentesis at outside [facility] office with WBC 136900, no growth in cultures. Blood cx 5/31 1/2 sets pasteurella multocida. status post I&D on 6/4 with no growth in OR cultures.  Seen by ID and patient was discharged on 6/5 with 6 weeks course of IV ceftriaxone from 6/4.  Completed IV ceftriaxone 7/12 when picc migrated out and unable to use to complete 6 weeks course until 7/16. Patient was started on PO amoxicillin which was eventually changed to PO doxycyline in the context of rash that was not clear if it was related to amoxicillin or not. He has been taking PO doxycycline without any concerns so far.   Admitted 8/4-8/5 for repair of incarcerated umbilical hernia.   10/29 Denies concerns at knee, misses doxycycline once in a while but reports compliance for the most part. Denies any concerns with the abtx. He is wondering  how long to take abtx and wants to stop it as soon as possible. I discussed with him given prosthetic joint infection it is usually treated longer than a native joint infection and plan is to treat him with PO doxycycline for suppression for at least 6 months. He seems to be agreeable. I reviewed his blood work on 10/25. No complaints otherwise.   ROS - Denies fevers, chills. Denies N/V/D  Past Medical History:  Diagnosis Date   Abnormal  results of liver function studies 01/21/2021   Anemia 10/17/2018   Anxiety    Arthritis    Bursitis    Cervicogenic headache 10/06/2020   Complication of anesthesia    "woke up in OR- IV came out."   Concussion    several- head injury- was in [facility]   DJD (degenerative joint disease)    Essential hypertension 10/17/2018   History of arthroscopic procedure on shoulder 01/21/2021   Hypertension    Insomnia    Left-sided epistaxis 01/30/2019   Low back pain 01/21/2021   Neck fracture (HCC)    Obesity 01/21/2021   Obstructive sleep apnea 09/04/2019   Osteoarthrosis, unspecified whether generalized or localized, other specified sites    Other and unspecified hyperlipidemia 01/21/2021   Other specified counseling 01/21/2021   Other, mixed, or unspecified nondependent drug abuse, unspecified 01/21/2021   Paroxysmal atrial fibrillation (HCC) 12/26/2018   Persistent atrial fibrillation (HCC)    Pre-op evaluation 12/05/2016   Primary localized osteoarthritis of  left hip 05/10/2016   Primary osteoarthritis of left hip 05/10/2016   Primary osteoarthritis of right knee 10/02/2018   Psychosocial circumstance 01/21/2021   Smoking 12/05/2016   Tinnitus, bilateral    Past Surgical History:  Procedure Laterality Date   ANKLE SURGERY Left    Arm surgery Right    fracture repair   CERVICAL SPINE SURGERY     bone graft from left hip   HAND SURGERY Left    drains for Infection   HIP SURGERY Left    "scrapped" x2   KNEE ARTHROSCOPY Right    KNEE ARTHROSCOPY Right 02/14/2023   Procedure: RIGHT KNEE ARTHROSCOPY WITH IRRIGATION AND DEBRIDEMENT;  Surgeon: Marcene Corning, MD;  Location: [facility];  Service: Orthopedics;  Laterality: Right;   LEG SURGERY Left    "1 inch took out"   SHOULDER ARTHROSCOPY Left 12/27/2016   Procedure: ARTHROSCOPY SHOULDER;  Surgeon: Marcene Corning, MD;  [facility]: [facility] OR;  Service: Orthopedics;  Laterality: Left;  Debridement, AC Decompression, Acromioplasty    TOTAL HIP ARTHROPLASTY Left  05/10/2016   Procedure: TOTAL HIP ARTHROPLASTY ANTERIOR APPROACH;  Surgeon: Marcene Corning, MD;  Location: [facility] OR;  Service: Orthopedics;  Laterality: Left;   TOTAL KNEE ARTHROPLASTY Right 10/02/2018   Procedure: TOTAL KNEE ARTHROPLASTY;  Surgeon: Marcene Corning, MD;  Location: [facility] OR;  Service: Orthopedics;  Laterality: Right;   UMBILICAL HERNIA REPAIR N/A 04/16/2023   Procedure: HERNIA REPAIR UMBILICAL ADULT;  Surgeon: Harriette Bouillon, MD;  Location: [facility];  Service: General;  Laterality: N/A;   WRIST SURGERY Right    2 Pinns    Social History   Tobacco Use   Smoking status: Former    Current packs/day: 0.00    Types: Cigarettes    Quit date: 12/25/2020    Years since quitting: 2.5   Smokeless tobacco: Former    Types: Chew   Tobacco comments:    smokes less than 0.5 ppd now  Vaping Use   Vaping status: Never Used  Substance Use Topics   Alcohol use: Yes    Alcohol/week: 12.0 standard drinks of alcohol    Types: 12 Cans of beer per week   Drug use: No    Family History  Problem Relation Age of Onset   Cancer Other    Heart disease Neg Hx    Headache Neg Hx    Migraines Neg Hx     No Known Allergies  Health Maintenance  Topic Date Due   COVID-19 Vaccine (1) Never done   Hepatitis C Screening  Never done   DTaP/Tdap/Td (1 - Tdap) Never done   Colonoscopy  Never done   INFLUENZA VACCINE  04/13/2023   HIV Screening  Completed   Zoster Vaccines- Shingrix  Completed   HPV VACCINES  Aged Out    Observations/Objective: Standing and appears comfortable  Assessment and Plan: # Right knee PJI - Completed 6 weeks of IV ceftriaxone 7/16 then start amoxicillin 1g po tid thereafter, however switched to PO doxycyline on - 7/22 in the context of rash although it was doubtful at all if related to amoxicillin    Plan  - Continue PO doxycycline for 6 months, likely end jan 2025. He has enough refills.  - Fu in 3 months    # Medication management  - Labs 10/25 hb 12.8, wbc 5,  plts 217, cr 0.91, crp 4  # Chronic venous stasis dermatitis/PVD - Fu with Vascular   Follow Up Instructions: 3 months    I  discussed the assessment and treatment plan with the patient. The patient was provided an opportunity to ask questions and all were answered. The patient agreed with the plan and demonstrated an understanding of the instructions.   The patient was advised to call back or seek an in-person evaluation if the symptoms worsen or if the condition fails to improve as anticipated.  I provided 30 minutes of non-face-to-face time during this encounter.  Victoriano Lain, MD [facility] [phone] pager   [phone] cell 07/11/2023, 12:24 PM

## 2023-07-11 NOTE — Telephone Encounter (Signed)
Returned patient call - patient requested for labs to be mailed to him. Sent.    Jonne Rote [name], CMA

## 2023-07-12 NOTE — Telephone Encounter (Signed)
REFILL 

## 2023-07-13 NOTE — Telephone Encounter (Signed)
I have not prescribed it and he should request it with the provider who prescribed.

## 2023-08-02 NOTE — Telephone Encounter (Signed)
Patient informed and verbalized understanding.  Amar Sippel Jonathon Resides, CMA

## 2023-08-07 ENCOUNTER — Ambulatory Visit (HOSPITAL_COMMUNITY): Payer: Medicaid Other

## 2023-09-25 ENCOUNTER — Other Ambulatory Visit: Payer: Self-pay

## 2023-09-25 DIAGNOSIS — Z79899 Other long term (current) drug therapy: Secondary | ICD-10-CM

## 2023-09-25 DIAGNOSIS — T8459XD Infection and inflammatory reaction due to other internal joint prosthesis, subsequent encounter: Secondary | ICD-10-CM

## 2023-10-03 ENCOUNTER — Telehealth: Payer: Medicaid Other | Admitting: Infectious Diseases

## 2023-10-24 LAB — SEDIMENTATION RATE: Sed Rate: 34 mm/h — ABNORMAL HIGH (ref 0–30)

## 2023-10-25 ENCOUNTER — Other Ambulatory Visit: Payer: Self-pay

## 2023-10-25 ENCOUNTER — Telehealth (INDEPENDENT_AMBULATORY_CARE_PROVIDER_SITE_OTHER): Payer: Medicaid Other | Admitting: Infectious Diseases

## 2023-10-25 ENCOUNTER — Encounter: Payer: Self-pay | Admitting: Infectious Diseases

## 2023-10-25 DIAGNOSIS — T8453XD Infection and inflammatory reaction due to internal right knee prosthesis, subsequent encounter: Secondary | ICD-10-CM

## 2023-10-25 DIAGNOSIS — Z5181 Encounter for therapeutic drug level monitoring: Secondary | ICD-10-CM | POA: Insufficient documentation

## 2023-10-25 DIAGNOSIS — Z96659 Presence of unspecified artificial knee joint: Secondary | ICD-10-CM

## 2023-10-25 NOTE — Progress Notes (Addendum)
Virtual Visit via Video Note  I connected withNAME@ on 10/25/23 at 10:45 AM EST by a video enabled telemedicine application and verified that I am speaking with the correct person using two identifiers.  Location: Patient: Home Provider: [facility]   I discussed the limitations of evaluation and management by telemedicine and the availability of in person appointments. The patient expressed understanding and agreed to proceed.  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   Gram-negative bacteremia 02/11/2023   Peripheral vascular disease (HCC) 02/11/2023   Septic arthritis of knee (HCC) 02/10/2023   Chronic tension-type headache, intractable 07/11/2022   Peripheral vascular disease, unspecified (HCC) 04/21/2022   Sensorineural hearing loss, bilateral 10/21/2021   Venous stasis dermatitis of both lower extremities 09/07/2021   Tinea pedis of both feet 09/07/2021   Contusion of left foot 09/07/2021   Umbilical hernia without obstruction and without gangrene 03/04/2021   Abnormal results of liver function studies 01/21/2021   Acute gastritis 01/21/2021   History of arthroscopic procedure on shoulder 01/21/2021   Low back pain 01/21/2021   Other and unspecified hyperlipidemia 01/21/2021   Obesity 01/21/2021   Other specified counseling 01/21/2021   Other, mixed, or unspecified nondependent drug abuse, unspecified 01/21/2021   Psychosocial circumstance 01/21/2021   Concussion    Arthritis    Cervicogenic headache 10/06/2020   Permanent atrial fibrillation (HCC) 07/17/2020   Tinnitus, bilateral    Neck fracture (HCC)    Insomnia     Hypertension    DJD (degenerative joint disease)    Complication of anesthesia    Bursitis    Osteoarthrosis, unspecified whether generalized or localized, other specified sites    Anxiety    Obstructive sleep apnea (adult) (pediatric) 09/04/2019   Obstructive sleep apnea 09/04/2019   Left-sided epistaxis 01/30/2019   Paroxysmal atrial fibrillation (HCC) 12/26/2018   Essential (primary) hypertension 10/17/2018   Anemia 10/17/2018   Essential hypertension 10/17/2018   Persistent atrial fibrillation (HCC)    Primary osteoarthritis of right knee 10/02/2018   Pre-op evaluation 12/05/2016   Smoking 12/05/2016   Primary localized osteoarthritis of left hip 05/10/2016   Primary osteoarthritis of left hip 05/10/2016   Current Outpatient Medications on File Prior to Visit  Medication Sig Dispense Refill   ALPRAZolam (XANAX) 0.5 MG tablet Take 1 mg by mouth at bedtime.     amLODipine (NORVASC) 10 MG tablet Take 10 mg by mouth daily.     cloNIDine (CATAPRES) 0.2 MG tablet Take 0.2 mg by mouth 3 (three) times daily.     doxycycline (VIBRA-TABS) 100 MG tablet Take 1 tablet (100 mg total) by mouth 2 (two) times daily. 60 tablet 5   ELIQUIS 5 MG TABS tablet TAKE ONE TABLET BY MOUTH TWICE DAILY 180 tablet 1   furosemide (LASIX) 80 MG tablet Take 80 mg by mouth in the morning.     labetalol (NORMODYNE) 300 MG tablet Take 1 tablet (300 mg total) by mouth 2 (two) times daily. 60 tablet 0   Multiple Vitamins-Minerals (MULTIVITAMIN WITH MINERALS) tablet Take 1 tablet by mouth daily.     oxyCODONE 20 MG TABS Take 1 tablet (20 mg total) by mouth every 4 (four)  hours as needed (severe pain). (Patient taking differently: Take 20 mg by mouth every 4 (four) hours.) 60 tablet 0   potassium chloride (KLOR-CON) 10 MEQ tablet Take 10 mEq by mouth daily.     PROAIR HFA 108 (90 Base) MCG/ACT inhaler Inhale 2 puffs into the lungs 4 (four) times daily as needed for wheezing or shortness of breath.     triamcinolone  ointment (KENALOG) 0.5 % APPLY TO AFFECTED AREA TWICE A DAY 30 g 0   No current facility-administered medications on file prior to visit.   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  Patient had underwent arthrocentesis at outside [facility] office with WBC 136900, no growth in cultures. Blood cx 5/31 1/2 sets pasteurella multocida. status post I&D on 6/4 with no growth in OR cultures.  Seen by ID and patient was discharged on 6/5 with 6 weeks course of IV ceftriaxone from 6/4.  Completed IV ceftriaxone 7/12 when picc migrated out and unable to use to complete 6 weeks course until 7/16. Patient was started on PO amoxicillin which was eventually changed to PO doxycyline in the context of rash that was not clear if it was related to amoxicillin or not. He has been taking PO doxycycline without any concerns so far.    Admitted 8/4-8/5 for repair of incarcerated umbilical hernia.    10/29 Denies concerns at knee, misses doxycycline once in a while but reports compliance for the most part. Denies any concerns with the abtx. He is wondering  how long to take abtx and wants to stop it as soon as possible. I discussed with him given prosthetic joint infection it is usually treated longer than a native joint infection and plan is to treat him with PO doxycycline for suppression for at least 6 months. He seems to be agreeable. I reviewed his blood work on 10/25. No complaints otherwise.    10/25/23 Taking doxycycline as prescribed but has missed 1-3 days in a month. No concerns in his knees and doing well. Lab work was done 2/10, incomplete results ESR 34. He has no complaints today.   ROS: Denies fevers, chills. Denies nausea, vomiting and  diarrhea.   Past Medical History:  Diagnosis Date   Abnormal results of liver function studies 01/21/2021   Anemia 10/17/2018   Anxiety    Arthritis    Bursitis    Cervicogenic headache 10/06/2020   Complication of anesthesia    "woke up in OR- IV came out."   Concussion    several- head injury- was in [facility]   DJD (degenerative joint disease)    Essential hypertension 10/17/2018   History of arthroscopic procedure on shoulder 01/21/2021   Hypertension    Insomnia    Left-sided epistaxis 01/30/2019   Low back pain 01/21/2021   Neck fracture (HCC)    Obesity 01/21/2021   Obstructive sleep apnea 09/04/2019   Osteoarthrosis, unspecified whether generalized or localized, other specified sites    Other and unspecified hyperlipidemia 01/21/2021   Other specified counseling 01/21/2021   Other, mixed, or unspecified nondependent drug abuse, unspecified 01/21/2021   Paroxysmal atrial fibrillation (HCC) 12/26/2018   Persistent atrial fibrillation (HCC)    Pre-op evaluation 12/05/2016   Primary localized osteoarthritis of left hip 05/10/2016   Primary  osteoarthritis of left hip 05/10/2016   Primary osteoarthritis of right knee 10/02/2018   Psychosocial circumstance 01/21/2021   Smoking 12/05/2016   Tinnitus, bilateral     Social History   Tobacco Use   Smoking status: Former    Current packs/day: 0.00    Types: Cigarettes    Quit date: 12/25/2020    Years since quitting: 2.8   Smokeless tobacco: Former    Types: Chew   Tobacco comments:    smokes less than 0.5 ppd now  Vaping Use   Vaping status: Never Used  Substance Use Topics   Alcohol use: Yes    Alcohol/week: 12.0 standard drinks of alcohol    Types: 12 Cans of beer per week   Drug use: No    Family History  Problem Relation Age of Onset   Cancer Other    Heart disease Neg Hx    Headache Neg Hx    Migraines Neg Hx     No Known Allergies  Health Maintenance  Topic Date Due   Pneumococcal Vaccine 67-73 Years old (1 of 2 -  PCV) Never done   Hepatitis C Screening  Never done   DTaP/Tdap/Td (1 - Tdap) Never done   Colonoscopy  Never done   INFLUENZA VACCINE  04/13/2023   COVID-19 Vaccine (5 - 2024-25 season) 11/05/2023   HIV Screening  Completed   Zoster Vaccines- Shingrix  Completed   HPV VACCINES  Aged Out    Observations/Objective:   Assessment and Plan:  Follow Up Instructions: # Right knee PJI - Completed 6 weeks of IV ceftriaxone 7/16 then started on amoxicillin 1g po tid thereafter, however switched to PO doxycyline on 7/22 in the context of rash (although it was doubtful at all if related to amoxicillin ) which he has been on until today   Plan  - DC doxycycline, has 6 + months of PO antibiotic suppression. CRP has normalized in 07/07/23 - Fu as needed    # Medication management  - ESR 34, rest of the labs pending.    # Chronic venous stasis dermatitis/PVD - Fu with Vascular    I discussed the assessment and treatment plan with the patient. The patient was provided an opportunity to ask questions and all were answered. The patient agreed with the plan and demonstrated an understanding of the instructions.   The patient was advised to call back or seek an in-person evaluation if the symptoms worsen or if the condition fails to improve as anticipated.  I provided 21 minutes of non-face-to-face time during this encounter.  Of note, portions of this note may have been created with voice recognition software. While this note has been edited for accuracy, occasional wrong-word or 'sound-a-like' substitutions may have occurred due to the inherent limitations of voice recognition software.   Victoriano Lain, MD [facility] [phone] pager   [phone] cell 10/25/2023, 10:55 AM

## 2023-10-30 ENCOUNTER — Encounter (HOSPITAL_COMMUNITY): Payer: Medicaid Other

## 2023-11-10 ENCOUNTER — Ambulatory Visit (HOSPITAL_COMMUNITY)
Admission: RE | Admit: 2023-11-10 | Discharge: 2023-11-10 | Disposition: A | Discharge status: Home / Self Care | Payer: Medicaid Other | Source: Ambulatory Visit | Attending: Physician Assistant | Admitting: Physician Assistant

## 2023-11-10 ENCOUNTER — Ambulatory Visit (INDEPENDENT_AMBULATORY_CARE_PROVIDER_SITE_OTHER): Payer: Medicaid Other | Admitting: Physician Assistant

## 2023-11-10 VITALS — BP 132/82 | HR 81 | Temp 98.7°F | Resp 24 | Ht 70.0 in | Wt 269.0 lb

## 2023-11-10 DIAGNOSIS — I872 Venous insufficiency (chronic) (peripheral): Secondary | ICD-10-CM

## 2023-11-10 NOTE — Progress Notes (Signed)
 [facility] [place]   Reason for referral: Swollen B legs  History of Present Illness  Peter Thompson is a 58 y.o. male who presents with chief complaint: swollen leg.  Patient notes, onset of swelling multiple years ago, associated with multiple LE surgeries, prolonged sitting in a dependent position and prolonged standing.  The patient has had no history of DVT, no history of varicose vein, positive history of venous stasis ulcers, no history of  Lymphedema and positive history of skin changes in lower legs.  There is unknown family history of venous disorders.  The patient has tried knee high compression stockings in the past.  He is not very active due to multiple surgeries and being a Vet.  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  He is ambulatory without assistive devices.  He currently doe not have any open wounds.  He does have some erythema on the medial left foot he states he dropped a weight on his foot lat week.      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  He is reported to have PVD as well.  He ahs been followed in the past with ABI by Dr.   Bing Matter cardiologist.  The last ABI was 12/21/21.  He denies claudication, frank rest pain and non healing foot/toe wounds.      Past Medical History:  Diagnosis Date   Abnormal results of liver function studies 01/21/2021   Anemia 10/17/2018   Anxiety    Arthritis    Bursitis    Cervicogenic headache 10/06/2020   Complication of anesthesia    "woke up in OR- IV came out."   Concussion    several- head injury- was in mil[facility]   DJD (degenerative joint disease)    Essential hypertension 10/17/2018   History of arthroscopic procedure on shoulder 01/21/2021   Hypertension    Insomnia    Left-sided epistaxis 01/30/2019   Low back pain 01/21/2021   Neck fracture (HCC)    Obesity 01/21/2021   Obstructive sleep apnea 09/04/2019    Osteoarthrosis, unspecified whether generalized or localized, other specified sites    Other and unspecified hyperlipidemia 01/21/2021   Other specified counseling 01/21/2021   Other, mixed, or unspecified nondependent drug abuse, unspecified 01/21/2021   Paroxysmal atrial fibrillation (HCC) 12/26/2018   Persistent atrial fibrillation (HCC)    Pre-op evaluation 12/05/2016   Primary localized osteoarthritis of left hip 05/10/2016   Primary osteoarthritis of left hip 05/10/2016   Primary osteoarthritis of right knee 10/02/2018   Psychosocial circumstance 01/21/2021   Smoking 12/05/2016   Tinnitus, bilateral     Past Surgical History:  Procedure Laterality Date   ANKLE SURGERY Left    Arm surgery Right    fracture repair   CERVICAL SPINE SURGERY     bone graft from left hip   HAND SURGERY Left    drains for Infection   HIP SURGERY Left    "scrapped" x2   KNEE ARTHROSCOPY Right    KNEE ARTHROSCOPY Right 02/14/2023   Procedure: RIGHT KNEE ARTHROSCOPY WITH IRRIGATION AND DEBRIDEMENT;  Surgeon: Marcene Corning, MD;  Location: [facility];  Service: Orthopedics;  Laterality: Right;   LEG SURGERY Left    "1 inch took out"   SHOULDER ARTHROSCOPY Left 12/27/2016   Procedure: ARTHROSCOPY SHOULDER;  Surgeon: Marcene Corning, MD;  Location: [facility] OR;  Service: Orthopedics;  Laterality: Left;  Debridement, AC Decompression,  Acromioplasty    TOTAL HIP ARTHROPLASTY Left 05/10/2016   Procedure: TOTAL HIP ARTHROPLASTY ANTERIOR APPROACH;  Surgeon: Marcene Corning, MD;  Location: [facility] OR;  Service: Orthopedics;  Laterality: Left;   TOTAL KNEE ARTHROPLASTY Right 10/02/2018   Procedure: TOTAL KNEE ARTHROPLASTY;  Surgeon: Marcene Corning, MD;  Location: [facility] OR;  Service: Orthopedics;  Laterality: Right;   UMBILICAL HERNIA REPAIR N/A 04/16/2023   Procedure: HERNIA REPAIR UMBILICAL ADULT;  Surgeon: Harriette Bouillon, MD;  Location: [facility];  Service: General;  Laterality: N/A;   WRIST SURGERY Right    2 Pinns    Social History    Socioeconomic History   Marital status: Legally Separated    Spouse name: Not on file   Number of children: 0   Years of education: Not on file   Highest education level: Some college, no degree  Occupational History   Not on file  Tobacco Use   Smoking status: Former    Current packs/day: 0.00    Types: Cigarettes    Quit date: 12/25/2020    Years since quitting: 2.8   Smokeless tobacco: Former    Types: Chew   Tobacco comments:    smokes less than 0.5 ppd now  Vaping Use   Vaping status: Never Used  Substance and Sexual Activity   Alcohol use: Yes    Alcohol/week: 12.0 standard drinks of alcohol    Types: 12 Cans of beer per week   Drug use: No   Sexual activity: Not on file  Other Topics Concern   Not on file  Social History Narrative   Lives alone   "ambidextrous, write with my right hand"   Caffeine: 2 cups coffee/day   Social Drivers of Health   Financial Resource Strain: Not on file  Food Insecurity: No Food Insecurity (04/16/2023)   Hunger Vital Sign    Worried About Running Out of Food in the Last Year: Never true    Ran Out of Food in the Last Year: Never true  Transportation Needs: No Transportation Needs (04/16/2023)   PRAPARE - Administrator, Civil Service (Medical): No    Lack of Transportation (Non-Medical): No  Physical Activity: Not on file  Stress: Not on file  Social Connections: Unknown (01/24/2022)   Received from [facility]   Social Network    Social Network: Not on file  Intimate Partner Violence: Not At Risk (04/16/2023)   Humiliation, Afraid, Rape, and Kick questionnaire    Fear of Current or Ex-Partner: No    Emotionally Abused: No    Physically Abused: No    Sexually Abused: No    Family History  Problem Relation Age of Onset   Cancer Other    Heart disease Neg Hx    Headache Neg Hx    Migraines Neg Hx     Current Outpatient Medications on File Prior to Visit  Medication Sig Dispense Refill    ALPRAZolam (XANAX) 0.5 MG tablet Take 1 mg by mouth at bedtime.     amLODipine (NORVASC) 10 MG tablet Take 10 mg by mouth daily.     cloNIDine (CATAPRES) 0.2 MG tablet Take 0.2 mg by mouth 3 (three) times daily.     doxycycline (VIBRA-TABS) 100 MG tablet Take 1 tablet (100 mg total) by mouth 2 (two) times daily. 60 tablet 5   ELIQUIS 5 MG TABS tablet TAKE ONE TABLET BY MOUTH TWICE DAILY 180 tablet 1   furosemide (LASIX) 80 MG tablet Take 80  mg by mouth in the morning.     labetalol (NORMODYNE) 300 MG tablet Take 1 tablet (300 mg total) by mouth 2 (two) times daily. 60 tablet 0   Multiple Vitamins-Minerals (MULTIVITAMIN WITH MINERALS) tablet Take 1 tablet by mouth daily.     oxyCODONE 20 MG TABS Take 1 tablet (20 mg total) by mouth every 4 (four) hours as needed (severe pain). (Patient taking differently: Take 20 mg by mouth every 4 (four) hours.) 60 tablet 0   potassium chloride (KLOR-CON) 10 MEQ tablet Take 10 mEq by mouth daily.     PROAIR HFA 108 (90 Base) MCG/ACT inhaler Inhale 2 puffs into the lungs 4 (four) times daily as needed for wheezing or shortness of breath.     triamcinolone ointment (KENALOG) 0.5 % APPLY TO AFFECTED AREA TWICE A DAY 30 g 0   No current facility-administered medications on file prior to visit.    Allergies as of 11/10/2023   (No Known Allergies)     ROS:   General:  No weight loss, Fever, chills  HEENT: No recent headaches, no nasal bleeding, no visual changes, no sore throat  Neurologic: No dizziness, blackouts, seizures. No recent symptoms of stroke or mini- stroke. No recent episodes of slurred speech, or temporary blindness.  Cardiac: No recent episodes of chest pain/pressure, no shortness of breath at rest.  No shortness of breath with exertion.  Denies history of atrial fibrillation or irregular heartbeat  Vascular: No history of rest pain in feet.  No history of claudication.  No history of non-healing ulcer, No history of DVT   Pulmonary: No  home oxygen, no productive cough, no hemoptysis,  No asthma or wheezing  Musculoskeletal:  [x ] Arthritis, [ ]  Low back pain,  [x ] Joint pain  Hematologic:No history of hypercoagulable state.  No history of easy bleeding.  No history of anemia  Gastrointestinal: No hematochezia or melena,  No gastroesophageal reflux, no trouble swallowing  Urinary: [ ]  chronic Kidney disease, [ ]  on HD - [ ]  MWF or [ ]  TTHS, [ ]  Burning with urination, [ ]  Frequent urination, [ ]  Difficulty urinating;   Skin: No rashes  [x]  skin changes  Psychological: No history of anxiety,  No history of depression  Physical Examination  Vitals:   11/10/23 1319  BP: 132/82  Pulse: 81  Resp: (!) 24  Temp: 98.7 F (37.1 C)  TempSrc: Temporal  SpO2: 96%  Weight: 269 lb (122 kg)  Height: 5\' 10"  (1.778 m)    Body mass index is 38.6 kg/m.  General:  Alert and oriented, no acute distress HEENT: Normal Neck: No bruit or JVD Pulmonary: Clear to auscultation bilaterally Cardiac: Regular Rate and Rhythm without murmur Abdomen: Soft, non-tender, non-distended, no mass, no scars Skin: No rash       Extremity Pulses:  radial, femoral,  pulses bilaterally.  Non palpable pedal pulses, doppler DP B  Musculoskeletal: Positive moderate edema  B LE Neurologic: Upper and lower extremity motor grossly and symmetric  DATA: Venous Reflux Times  +--------------+---------+------+-----------+------------+--------+  RIGHT        Reflux NoRefluxReflux TimeDiameter cmsComments                          Yes                                   +--------------+---------+------+-----------+------------+--------+  CFV                    yes   >1 second                       +--------------+---------+------+-----------+------------+--------+  FV mid                  yes   >1 second                       +--------------+---------+------+-----------+------------+--------+  Popliteal               yes   >1 second                       +--------------+---------+------+-----------+------------+--------+  GSV at SFJ              yes    >500 ms      .671              +--------------+---------+------+-----------+------------+--------+  GSV prox thigh          yes    >500 ms      .444              +--------------+---------+------+-----------+------------+--------+  GSV mid thigh           yes    >500 ms      .424              +--------------+---------+------+-----------+------------+--------+  GSV dist thigh          yes    >500 ms      .458              +--------------+---------+------+-----------+------------+--------+  GSV at knee             yes    >500 ms      .485              +--------------+---------+------+-----------+------------+--------+  GSV prox calf           yes    >500 ms      .485              +--------------+---------+------+-----------+------------+--------+  GSV mid calf            yes    >500 ms      .369              +--------------+---------+------+-----------+------------+--------+  SSV Pop Fossa no                            .280              +--------------+---------+------+-----------+------------+--------+  SSV prox calf no                            .246              +--------------+---------+------+-----------+------------+--------+      Summary:  Right:  - No evidence of deep vein thrombosis seen in the right lower extremity,  from the common femoral through the popliteal veins.  - No evidence of superficial venous thrombosis in the right lower  extremity.  - Venous reflux is noted in the right common femoral vein.  - Venous reflux is noted in the right sapheno-femoral junction.  - Venous reflux is noted in the right greater saphenous vein in the  thigh.  - Venous reflux is noted in the right greater saphenous vein in the calf.  - Venous reflux is noted in the right femoral vein.  -  Venous reflux is noted in the right popliteal vein.      Assessment/Plan: PAD with venous reflux He has both Deep and GSV with SFJ reflux long standing.  The GSV is > 0.4 cm in diameter or larger.   He was placed on conservative therapy.  He was measured for thigh high compression, he plans to get these through the [place].  He was given a vein hand our that illustrated elevation, compression and exercise as tolerates.  If possible he would benefit from water exercise that will off load his joints and help with the edema.     He has had several LE surgeries, hip/knee surgeries.  He is not very active and spends most of his days sitting.  He would possibly benefit from intervention to include laser ablation.  I will have him f/u with the vein clinic.  Of note he is on Eliquis for Afib.        Mosetta Pigeon PA-C [facility] [place] Office: [phone]  MD in office [place]

## 2023-11-13 ENCOUNTER — Encounter: Payer: Self-pay | Admitting: Physician Assistant

## 2023-11-23 ENCOUNTER — Other Ambulatory Visit: Payer: Self-pay | Admitting: *Deleted

## 2023-11-23 DIAGNOSIS — I872 Venous insufficiency (chronic) (peripheral): Secondary | ICD-10-CM

## 2024-03-06 ENCOUNTER — Ambulatory Visit (HOSPITAL_COMMUNITY): Payer: Medicaid Other

## 2024-03-06 ENCOUNTER — Ambulatory Visit: Payer: Medicaid Other | Admitting: Vascular Surgery

## 2024-05-06 ENCOUNTER — Other Ambulatory Visit: Payer: Self-pay | Admitting: Internal Medicine
